# Patient Record
Sex: Male | Born: 1945 | Race: White | Hispanic: No | Marital: Single | Smoking: Former smoker
Health system: Southern US, Community
[De-identification: ages and names within clinical notes are randomized; demographics above are authoritative.]

## PROBLEM LIST (undated history)

## (undated) DIAGNOSIS — E119 Type 2 diabetes mellitus without complications: Secondary | ICD-10-CM

## (undated) DIAGNOSIS — F319 Bipolar disorder, unspecified: Secondary | ICD-10-CM

## (undated) DIAGNOSIS — E785 Hyperlipidemia, unspecified: Secondary | ICD-10-CM

## (undated) DIAGNOSIS — M1990 Unspecified osteoarthritis, unspecified site: Secondary | ICD-10-CM

## (undated) DIAGNOSIS — I2510 Atherosclerotic heart disease of native coronary artery without angina pectoris: Secondary | ICD-10-CM

## (undated) DIAGNOSIS — Z955 Presence of coronary angioplasty implant and graft: Secondary | ICD-10-CM

## (undated) DIAGNOSIS — F419 Anxiety disorder, unspecified: Secondary | ICD-10-CM

## (undated) DIAGNOSIS — D649 Anemia, unspecified: Secondary | ICD-10-CM

## (undated) DIAGNOSIS — I10 Essential (primary) hypertension: Secondary | ICD-10-CM

## (undated) DIAGNOSIS — F32A Depression, unspecified: Secondary | ICD-10-CM

## (undated) DIAGNOSIS — R011 Cardiac murmur, unspecified: Secondary | ICD-10-CM

## (undated) DIAGNOSIS — G894 Chronic pain syndrome: Secondary | ICD-10-CM

## (undated) DIAGNOSIS — G4700 Insomnia, unspecified: Secondary | ICD-10-CM

## (undated) DIAGNOSIS — F329 Major depressive disorder, single episode, unspecified: Secondary | ICD-10-CM

## (undated) DIAGNOSIS — K219 Gastro-esophageal reflux disease without esophagitis: Secondary | ICD-10-CM

## (undated) DIAGNOSIS — D509 Iron deficiency anemia, unspecified: Secondary | ICD-10-CM

## (undated) DIAGNOSIS — Z860100 Personal history of colon polyps, unspecified: Secondary | ICD-10-CM

## (undated) DIAGNOSIS — C801 Malignant (primary) neoplasm, unspecified: Secondary | ICD-10-CM

## (undated) DIAGNOSIS — N529 Male erectile dysfunction, unspecified: Secondary | ICD-10-CM

## (undated) DIAGNOSIS — E559 Vitamin D deficiency, unspecified: Secondary | ICD-10-CM

## (undated) DIAGNOSIS — E782 Mixed hyperlipidemia: Secondary | ICD-10-CM

## (undated) HISTORY — DX: Insomnia, unspecified: G47.00

## (undated) HISTORY — DX: Cardiac murmur, unspecified: R01.1

## (undated) HISTORY — DX: Hyperlipidemia, unspecified: E78.5

## (undated) HISTORY — DX: Unspecified osteoarthritis, unspecified site: M19.90

## (undated) HISTORY — DX: Mixed hyperlipidemia: E78.2

## (undated) HISTORY — DX: Anxiety disorder, unspecified: F41.9

## (undated) HISTORY — DX: Presence of coronary angioplasty implant and graft: Z95.5

## (undated) HISTORY — DX: Male erectile dysfunction, unspecified: N52.9

## (undated) HISTORY — DX: Essential (primary) hypertension: I10

## (undated) HISTORY — DX: Personal history of colon polyps, unspecified: Z86.0100

## (undated) HISTORY — DX: Depression, unspecified: F32.A

## (undated) HISTORY — DX: Atherosclerotic heart disease of native coronary artery without angina pectoris: I25.10

## (undated) HISTORY — DX: Gastro-esophageal reflux disease without esophagitis: K21.9

## (undated) HISTORY — DX: Personal history of colon polyps: Z86.010

## (undated) HISTORY — DX: Vitamin D deficiency, unspecified: E55.9

## (undated) HISTORY — DX: Bipolar disorder, unspecified: F31.9

## (undated) HISTORY — PX: CORONARY ANGIOPLASTY WITH STENT PLACEMENT: SHX49

## (undated) HISTORY — DX: Major depressive disorder, single episode, unspecified: F32.9

## (undated) HISTORY — PX: EYE SURGERY: SHX253

## (undated) HISTORY — DX: Chronic pain syndrome: G89.4

## (undated) HISTORY — DX: Anemia, unspecified: D64.9

## (undated) HISTORY — DX: Iron deficiency anemia, unspecified: D50.9

---

## 1997-12-17 ENCOUNTER — Emergency Department (HOSPITAL_COMMUNITY): Admission: EM | Admit: 1997-12-17 | Discharge: 1997-12-17 | Payer: Self-pay | Admitting: Emergency Medicine

## 1997-12-19 ENCOUNTER — Ambulatory Visit (HOSPITAL_COMMUNITY): Admission: RE | Admit: 1997-12-19 | Discharge: 1997-12-19 | Payer: Self-pay

## 2000-11-07 ENCOUNTER — Observation Stay (HOSPITAL_COMMUNITY): Admission: EM | Admit: 2000-11-07 | Discharge: 2000-11-07 | Payer: Self-pay | Admitting: Emergency Medicine

## 2000-11-07 ENCOUNTER — Encounter: Payer: Self-pay | Admitting: Emergency Medicine

## 2006-02-22 HISTORY — PX: CAROTID STENT: SHX1301

## 2006-06-03 ENCOUNTER — Inpatient Hospital Stay (HOSPITAL_COMMUNITY): Admission: RE | Admit: 2006-06-03 | Discharge: 2006-06-05 | Payer: Self-pay | Admitting: Cardiology

## 2008-06-18 ENCOUNTER — Inpatient Hospital Stay (HOSPITAL_COMMUNITY): Admission: EM | Admit: 2008-06-18 | Discharge: 2008-06-19 | Payer: Self-pay | Admitting: Emergency Medicine

## 2008-09-15 ENCOUNTER — Inpatient Hospital Stay (HOSPITAL_COMMUNITY): Admission: EM | Admit: 2008-09-15 | Discharge: 2008-09-18 | Payer: Self-pay | Admitting: Emergency Medicine

## 2008-10-08 ENCOUNTER — Encounter: Admission: RE | Admit: 2008-10-08 | Discharge: 2008-10-08 | Payer: Self-pay | Admitting: Family Medicine

## 2008-12-26 ENCOUNTER — Ambulatory Visit (HOSPITAL_COMMUNITY): Admission: RE | Admit: 2008-12-26 | Discharge: 2008-12-26 | Payer: Self-pay | Admitting: Family Medicine

## 2009-06-25 ENCOUNTER — Encounter: Admission: RE | Admit: 2009-06-25 | Discharge: 2009-06-25 | Payer: Self-pay | Admitting: Family Medicine

## 2010-05-27 LAB — GLUCOSE, CAPILLARY: Glucose-Capillary: 124 mg/dL — ABNORMAL HIGH (ref 70–99)

## 2010-05-31 LAB — BASIC METABOLIC PANEL
BUN: 3 mg/dL — ABNORMAL LOW (ref 6–23)
BUN: 7 mg/dL (ref 6–23)
CO2: 27 mEq/L (ref 19–32)
CO2: 29 mEq/L (ref 19–32)
Calcium: 8.6 mg/dL (ref 8.4–10.5)
Chloride: 111 mEq/L (ref 96–112)
Creatinine, Ser: 0.64 mg/dL (ref 0.4–1.5)
GFR calc Af Amer: >60 mL/min (ref >60)
Glucose, Bld: 103 mg/dL — ABNORMAL HIGH (ref 70–99)
Glucose, Bld: 129 mg/dL — ABNORMAL HIGH (ref 70–99)
Potassium: 3.9 mEq/L (ref 3.5–5.1)
Sodium: 139 mEq/L (ref 135–145)

## 2010-05-31 LAB — COMPREHENSIVE METABOLIC PANEL
AST: 22 U/L (ref 0–37)
Albumin: 3.5 g/dL (ref 3.5–5.2)
Alkaline Phosphatase: 69 U/L (ref 39–117)
Chloride: 106 mEq/L (ref 96–112)
GFR calc Af Amer: >60 mL/min (ref >60)
Potassium: 3.8 mEq/L (ref 3.5–5.1)
Sodium: 138 mEq/L (ref 135–145)
Total Bilirubin: 0.2 mg/dL — ABNORMAL LOW (ref 0.3–1.2)
Total Protein: 6.7 g/dL (ref 6.0–8.3)

## 2010-05-31 LAB — CBC
HCT: 25.2 % — ABNORMAL LOW (ref 39.0–52.0)
Hemoglobin: 11.1 g/dL — ABNORMAL LOW (ref 13.0–17.0)
Hemoglobin: 8.3 g/dL — ABNORMAL LOW (ref 13.0–17.0)
MCHC: 32.7 g/dL (ref 30.0–36.0)
MCHC: 32.9 g/dL (ref 30.0–36.0)
MCHC: 33.6 g/dL (ref 30.0–36.0)
MCV: 79.3 fL (ref 78.0–100.0)
MCV: 80.9 fL (ref 78.0–100.0)
Platelets: 260 10*3/uL (ref 150–400)
Platelets: 299 10*3/uL (ref 150–400)
Platelets: 337 10*3/uL (ref 150–400)
RBC: 4.29 MIL/uL (ref 4.22–5.81)
RDW: 18.9 % — ABNORMAL HIGH (ref 11.5–15.5)
RDW: 19.5 % — ABNORMAL HIGH (ref 11.5–15.5)
RDW: 19.7 % — ABNORMAL HIGH (ref 11.5–15.5)
RDW: 20 % — ABNORMAL HIGH (ref 11.5–15.5)
WBC: 10.6 10*3/uL — ABNORMAL HIGH (ref 4.0–10.5)

## 2010-05-31 LAB — ABO/RH: ABO/RH(D): O POS

## 2010-05-31 LAB — TYPE AND SCREEN: ABO/RH(D): O POS

## 2010-05-31 LAB — URINALYSIS, ROUTINE W REFLEX MICROSCOPIC
Glucose, UA: NEGATIVE mg/dL
Nitrite: NEGATIVE
Protein, ur: NEGATIVE mg/dL
Urobilinogen, UA: 0.2 mg/dL (ref 0.0–1.0)

## 2010-05-31 LAB — HEMOGLOBIN AND HEMATOCRIT, BLOOD: Hemoglobin: 11.1 g/dL — ABNORMAL LOW (ref 13.0–17.0)

## 2010-05-31 LAB — DIFFERENTIAL
Basophils Absolute: 0 10*3/uL (ref 0.0–0.1)
Basophils Relative: 0 % (ref 0–1)
Eosinophils Relative: 1 % (ref 0–5)
Monocytes Absolute: 0.5 10*3/uL (ref 0.1–1.0)
Monocytes Relative: 5 % (ref 3–12)

## 2010-05-31 LAB — CARBAMAZEPINE LEVEL, TOTAL: Carbamazepine Lvl: 6.7 ug/mL (ref 4.0–12.0)

## 2010-06-03 LAB — CBC
HCT: 26.2 % — ABNORMAL LOW (ref 39.0–52.0)
MCHC: 34.3 g/dL (ref 30.0–36.0)
MCHC: 34.8 g/dL (ref 30.0–36.0)
MCV: 83.7 fL (ref 78.0–100.0)
MCV: 85.9 fL (ref 78.0–100.0)
MCV: 86.4 fL (ref 78.0–100.0)
Platelets: 242 10*3/uL (ref 150–400)
Platelets: 317 10*3/uL (ref 150–400)
RBC: 3.38 MIL/uL — ABNORMAL LOW (ref 4.22–5.81)
RDW: 13.7 % (ref 11.5–15.5)
WBC: 7.9 10*3/uL (ref 4.0–10.5)
WBC: 8.2 10*3/uL (ref 4.0–10.5)
WBC: 9.7 10*3/uL (ref 4.0–10.5)

## 2010-06-03 LAB — POCT CARDIAC MARKERS
CKMB, poc: 2 ng/mL (ref 1.0–8.0)
Myoglobin, poc: 69.9 ng/mL (ref 12–200)
Troponin i, poc: <0.05 ng/mL (ref 0.00–0.09)

## 2010-06-03 LAB — HEMOGLOBIN A1C: Hgb A1c MFr Bld: 5.9 % (ref 4.6–6.1)

## 2010-06-03 LAB — COMPREHENSIVE METABOLIC PANEL
AST: 33 U/L (ref 0–37)
Albumin: 3.6 g/dL (ref 3.5–5.2)
Calcium: 8.8 mg/dL (ref 8.4–10.5)
Chloride: 104 mEq/L (ref 96–112)
Creatinine, Ser: 0.76 mg/dL (ref 0.4–1.5)
GFR calc Af Amer: >60 mL/min (ref >60)
Sodium: 137 mEq/L (ref 135–145)

## 2010-06-03 LAB — POCT I-STAT, CHEM 8
BUN: 12 mg/dL (ref 6–23)
Chloride: 105 mEq/L (ref 96–112)
Creatinine, Ser: 0.9 mg/dL (ref 0.4–1.5)
Glucose, Bld: 125 mg/dL — ABNORMAL HIGH (ref 70–99)
Potassium: 3.6 mEq/L (ref 3.5–5.1)

## 2010-06-03 LAB — CARDIAC PANEL(CRET KIN+CKTOT+MB+TROPI)
Relative Index: 2 (ref 0.0–2.5)
Total CK: 128 U/L (ref 7–232)
Troponin I: 0.09 ng/mL — ABNORMAL HIGH (ref 0.00–0.06)

## 2010-06-03 LAB — CK TOTAL AND CKMB (NOT AT ARMC)
CK, MB: 3.6 ng/mL (ref 0.3–4.0)
Relative Index: 1.6 (ref 0.0–2.5)

## 2010-06-03 LAB — DIFFERENTIAL
Eosinophils Relative: 2 % (ref 0–5)
Lymphocytes Relative: 23 % (ref 12–46)
Lymphs Abs: 1.8 10*3/uL (ref 0.7–4.0)
Monocytes Absolute: 0.6 10*3/uL (ref 0.1–1.0)

## 2010-06-03 LAB — BASIC METABOLIC PANEL
BUN: 7 mg/dL (ref 6–23)
CO2: 27 mEq/L (ref 19–32)
Chloride: 104 mEq/L (ref 96–112)
Creatinine, Ser: 0.74 mg/dL (ref 0.4–1.5)
Glucose, Bld: 109 mg/dL — ABNORMAL HIGH (ref 70–99)

## 2010-06-03 LAB — LIPID PANEL
Cholesterol: 178 mg/dL (ref 0–200)
LDL Cholesterol: 92 mg/dL (ref 0–99)
Triglycerides: 217 mg/dL — ABNORMAL HIGH (ref <150)
VLDL: 43 mg/dL — ABNORMAL HIGH (ref 0–40)

## 2010-06-03 LAB — TROPONIN I: Troponin I: 0.01 ng/mL (ref 0.00–0.06)

## 2010-07-07 NOTE — H&P (Signed)
NAME:  Phillip [name], Phillip [name] NO.:  [id number]   MEDICAL RECORD NO.:  [id number]          PATIENT TYPE:  INP   LOCATION:  0104                         FACILITY:  [facility]   PHYSICIAN:  Arne Cleveland, MD       DATE OF BIRTH:  [birth year]   DATE OF ADMISSION:  09/15/2008  DATE OF DISCHARGE:                              HISTORY & PHYSICAL   PRIMARY CARE PHYSICIAN:  Lillia Carmel, MD   CHIEF COMPLAINT:  Nearly passing out today.   HISTORY OF PRESENT ILLNESS:  Phillip [name] is a 65 year old Caucasian male  who went outside today to see what the weather is and felt very weak and  lightheaded.  He went back in, sat down and nearly passed out.  A friend  of his came by and checked his blood pressure, and it was very low.  They called paramedics, and he was brought to the emergency room.  He  had noticed yesterday having 3 episodes of sort of loose, dark bloody  stools.  However, he was waiting until Monday to go see his doctor.  He  had recently been seen and worked up for anemia by his  gastroenterologist, Dr. Bosie Clos, and had an endoscopy and colonoscopy  on September 02, 2008.  The endoscopy was normal, but the colonoscopy showed  that he had some polyps and diverticulosis.  He had some polypectomies  and was sent home.  Results of the polyps according to the patient were  okay, had been doing fine until yesterday.  The reason he had the  endoscopy and colonoscopy was because he was anemic and had very little  energy which was felt to be secondary to his anemia.   PAST MEDICAL HISTORY:  Significant for myocardial infarction.  He has  had 2 coronary stents, also has high cholesterol and chronic obstructive  pulmonary disease as well as the diverticulosis, colon polyps, and  anemia.   PAST SURGICAL HISTORY:  Only significant for the cardiac stents.   FAMILY HISTORY:  Father died of lung cancer at 45.  Mother died of  cancer in her 35s.  He had 4 brothers.  Two are still living.   One  brother died of brain cancer.  The other one died in an accident.  He  has 2 sisters.  One has had lung cancer, so there is a strong family  history of cancer.   SOCIAL HISTORY:  Patient is retired from [employer], educational.  He is divorced.  He has one son.  He has good family support.   REVIEW OF SYSTEMS:  Other than the recent of anemia, for which he had  the workup done and was found to have diverticulosis and colon polyps  and his recent history of coronary artery disease, his review of systems  is negative for any other findings other than those mentioned in history  of present illness.   ALLERGIES:  He has no known drug allergies.   MEDICATIONS:  He is on aspirin, ramipril, Plavix, Trileptal, Lipitor,  Tegretol, isosorbide mononitrate and Vicodin or hydrocodone  acetaminophen tablets  as needed for pain and Atarax for allergies.  The  patient states he take Tegretol for anger management and mood disorder,  and the Trileptal is also for that.  I am not sure of the dosages on  those medicines.   LABORATORIES:  He was typed and screened for 2 units and was started on  1 unit here in the emergency room.  His comprehensive metabolic panel  was normal except for a glucose of 135 mg/dL and total bilirubin of 0.2  which is slightly low, not sure what the significance of that is.  Urinalysis is negative on dipstick.  Complete blood count shows a white  count of 10,600, hemoglobin 8.4, hematocrit 26%.   PHYSICAL EXAMINATION:  GENERAL:  Patient was hypotensive __________ but  was given fluids here in the emergency room.  VITAL SIGNS:  Blood pressure was 102/58, pulse rate 98 and respirations  14.  Repeat vitals showed blood pressure 116/60, pulse 78, respirations  20, pulse oximetry 96%.  HEENT:  Examination of head is atraumatic, normocephalic.  Eyes:  Pupils  are equal, round and reactive to light.  Disks sharp.  Extraocular  muscle range of motion full.  External ear,  ear canal and tympanic  membrane appear normal.  Oropharynx mucous membranes are moist.  No  lesions are seen.  NECK:  Supple without jugular venous distention, thyromegaly or thyroid  mass.  CHEST:  Normal to inspection and palpation.  LUNGS:  Clear to auscultation and percussion.  HEART:  Regular rhythm and rate, normal S1, S2, without murmur, gallop  or rub.  ABDOMEN:  Soft, nontender with normoactive bowel sounds.  No  hepatomegaly, no splenomegaly, no palpable mass.  GENITAL:  Normal.  RECTAL:  Done by ER physician and was dark-colored stool and strongly  heme positive.  EXTREMITIES:  There is no clubbing, cyanosis or edema.  MUSCULOSKELETAL:  Basically normal.  NEUROLOGICAL:  Patient is alert, awake and oriented x3.  Cranial nerves  II-XII are intact.  Motor strength is 5/5 in upper and lower  extremities.  Sensory exam intact to light touch and pinpoint.  SKIN:  No unusual rash or lesions.  LYMPH NODE EXAM:  Cervical, axillary and cervical lymph nodes are  normal.   IMPRESSION:  1. Hypotensive/near-syncope secondary to gastrointestinal blood loss.  2. Gastrointestinal blood loss, most likely lower gastrointestinal      blood loss, either from diverticulosis or from recent polypectomy.  3. Severe anemia with hemoglobin of 8.4 in symptomatic patient.   PLAN:  Hydrate the patient.  Type and cross for 2 units.  Monitor  hemoglobin and hematocrit q.8 h.  Continue the patient on his routine  medications once we know the dosages.  Gastroenterology consult has  already been done by the ER doctor and spoke to the on-call physician  for Dr. Bosie Clos.  So, that has already been taken care of.  The patient  will be admitted to a telemetry bed.  He is being admitted to the C  Team.      Arne Cleveland, MD  Electronically Signed     ML/MEDQ  D:  09/15/2008  T:  09/15/2008  Job:  [id number]   cc:   Lillia Carmel, M.D.  Fax: [phone]

## 2010-07-07 NOTE — Discharge Summary (Signed)
NAME:  YU, PEGGS NO.:  [id number]   MEDICAL RECORD NO.:  [id number]          PATIENT TYPE:  INP   LOCATION:  2505                         FACILITY:  [facility]   PHYSICIAN:  Cristy Hilts. Jacinto Halim, MD       DATE OF BIRTH:  1946/01/24   DATE OF ADMISSION:  06/17/2008  DATE OF DISCHARGE:  06/19/2008                               DISCHARGE SUMMARY   DISCHARGE DIAGNOSES:  1. Unstable angina.  2. Hyperlipidemia.  3. Hypertension.  4. Erectile dysfunction.  5. Anxiety.  6. History of noncompliance with medications.   LABORATORIES:  On the day of discharge; his sodium was 139, potassium  3.9, BUN 7, and creatinine 0.74.  His glucose was 109.  His hemoglobin  was 9.  His hematocrit was 26.2, WBCs 8.2, and platelets 242.  CK-MB and  troponin pre-intervention, negative; post CK-MB 120 8/2.6 and troponin  of 0.03, second CK-MB 103/2.0 and troponin of 0.09.   DISCHARGE MEDICATIONS:  1. Multivitamin everyday.  2. Fish oil everyday.  3. Aspirin 325 mg everyday.  4. Lipitor 80 mg at bedtime.  5. Plavix 75 mg a day.  6. Tegretol 200 mg twice per day.  7. Xanax 0.5 mg twice a day.  8. Hydroxyzine 25 mg as needed.  9. Ramipril 2.5 mg at bedtime.  10.Hydrocodone and acetaminophen as needed.  11.Bystolic 5 mg everyday.  12.Nitroglycerin 1/150 under tongue every 5 minutes x3 when needed.  13.Imdur 30 mg everyday.   He should hold his Viagra, he should not take that any more, to discuss  this with Dr. Jacinto Halim when he returns to see him in the office on Jul 04, 2008.  He should do strenuous activity of lifting, pushing, pulling, or  exercise x1 week.  He should not drive for 1 day.  If has any problems  with his groin, he knows to give our office a call.   HOSPITAL COURSE:  Mr. Lienau is a 65 year old male with prior known  history of coronary artery disease.  In April 2008, he underwent cardiac  catheterization.  He had a Cypher stent placed to his ramus.  He had not  been  following up at the cardiologist's office.  He recently stopped his  Bystolic, which he ran out.  He started having chest pain and his blood  pressure went up, and he came to the emergency room.  He was admitted.  It was decided, he should undergo cardiac catheterization, this was  performed by Dr. Garen Lah on June 18, 2008.  He had a left main with  40% ostial, LAD 60% proximal and 60% mid.  Diagonal-1 and diagonal-2  without significant disease.  Ramus intermedius was 30% in-stent  restenosis proximal, 20% mid left circumflex, 30% proximal.  OM1 with  80% proximal lesion.  RCA 30% proximal lesion and 40% mid lesion.  His  EF was 55%.  Dr. Garen Lah consulted with Dr. Tresa Endo and it was decided  that he should undergo intervention of his OM, but at first he would  need IV ultrasound of his left main, this in  fact did show a 40% plaque.  Please see Dr. Landry Dyke note for complete details.  He went on to have a  2.5 x 13 DES Cypher stent to his left circumflex OM.  The following day  on June 19, 2008, he was seen by Dr. Mariah Milling and considered stable for  discharge home.  He was having some mild chest pain, but his EKG was  unchanged and he was placed on Imdur at the time of discharge.  He was  told not to take any more Viagra.  His blood pressure was 134/62 and  heart rate was 78.  At the time of discharge, he was afebrile.      Lezlie Octave, N.P.      Cristy Hilts. Jacinto Halim, MD  Electronically Signed    BB/MEDQ  D:  06/19/2008  T:  06/20/2008  Job:  [id number]   cc:   Aida Puffer

## 2010-07-07 NOTE — Consult Note (Signed)
NAME:  Phillip [name], Phillip [name] NO.:  [id number]   MEDICAL RECORD NO.:  [id number]          PATIENT TYPE:  INP   LOCATION:  1443                         FACILITY:  [facility]   PHYSICIAN:  Graylin Shiver, M.D.   DATE OF BIRTH:  Jun 26, 1945   DATE OF CONSULTATION:  09/16/2008  DATE OF DISCHARGE:                                 CONSULTATION   REASON FOR CONSULTATION:  The patient is a 65 year old male, status post  colonoscopy on September 02, 2008, with removal of several small 1-5 mm  polyps from the rectum and rectosigmoid region.  He presented to the  emergency room yesterday with a history of rectal bleeding which began  the day before and syncope.  On the day of presentation, his hemoglobin  and hematocrit in the emergency room were 8.4 and 26 respectively.  He  received 1 unit of packed red cells and today his hemoglobin is 8.3 and  25.2.  The patient states that the bleeding is slowing down a lot.  The  patient had been on Plavix due to a history of coronary stents.  He  remain on Plavix for the colonoscopy because his cardiologist said he  could not come off of it because one of the stents was placed within the  past year and because of fear of occluding the stent he remained on  Plavix.  The patient was also found to have diverticulosis during his  colonoscopy.  He also had an EGD which was normal except for a hiatal  hernia.   The patient is currently doing much better and he currently feels fine.  He did have a bowel movement this morning which was a dark maroonish  clotty looking stool, but the marked red bleeding that he had has slowed  down considerably   PAST HISTORY:  History of myocardial infarction.  He has coronary  stents.   SYSTEMS REVIEWED:  Not complaining of any chest pain or shortness of  breath, negative except for above.   ALLERGIES:  NONE KNOWN.   MEDICATIONS PRIOR TO ADMISSION:  Plavix, ramipril, Trileptal, Lipitor,  Tegretol, isosorbide  mononitrate, Vicodin, hydrocodone, acetaminophen,  p.r.n. Atarax.   PHYSICAL EXAMINATION:  GENERAL:  He is alert and oriented.  He is in no  distress.  VITAL SIGNS:  Stable.  HEART:  Regular rhythm.  No murmurs.  LUNGS:  Clear.  ABDOMEN:  Soft, nontender.   IMPRESSION:  Lower gastrointestinal bleeding, probably secondary to post  polypectomy versus diverticular.   PLAN:  Continue to observe, transfuse 2 units of packed red cells today.  Follow H and H.  Hopefully therapeutic intervention will not be needed,  in other words sigmoidoscopy with control of bleeding by injection and  cautery or clipping.  Things seem to be slowing down.  We will observe  him and if therapeutic intervention is needed, we will be available for  that.           ______________________________  Graylin Shiver, M.D.     SFG/MEDQ  D:  09/16/2008  T:  09/16/2008  Job:  [id number]  cc:   [facility]   Shirley Friar, MD  Fax: [phone]

## 2010-07-07 NOTE — Discharge Summary (Signed)
Phillip [name], Phillip [name]                ACCOUNT NO.:  [id number]   MEDICAL RECORD NO.:  [id number]          PATIENT TYPE:  INP   LOCATION:  1443                         FACILITY:  [facility]   PHYSICIAN:  Hillery Aldo, M.D.   DATE OF BIRTH:  1945/08/17   DATE OF ADMISSION:  09/15/2008  DATE OF DISCHARGE:  09/18/2008                               DISCHARGE SUMMARY   PRIMARY CARE PHYSICIAN:  Dr. Saunders Glance Hilts.   DISCHARGE DIAGNOSES:  1. Presyncope.  2. Lower gastrointestinal bleed, status post polypectomy.  3. Coronary artery disease with drug eluting stents  4. Hypertension.  5. Dyslipidemia.  6. Chronic obstructive pulmonary disease.  7. Diverticulosis.  8. History of anemia.  9. Erectile dysfunction.  10.History of medical nonadherence.  11.Right upper lobe and left upper lobe nodules, follow-up      recommended.   DISCHARGE MEDICATIONS:  1. Multivitamin 1 tablet p.o. daily.  2. Fish oil 1000 mg p.o. daily.  3. Aspirin 325 mg p.o. daily.  4. Lipitor 80 mg p.o. q.h.s.  5. Plavix 75 mg p.o. daily.  6. Tegretol 200 mg p.o. b.i.d.  7. Xanax 0.5 mg p.o. b.i.d.  8. Hydroxyzine 25 mg p.o. q.6 h p.r.n. allergies  9. Ramipril 2.5 mg p.o. q.h.s.  10.Bystolic 5 mg p.o. daily.  11.Imdur 30 mg p.o. daily.  12.Nitroglycerin 0.4 mg sublingual q. 5 minutes p.r.n. chest pain.  13.Hydrocodone 5 mg p.o. q.4 h p.r.n. pain.  14.Tylenol 650 mg p.o. q.6 h p.r.n. mild pain or fever.   CONSULTATIONS:  1. Dr. Evette Cristal of gastroenterology.  2. Dr. Sheliah Mends of cardiology.   BRIEF ADMISSION HPI:  The patient is a 65 year old male with known  coronary artery disease who is on aspirin and Plavix secondary to a  history of drug eluting stents, who presented to the hospital with a  presyncopal episode.  Apparently a friend came by and checked his blood  pressure and noted that it was low and subsequently called the  paramedics, where he was brought to the emergency room for evaluation.  He also gave  a history of having three episodes of loose, dark, bloody  stool in the past 24 hours in the setting of a recent GI evaluation done  by Dr. Bosie Clos with both upper and lower endoscopies done on September 02, 2008.  The endoscopy was apparently normal, but the colonoscopy did show  some colonic polyps and diverticulosis and he subsequently had  polypectomy done and was sent home.  Upon initial evaluation in the  emergency department, he was noted to have a hemoglobin of 8.3 and  subsequently was referred to the hospitalist service for further  evaluation and workup.  For full details, please see the dictated report  done by Dr. Shawnie Dapper.   PROCEDURES AND DIAGNOSTIC STUDIES:  1. Acute abdominal series on September 16, 2008 showed no acute or specific      abdominal findings.  Possible small right upper lobe nodule, which      appears to be a new finding.  Probable small left upper lobe  nodule, which was present previously.   DISCHARGE LABORATORY VALUES:  White blood cell count was 7.1, hemoglobin  11.1, hematocrit 33.2, platelets 299.   HOSPITAL COURSE BY PROBLEM:  1. Lower GI bleed, status post polypectomy:  The patient was admitted      and serial hemoglobin and hematocrit checks were done.  Because of      his recent polypectomy, GI consultation was requested and kindly      provided by Dr. Evette Cristal.  The patient was given 2 units of packed red      blood cells and the plan was to perform a sigmoidoscopy if there      was evidence of ongoing bleeding.  The patient's hemoglobin and      hematocrit stabilized after 2 units of packed red blood cells with      no evidence of ongoing bleeding and therefore no further diagnostic      evaluation was done.  He was cleared for discharge by Dr. Evette Cristal and      will remain on aspirin and Plavix.  2. Coronary artery disease with history of drug eluting stents:  The      patient has been maintained on his usual medical therapies,      including aspirin  and Plavix, which recommend continuing.  3. Hypertension:  The patient's blood pressure is well-controlled.  4. Dyslipidemia:  The patient was maintained on statin therapy.  5. Bilateral lung nodules:  The patient had an acute abdominal series      which did show a new small right upper lobe nodule, as well as an      old left upper lobe nodule.  We do recommend follow-up imaging to      ensure that these remain stable.  This can be done by his primary      care physician.   DISPOSITION:  The patient is medically stable and can be discharged  home.  He is encouraged to follow up with Dr. Prince Rome in 1-2 weeks, or as  needed.   Time spent coordinating care for discharge and discharge instructions  equals 25 minutes.      Hillery Aldo, M.D.  Electronically Signed     CR/MEDQ  D:  09/18/2008  T:  09/18/2008  Job:  [id number]   cc:   Lillia Carmel, M.D.  Fax: [phone]

## 2010-07-07 NOTE — Consult Note (Signed)
Phillip [name], Phillip [name]                ACCOUNT NO.:  [id number]   MEDICAL RECORD NO.:  [id number]          PATIENT TYPE:  INP   LOCATION:  1443                         FACILITY:  [facility]   PHYSICIAN:  Sheliah Mends, MD      DATE OF BIRTH:  [birth year]   DATE OF CONSULTATION:  DATE OF DISCHARGE:                                 CONSULTATION   REASON FOR CONSULTATION:  Coronary artery disease.   HISTORY OF PRESENT ILLNESS:  Phillip [name] is a 65 year old gentleman with  history of coronary artery disease.  He presented with acute coronary  syndrome in April 2010 and underwent coronary angiography at that time.  He was found to have a 40% ostial left main, a 60% proximal LAD and a  60% mid-LAD lesion as well as a 40% in-stent restenosis in the ramus  intermedius, a 20% mid left circumflex as well as a 80% proximal obtuse  marginal lesion and a 30% proximal RCA lesion as well as a 40% mid  lesion.  He subsequently underwent drug-eluting stent placement with Dr.  Daphene Jaeger on June 18, 2008, to the obtuse marginal branch.  He did  receive a 2.5 x 13 mm drug-eluting Cypher stent.  The patient was chest  pain free after his stent placement and was started on dual-antiplatelet  therapy with aspirin and Plavix for a minimum of one year.   The patient apparently underwent colonoscopy and endoscopy on September 02, 2008, in context of an anemia workup.  Medical records pertaining to  this procedure are currently unavailable to me. At that time, he  underwent a polypectomy.  It is unclear to me whether he was at that  time on Plavix or whether Plavix was held.  I am unsure whether the  colonoscopy procedure was an elective procedure or an emergent  procedure.   Subsequently, Phillip [name] presented on September 15, 2008, with near syncope,  lightheadedness and weakness.  He noted three episodes of dark bloody  stools and was found to have a hematocrit of 26.  His baseline  hematocrit in April was 40%.  He received  2 units of PRBC on September 16, 2008, and his aspirin and Plavix were held.   From a cardiac standpoint, [name] is currently asymptomatic.  He  denies shortness of breath and chest pain.   PAST MEDICAL HISTORY:  1. Coronary artery disease status post stent placement to the obtuse      marginal branch of the left circumflex in April 2010 and prior      stent placement to the ramus intermedius artery in 2002.  2. Hypertension.  3. Dyslipidemia.  4. History of anemia.  5. Erectile dysfunction.   ALLERGIES:  NO KNOWN DRUG ALLERGIES.   SOCIAL HISTORY:  The patient is divorced, he has one son.  He is retired  and used to work in the tobacco and history.   FAMILY HISTORY:  The patient's father died of lung cancer at age 39.  The patient's mother died of cancer in her 54s.  He has four brothers,  one  died of brain cancer.  The other one died in an accident.  Two  brothers are alive.  He has two sisters and one of them has a history of  lung cancer.   REVIEW OF SYSTEMS:  Review of system is negative from a cardiac  standpoint.   OUTPATIENT MEDICATIONS:  1. Lipitor 80 mg p.o. q.h.s.  2. Plavix 75 mg p.o. daily.  3. Tegretol 2 mg b.i.d.  4. Xanax 0.5 mg p.o. twice daily.  5. Hydroxyzine 25 mg as needed.  6. Ramipril 2.5 mg at bedtime.  7. Hydrocodone and Tylenol as needed.  8. Bystolic 5 mg daily.  9. Nitroglycerin sublingual as needed.  10.Imdur 40 mg p.o. daily.   PHYSICAL EXAMINATION:  GENERAL:  The patient is alert and oriented x3.  VITAL SIGNS:  Blood pressure 147/68, heart rate 72, temperature 97.6,  respiratory rate 16.  NECK:  Supple.  Normal JVP.  No carotid bruit.  CHEST/LUNGS:  Clear to auscultation bilaterally.  No rales or wheezes.  HEART:  Regular rate and rhythm.  No rub, murmur, gallop.  ABDOMEN:  Soft, nontender, nondistended.  Positive bowel sounds.  EXTREMITIES:  No edema.   STUDIES:  EKG shows sinus bradycardia with first degree AV block and  small  nonsignificant Q-waves in 3 and AVR and no significant ST-T  segment changes.   IMPRESSION:  1. Acute GI bleed requiring transfusions of 2 units of PRBC.  2. Coronary artery disease status post drug-eluting stent placement to      large obtuse marginal branch in April 2010.  3. Hypertension.  4. Dyslipidemia.  5. Obesity.   RECOMMENDATIONS:  Phillip [name] is 3 months out from drug eluding stent  placement to large obtuse marginal branch and presents now with a  gastrointestinal bleed after undergoing polypectomies in context of an  anemia workup and colonoscopy.  He was taken off dual antiplatelet  therapy by the admitting physician.  [name] has a significant risk  for in stent thrombosis and should be restarted on aspirin and Plavix at  the earliest possible time point.  He needs to remain on Plavix based on  guidelines for minimum of a year, although one could attempt and  discontinue Plavix 6 months after the stent placement.  For the time  being, it is extremely important to restart the patient on dual  antiplatelet therapy at the earliest possible time point.  In the  meantime, he should receive supportive care for his GI bleed including  transfusions to keep his hematocrit at 30% and above.  Should the  patient develop new onset of cardiac symptoms such as chest pain and  shortness of breath, he will require aggressive management, including  possibly repeat of his invasive workup to check for stent thrombosis.   Thank you for this interesting consult.  Please do not hesitate to  contact me should you have any questions or concerns.  Thank you for  allowing me to assist in the care of this gentleman.      Sheliah Mends, MD  Electronically Signed     JE/MEDQ  D:  09/16/2008  T:  09/17/2008  Job:  [id number]

## 2010-07-07 NOTE — Cardiovascular Report (Signed)
NAME:  Phillip [name], Phillip [name] NO.:  [id number]   MEDICAL RECORD NO.:  [id number]          PATIENT TYPE:  INP   LOCATION:  2505                         FACILITY:  [facility]   PHYSICIAN:  Sheliah Mends, MD      DATE OF BIRTH:  05/22/1945   DATE OF PROCEDURE:  06/18/2008  DATE OF DISCHARGE:                            CARDIAC CATHETERIZATION   INDICATION:  Phillip [name] is a 65 year old gentleman with history of  coronary artery disease, status post stent placement to the ramus  intermedius in 2008 who presented to [facility] with new onset  of chest pain.  His initial evaluation revealed negative point of care  troponins and normal resting EKG.  Given his prior history, clinical  presentation, and multiple risk factors, decision was made to proceed to  cardiac catheterization.   PROCEDURE:  After informed consent was obtained, the patient was brought  to the Second Floor Cardiac Catheterization Lab at [facility]  in a postabsorptive state.  He was draped in sterile fashion and started  on conscious sedation using Versed and fentanyl.  Local anesthesia is  using, 1% Xylocaine was applied to the right groin.  A 5-French arterial  sheath was inserted into the right femoral artery using the modified  Seldinger technique.  Subsequently selective coronary angiography using  the right and left Judkins 5 catheter was performed.  Left  ventriculogram was performed using a pigtail catheter.   FINDINGS:  Left main artery:  The left main artery has a 40% ostial  lesion.  The reminder of the vessel shows mild irregularities.  The left  main trifurcates into the LAD, ramus intermedius, and left circumflex  artery.   LAD:  The left anterior descending artery is a medium caliber, long  vessel.  There is a 60% proximal lesion noted.  In addition, there is a  60% midsegment lesion between the takeoff of the first and second  diagonal artery.  The LAD gives off two medium-sized  diagonal arteries  without significant disease.   Ramus intermedius:  The ramus intermedius is a medium-size vessel.  There is a stent seen in the proximal segment of the ramus intermedius.  There is 40% in-stent restenosis in the proximal segment of stent.  In  addition, there is a 20% lesion seen in the mid segment of the ramus  intermedius.   Left circumflex artery:  The left circumflex artery is a medium-size  vessel.  It gives off a large caliber obtuse marginal branching.  There  is an 80% lesion in the proximal segment of the obtuse marginal 1.   Right coronary artery:  The right coronary artery is a medium-sized  vessel.  It gives off PDA.  There is a 40% proximal lesion and 40%  lesion in the mid segment.  There is no hemodynamic significant lesion  seen.   HEMODYNAMICS:  Left ventricular pressure 94/7, aortic pressure 92/59  mmHg.  There is no significant aortic valve gradient seen.   Left ventriculogram:  The left ventriculogram shows normal left  ventricular function with an ejection fraction of  55%.  There is very  mild inferolateral hypokinesis noted.   SUMMARY:  Multivessel coronary artery disease with mild left main artery  disease, moderate LAD disease and a severe disease in the left  circumflex territory.  The patient will proceed with percutaneous  coronary intervention to  the obtuse marginal branch and intravascular ultrasound.  The diagnostic  portion of the procedure was completed without complication.  Dr. Nicki Guadalajara will perform the interventional part of the procedure.  The  patient will require very aggressive risk factor management.      Sheliah Mends, MD  Electronically Signed     JE/MEDQ  D:  06/18/2008  T:  06/18/2008  Job:  [id number]

## 2010-07-07 NOTE — Cardiovascular Report (Signed)
NAME:  Phillip [name], Phillip [name] NO.:  [id number]   MEDICAL RECORD NO.:  [id number]          PATIENT TYPE:  INP   LOCATION:  2505                         FACILITY:  [facility]   PHYSICIAN:  Nicki Guadalajara, M.D.     DATE OF BIRTH:  1945-09-12   DATE OF PROCEDURE:  DATE OF DISCHARGE:                            CARDIAC CATHETERIZATION   INDICATIONS:  [name] is a 65 year old gentleman who is status  post prior intervention to his ramus intermediate vessel by Dr. Jacinto Halim in  April 2008.  He had presented to [facility] with intermittent  recurrent episodes of chest pain.  Diagnostic cardiac catheterization  was performed this morning by Dr. Gevena Barre, which revealed  progressive CAD with now 80% stenosis in the circumflex marginal vessel.  He also had some moderate LAD disease.  I was asked to evaluate the  patient for consideration of percutaneous coronary intervention of his  circumflex lesion.   PROCEDURE:  The patient's arterial sheath was in place from the  diagnostic procedure.  He received an additional 2 mg of Versed and 25  mg of fentanyl for additional sedation.  Double gloves were used and 5-  [place] arterial sheath was exchanged for a 6-French sheath.  Bivalirudin  was administered for anticoagulation.  The patient was on chronic Plavix  and received an additional 150 mg of oral Plavix in the Laboratory.  Initially, a Voda 4 catheter was inserted, but it was apparent that this  would not be able to engage the left main and this was then removed and  exchanged for an FL-4.  Prior to engaging the left main, due to some  concern of ostial narrowing of the left main seen on my review of the  diagnostic images, I did perform several scout shots to look at the  ostium of the left main without the catheter in place.  This did confirm  an eccentric inferior stenosis.  Consequently, prior to performing  percutaneous coronary prevention, I elected to proceed  with  intravascular ultrasound of the left main to make certain this was not a  significant stenosis and if that was the case the patient would require  a CBG revascularization surgery.  ACT was documented to be therapeutic.  The patient also received IC nitroglycerin.  A wire was advanced down  into the circumflex vessel and the [facility] [employer] intravascular  ultrasound was then inserted.  The left main was IVUS'ed on pullback  which confirmed crescent eccentric plaque in the 9-12 o'clock position  and percent area narrowing was approximately 40%.  It was then felt  suitable to proceed with intervention to the circumflex marginal branch.  The intravascular ultrasound was then removed.  The ATW marker wire was  advanced into the circumflex marginal vessel.  IC nitroglycerin was  administered.  Primary stenting was done with a 2.5 x 13 mm drug-eluting  Cypher stent with 2 dilatations at 13 and 15 atmospheres, respectively.  A 2.75 noncompliant sprinter balloon was used for post-stent dilatation  up to 2.77 mm.  Scout angiography confirmed an excellent angiographic  result.  Since I had already used the intravascular ultrasound and since  the patient did have 2 areas of moderate stenosis in the LAD, I elected  to perform intravascular ultrasound to further evaluate the mid LAD  lesion at the site where 2 diagonals arose as well as the proximal near  ostial LAD lesion.  The ATW wire was then advanced down the LAD.  IC  nitroglycerin was again administered.  The intravascular ultrasound  catheter was then advanced into the mid LAD beyond the diagonal branches  which arose in the region of the stenosis.  A pullback was then  performed.  There was evidence for moderate plaque buildup in the region  of the LAD around the diagonal vessels with narrowing of just less than  60% with percent area narrowing of 50-60%.  The catheter was then pulled  back into the proximal LAD where there was more  concentric stenosis and  this narrowed up to approximately 60% percent area narrowing.  The  patient tolerated the procedure well.  He received additional sedation  during the procedure and also had been started on intravenous  nitroglycerin titrated up to 30 mcg.  The arterial sheath was sutured in  place with plans for sheath removal later today.   HEMODYNAMIC DATA:  Central aortic pressure was 105/67.   ANGIOGRAPHIC FINDINGS:  Please refer to Dr. Gretta Began diagnostic  cardiac catheterization report.  Again, at the start of the  interventional procedure, intravascular ultrasound was performed of the  left main which confirmed crescent-shaped eccentric stenosis in the 9-11  o'clock quadrant as well as ostially in the 3-6 o'clock quadrant.  Percent area stenosis was 40%.   The circumflex vessel had ostial narrowing of 30% proximally.  The OM-1  vessel had 80% narrowing.  Following insertion of a 2.5 x 13 mm drug-  eluting Cypher stent, postdilated to 2.77 mm with an [place] sprinter  balloon.  The 80% stenosis was reduced to 0%.  There was brisk TIMI III  flow.  There was no evidence for dissection.  The mid LAD following  intravascular ultrasound was found to have a 50-60% area of stenosis and  the proximal LAD was found to have a 61% percent area stenosis.   IMPRESSION:  1. Successful percutaneous coronary intervention/stenting of the left      circumflex first obtuse marginal artery vessel with an 80% stenosis      being reduced to 0% done with Angiomax/Plavix/intracoronary and      intravenous nitroglycerin.  2. Intravascular ultrasound confirmation of approximately 40% ostial      left main stenosis; 50% mid left anterior descending stenosis and      60% proximal left anterior descending stenosis.   The patient underwent successful intervention to high-grade circumflex  lesion.  He will be aggressively managed with medical therapy as well as  aggressive lipid therapy with probable  institution of combination  treatment.  He will be followed by Dr. Jacinto Halim with plans for probable  subsequent stress study on medical therapy to ascertain potential LAD  ischemia, aggressive treatment.           ______________________________  Nicki Guadalajara, M.D.     TK/MEDQ  D:  06/18/2008  T:  06/19/2008  Job:  [id number]   cc:   Sheliah Mends, MD  Aida Puffer

## 2010-07-10 NOTE — Discharge Summary (Signed)
[place]. [name]  Patient:    Phillip [name], Phillip [name] Visit Number: [id number] MRN: [id number]          Service Type: MED Location: 2000 2020 01 Attending Physician:  Phillip [name] Dictated by:   Phillip [name], P.A. Admit Date:  11/07/2000 Discharge Date: 11/07/2000   CC:         Phillip [name], M.D., [facility]. [facility]  Bruce Elvera Lennox Phillip [name], M.D. [facility]   Discharge Summary  DISCHARGE DIAGNOSES: 1. ______ status post cardiac catheterization. 2. Guaiac positive stools. 3. Anxiety/depression. 4. Tobacco abuse.  HOSPITAL COURSE:  Phillip [name] is a 65 year old male with no prior cardiac history. He presented to the emergency room at [name] reporting several weeks of progressive chest pain. While at work on the day of admission, the patient developed dull anterior chest pull which he rated at 8/10. There was no radiation of the pain and there was no question about whether it was associated with increased shortness of breath. There was no associated nausea, vomiting or diaphoresis. The pain lasted approximately 15 minutes. He was seen by the nurse at work who drove him to [facility]. He was seen and admitted by Dr. Olga [name]. Dr. Jens [name] noted that the patients troponin I was elevated to 0.48 and felt that the best course of action would be to have the patient undergo cardiac catheterization.  On that day, the patient was taken to the cath lab by Dr. Charlies [name]. CATHETERIZATION RESULTS: 1. Left anterior descending coronary artery: Normal. 2. Circumflex system:  A 60% lesion of the ramus, 40% distal lesion. 3. Right coronary artery normal. 4. Left ventricle:  Question of mild global hypokinesis, ejection fraction    approximately 50%.  Dr. Juanda [name] felt that was no clear source for patient for ischemia. He also felt that troponin was probably falsely elevated. He recommended the patient go home on increased Zantac as well as daily low dose  aspirin. After the appropriate wait period, the patient was felt to be stable for discharge.  DISCHARGE MEDICATIONS: 1. Paxil 20 mg q.d. 2. Zantac 150 mg b.i.d. 3. Benadryl as needed. 4. Enteric coated aspirin 81 mg q.d.  DISCHARGE INSTRUCTIONS: 1. The patient is advised to avoid driving, heavy lifting or tub baths    for two days. 2. He was instructed to follow a low fat diet. 3. He is advised to watch the cath site for any pain, bleeding, or swelling    and to call the [facility] for any of these problems. 4. He is advised to stop smoking. 5. He is to contact [facility] [name] for an appointment within the next 10 days    for follow-up on heme positive stool as well as a check of the patients    cholesterol.  LABORATORY DATA:  Sodium 139, potassium 4.5, chloride 107, CO2 26, BUN 12 and 0.8. Total CK 74, MB 1.2, troponin I 0.48. White count 9.9, hemoglobin 12.3, hematocrit 36.3, MCV 78.1, RDW 15.6, platelets 368.  Electrocardiogram showed normal sinus rhythm at 78 with a normal axis. There were also noted to be some signs of early repolarization. Dictated by:   Phillip [name], P.A. Attending Physician:  Phillip [name] DD:  11/07/00 TD:  11/08/00 Job: [id number]

## 2010-07-10 NOTE — Cardiovascular Report (Signed)
[place]. [facility]  Patient:    Phillip [name], Phillip [name] Visit Number: [id number] MRN: [id number]          Service Type: MED Location: 2000 2020 01 Attending Physician:  Glennon Hamilton Dictated by:   Everardo Beals Juanda Chance, M.D. [facility] Proc. Date: 11/07/00 Admit Date:  11/07/2000   CC:         Buren Kos, M.D.  Madolyn Frieze Jens Som, M.D. 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  He does have a long history of smoking and an alcohol history.  He came to the emergency room today complaining of left-sided throbbing chest pain lasting about 15 minutes.  His troponin was positive at 0.4 and he was seen in consultation by Dr. Jens Som who arranged for him to come in the hospital for evaluation and catheterization.  He did have a guaiac-positive stool.  DESCRIPTION OF PROCEDURE:  The procedure was performed via the right femoral artery using an arterial sheath and 6-French preformed coronary catheters.  A front wall arterial puncture was performed.  Omnipaque contrast was used.  The right femoral artery was closed with Perclose at the end of the procedure. The patient tolerated the procedure well and left the laboratory in satisfactory condition.  RESULTS: 1. The left main coronary artery:  The left main coronary artery was free of    significant disease. 2. The left anterior descending artery:  The left anterior    descending artery gave rise to a septal perforator, two diagonal branches,    and two more septal perforators.  These and the LAD proper were free of    significant disease. 3. The circumflex artery:  The circumflex artery gave rise to a large    intermedius branch, an atrial branch, two small marginal branches, and two    posterolateral branches.  There was 60-70% narrowing in the proximal    portion of the intermedius branch.   There was 40% narrowing in the distal    circumflex artery. 4. The right coronary artery:  The right coronary artery is a moderate sized    vessel that gave rise to a conus branch, a right ventricular branch, a    posterior descending branch, and a posterolateral branch.  There were some    irregularities in the right coronary artery with no significant blockage.  Left ventriculogram:  The left ventriculogram, performed in the RAO projection, showed questionable mild global hypokinesis.  The estimated ejection fraction was 60%.  There were no focal wall motion abnormalities.  The aortic pressure was 115/51 with a mean of 71.  Left ventricular pressure was 115/10.  CONCLUSION:  Nonobstructive coronary artery disease with 60-70% stenosis in the intermedius branch of the circumflex artery, 40% narrowing in the distal circumflex artery, and irregularities in the right coronary artery with questionable mild global hypokinesis.   RECOMMENDATIONS:  There was no source of ischemia.  In view of these findings, I think the abnormal troponin is probably a false positive value.  I discussed the situation with Dr. Jens Som, and we think we can let him go home today. He does have positive stools, and I will try and speak with Dr. Windle Guard to have him follow up with him and he can decide about further evaluation. Dictated by:   Everardo Beals Juanda Chance, M.D. [facility] Attending Physician:  Glennon Hamilton DD:  11/07/00 TD:  11/07/00 Job: [id number]

## 2010-07-10 NOTE — Cardiovascular Report (Signed)
NAME:  Phillip [name], Phillip [name] NO.:  [id number]   MEDICAL RECORD NO.:  [id number]          PATIENT TYPE:  OIB   LOCATION:  2807                         FACILITY:  [facility]   PHYSICIAN:  Cristy Hilts. Jacinto Halim, MD       DATE OF BIRTH:  Aug 08, 1945   DATE OF PROCEDURE:  06/03/2006  DATE OF DISCHARGE:                            CARDIAC CATHETERIZATION   REFERRING PHYSICIAN:  Dr. Aida Puffer   PROCEDURE PERFORMED:  1. Left ventriculography.  2. Selective right and left coronary arteriography.  3. Left subclavian arteriography with visualization of LIMA.  4. Intravascular ultrasound-guided interrogation and PTCA and stenting      of the ramus intermediate branch.   INDICATIONS:  Mr. Phillip [name] is a 65 year old gentleman with history  of tobacco use who has been complaining of exertional chest discomfort.  He had undergone a CT angiography of the chest on May 19, 2006, and  this had revealed a high-grade proximal LAD stenosis.  Given his ongoing  chest pain in spite of being on medical therapy, we decided to bring him  to the cardiac catheterization laboratory for definite delineation of  his coronary anatomy.   HEMODYNAMIC DATA:  The left ventricular pressure 118/2 with an end-  diastolic pressure of 6 mmHg.  The aortic pressures were 116/67 with a  mean of 88 mmHg.  There was no pressure gradient across the aortic  valve.   ANGIOGRAPHIC DATA:  Left ventricle:  Left ventricular systolic function  was normal with ejection fraction of 50-55%.  There was mild global  hypokinesis.   Right coronary artery:  Right coronary artery is a large caliber vessel  and a dominant vessel.  There is a proximal 30% and a mid 40% eccentric  stenosis.  The RCA continues as a PDA.   Left main:  The left main is a large caliber vessel.  There is an ostial  10% stenosis.  There is mild calcification noted.   Circumflex:  Circumflex is a moderate caliber vessel.  It has got mild  diffuse luminal  irregularity.  The mid to distal segment has a 30%  stenosis.   Ramus intermediate:  The ramus intermediate is a large caliber vessel.  It has a got a very highly complex ostial long segment 90% stenosis.  Otherwise, the ramus intermediate is a smooth vessel distally.   LAD:  The LAD is a large caliber vessel.  It gives origin to a small- to  moderate-sized diagonal one and diagonal two from the mid segments.  At  this segment there is a 50% stenosis in the LAD.   Left subclavian artery and LIMA:  Left subclavian artery and LIMA widely  patent.   INTERVENTION DATA:  Successful intravascular ultrasound-guided  interrogation of the ramus intermediate branch of the left coronary  artery with implantation of a 3.0 x 28-mm Cypher stent which was  deployed at 12 atmospheres of pressure.  This stent was postdilated with  a 3.25 x 20-mm Quantum at 20 atmospheres of pressure.  Post balloon  angioplasty IVUS interrogation revealed excellent wall opposition.  There  was excellent capture of the ostium of the ramus intermediate.   RECOMMENDATIONS:  The patient had has a successful PTCA and stenting of  a high-grade highly complex ramus intermediate branch of the left  coronary system.  He needs very aggressive risk modification.  He has  severe plaque burden of the right coronary artery and also of the  circumflex and LAD.  He needs to be on aggressive lipid-lowering therapy  along with aspirin.  He needs Plavix at least for a period of 2 years or  probably much longer.   A total of 280 mL of contrast was utilized for diagnostic angiography.   TECHNIQUE OF THE DIAGNOSTIC CARDIAC CATHETERIZATION:  Under usual  sterile precautions using a 6-French right femoral arterial access, a 6-  [place] multipurpose B2 catheter was utilized to perform left  ventriculography and angiography of the right coronary artery and left  main coronary artery.  Hemodynamics across the aortic valve were also   monitored.  The same catheter was utilized in the left subclavian artery  and left subclavian arteriography with visualization of the LIMA was  performed.  Then the catheter was pulled out of body.   TECHNIQUE OF INTERVENTION:  Using Angiomax for anticoagulation and using  a 0.014-inch ATW guidewire and using Atlantis intravascular ultrasound  catheter, careful IVUS interrogation of the ramus intermediate was  performed.  The lesion length and morphology was carefully analyzed.  There was a high-grade stenosis.  The lesion was abutting the IVUS  catheter.  Then a 3.0 x 15-mm cutting balloon was utilized and multiple  cuts were made to the ramus intermediate at 6-10 atmospheres of pressure  from 45-60 seconds.  I decided to proceed with stenting because of  residual stenosis and a 3.0 x 28-mm Cypher stent was advanced into the  ramus intermediate and the stent was deployed at 16 atmospheres of  pressure for 52 seconds.  After stent implantation, a 3.25 x 20-mm  Quantum balloon was utilized and balloon angioplasty at 12 and 14  atmospheres of pressures were performed for 30 seconds.  Post balloon  angioplasty IVUS interrogation revealed inadequate opposition,  especially in the mid segment of the vessel.  I went back with the same  balloon and a second two inflations at 20 atmospheres of pressure for 40  seconds each was performed.  Post balloon angioplasty again IVUS  interrogation was performed, revealing excellent results.  Then the  guidewire was withdrawn, angiography repeated, guide catheter pulled out  of body.  During the procedure, intracoronary nitroglycerin was also  administered.  The patient tolerated the procedure well.  He did have  chest pain without any EKG changes.      Cristy Hilts. Jacinto Halim, MD  Electronically Signed     JRG/MEDQ  D:  06/03/2006  T:  06/03/2006  Job:  [id number]   cc:   Aida Puffer

## 2010-07-10 NOTE — Discharge Summary (Signed)
NAME:  Phillip [name], Phillip [name] NO.:  [id number]   MEDICAL RECORD NO.:  [id number]          PATIENT TYPE:  OIB   LOCATION:  2005                         FACILITY:  [facility]   PHYSICIAN:  Lezlie Octave, N.P.     DATE OF BIRTH:  1945-10-10   DATE OF ADMISSION:  06/03/2006  DATE OF DISCHARGE:  06/05/2006                               DISCHARGE SUMMARY   SUBJECTIVE:  Mr. Schildt is a 65 year old male patient of Dr. Yates Decamp,  who he had seen as an outpatient with complaints of chest pain.  He  apparently also had undergone a cardiac CT angiography on May 19, 2006  and he had a high-grade proximal LAD stenosis.  In the office Dr. Jacinto Halim  placed him on Coreg and aspirin and recommended cardiac catheterization  thus, he came in on June 03, 2006 for elective cardiac catheterization.  He was found to have a high-grade ramos lesion, 90%, he underwent  stenting with a 3.0 x 28 Cypher stent.  He had residual disease, 50% in  his LAD mid.  30-40% lesions in his RCA, and a 30% lesion in his  circumflex, his EF was 55%, his LIMA was widely patent.   POST PROCEDURE:  His enzymes are slightly positive for a small procedure  on non-ST elevation MI, thus he was kept in the hospital an extra day.  On June 05, 2006 he was seen by Dr. Jacinto Halim, considered stable for  discharge home.  He added ramipril 2.5 mg at bedtime.  He had been  started on Imdur on IV in the evening of June 04, 2006 secondary to  having another bout of chest pain.  It is known from his outpatient  medication list he takes Levitra p.r.n.  he was told not to take Levitra  or any other ED medications while he is on Imdur and if he has chest  pain after he takes them, he should not use any nitroglycerin  sublingual.  The patient understood.   LABS:  Hemoglobin 11.7 and hematocrit 35.2, WBCs 8.8, and platelets 288,  sodium 139, potassium 3.8, glucose was 96, his BUN was 7, creatinine  0.85.  CK-MB:  1. 160/13.9 with a  troponin of 0.78.  2. 209/19.7 with troponin of 3.54.  3. 160/11.9 with a troponin of 3.31.  4. 146/9.3 with a troponin of 2.84.  Hemoglobin A1c was 6.4.  No chest x-ray done at this admission at least  nothing in the computer.   DISCHARGE MEDICATIONS:  1. Aspirin 81 mg a day.  2. Plavix 75 mg a day.  3. He was told not to stop Lipitor 80 mg a day.  4. Carvedilol 6.25 mg twice a day.  5. Paxil 20 mg a day.  6. Imdur 30 mg a day.  7. Nitroglycerin 150 sublingual every 5 minutes p.r.n. for chest pain.  8. Ramipril 2.5 mg at bedtime every night.  9. He should not use Levitra or any other ED medication while he is on      Imdur and if he takes any sublingual nitroglycerin.  DISCHARGE INSTRUCTIONS:  1. He should do no strenuous activity for a week.  2. He should defer driving for 2 days.  3. He should see Dr. Aida Puffer back about his elevated hemoglobin      A1c at 6.4.  4. He will follow up with Dr. Jacinto Halim in 2 weeks.   DISCHARGE DIAGNOSES:  1. Unstable angina and positive cardiac CT angiography revealing a      high-grade proximal LAD stenosis.  2. Arthrosclerotic cardiovascular disease status post elective cardiac      catheterization showing high-grade ramos stenosis 90% with a      subsequent Cypher stenting with some residual disease in his left      anterior descending artery circumflex and right coronary artery      which are all non obstructive.  3. Normal ejection fraction 55%.  4. Non ST elevation myocardial infarction.  5. Elevated hemoglobin A1c at 6.4.  6. Hyperlipidemia.  7. Hypertension.  8. Daily alcohol.  The patient requests something for anxiety at the      time of discharge, he stated Dr. Jacinto Halim told him that he would give      him something however, the patient does not want to give up his      drinking behaviors, so it was decided between the patient and      myself not to give him any anxiety medication.      Lezlie Octave, N.P.     BB/MEDQ  D:   06/05/2006  T:  06/05/2006  Job:  [id number]

## 2011-11-16 ENCOUNTER — Ambulatory Visit (HOSPITAL_COMMUNITY)
Admission: RE | Admit: 2011-11-16 | Discharge: 2011-11-16 | Disposition: A | Discharge status: Home / Self Care | Payer: Medicare Other | Source: Ambulatory Visit | Attending: Family Medicine | Admitting: Family Medicine

## 2011-11-16 DIAGNOSIS — E7800 Pure hypercholesterolemia, unspecified: Secondary | ICD-10-CM | POA: Insufficient documentation

## 2011-11-16 DIAGNOSIS — I6529 Occlusion and stenosis of unspecified carotid artery: Secondary | ICD-10-CM | POA: Insufficient documentation

## 2011-11-16 DIAGNOSIS — R0989 Other specified symptoms and signs involving the circulatory and respiratory systems: Secondary | ICD-10-CM

## 2011-11-16 DIAGNOSIS — I779 Disorder of arteries and arterioles, unspecified: Secondary | ICD-10-CM

## 2011-11-16 NOTE — Progress Notes (Signed)
VASCULAR LAB PRELIMINARY  PRELIMINARY  PRELIMINARY  PRELIMINARY  Carotid duplex  completed.    Preliminary report:  Bilateral:  No evidence of hemodynamically significant internal carotid artery stenosis.   Vertebral artery flow is antegrade.      Jahlani Lorentz, RVT 11/16/2011, 9:41 AM

## 2012-09-18 ENCOUNTER — Other Ambulatory Visit: Payer: Self-pay | Admitting: *Deleted

## 2012-09-18 MED ORDER — FOLIC ACID 1 MG PO TABS: 1.0000 mg | ORAL_TABLET | Freq: Every day | ORAL | Status: DC

## 2012-10-19 ENCOUNTER — Other Ambulatory Visit: Payer: Self-pay | Admitting: Cardiovascular Disease

## 2012-10-19 NOTE — Telephone Encounter (Signed)
Rx was sent to pharmacy electronically. 

## 2012-12-04 ENCOUNTER — Encounter: Payer: Self-pay | Admitting: Internal Medicine

## 2013-01-08 ENCOUNTER — Ambulatory Visit: Payer: Medicare Other | Admitting: Internal Medicine

## 2013-01-12 ENCOUNTER — Ambulatory Visit: Payer: Medicare Other | Admitting: Nurse Practitioner

## 2013-01-16 ENCOUNTER — Other Ambulatory Visit (INDEPENDENT_AMBULATORY_CARE_PROVIDER_SITE_OTHER): Payer: Medicare Other

## 2013-01-16 ENCOUNTER — Encounter: Payer: Self-pay | Admitting: Internal Medicine

## 2013-01-16 ENCOUNTER — Ambulatory Visit (INDEPENDENT_AMBULATORY_CARE_PROVIDER_SITE_OTHER): Payer: Medicare Other | Admitting: Internal Medicine

## 2013-01-16 ENCOUNTER — Telehealth: Payer: Self-pay

## 2013-01-16 VITALS — BP 130/78 | HR 77 | Temp 98.3°F | Ht 71.0 in | Wt 164.0 lb

## 2013-01-16 DIAGNOSIS — I10 Essential (primary) hypertension: Secondary | ICD-10-CM

## 2013-01-16 DIAGNOSIS — R21 Rash and other nonspecific skin eruption: Secondary | ICD-10-CM

## 2013-01-16 DIAGNOSIS — E785 Hyperlipidemia, unspecified: Secondary | ICD-10-CM

## 2013-01-16 DIAGNOSIS — F329 Major depressive disorder, single episode, unspecified: Secondary | ICD-10-CM | POA: Insufficient documentation

## 2013-01-16 DIAGNOSIS — N320 Bladder-neck obstruction: Secondary | ICD-10-CM

## 2013-01-16 DIAGNOSIS — Z23 Encounter for immunization: Secondary | ICD-10-CM

## 2013-01-16 DIAGNOSIS — Z0001 Encounter for general adult medical examination with abnormal findings: Secondary | ICD-10-CM | POA: Insufficient documentation

## 2013-01-16 DIAGNOSIS — Z86010 Personal history of colonic polyps: Secondary | ICD-10-CM | POA: Insufficient documentation

## 2013-01-16 DIAGNOSIS — F319 Bipolar disorder, unspecified: Secondary | ICD-10-CM

## 2013-01-16 DIAGNOSIS — H9190 Unspecified hearing loss, unspecified ear: Secondary | ICD-10-CM

## 2013-01-16 DIAGNOSIS — G4700 Insomnia, unspecified: Secondary | ICD-10-CM | POA: Insufficient documentation

## 2013-01-16 DIAGNOSIS — I2510 Atherosclerotic heart disease of native coronary artery without angina pectoris: Secondary | ICD-10-CM | POA: Insufficient documentation

## 2013-01-16 DIAGNOSIS — K219 Gastro-esophageal reflux disease without esophagitis: Secondary | ICD-10-CM | POA: Insufficient documentation

## 2013-01-16 DIAGNOSIS — D509 Iron deficiency anemia, unspecified: Secondary | ICD-10-CM | POA: Insufficient documentation

## 2013-01-16 DIAGNOSIS — F32A Depression, unspecified: Secondary | ICD-10-CM | POA: Insufficient documentation

## 2013-01-16 DIAGNOSIS — F419 Anxiety disorder, unspecified: Secondary | ICD-10-CM | POA: Insufficient documentation

## 2013-01-16 DIAGNOSIS — G894 Chronic pain syndrome: Secondary | ICD-10-CM | POA: Insufficient documentation

## 2013-01-16 DIAGNOSIS — Z0000 Encounter for general adult medical examination without abnormal findings: Secondary | ICD-10-CM | POA: Insufficient documentation

## 2013-01-16 DIAGNOSIS — M1990 Unspecified osteoarthritis, unspecified site: Secondary | ICD-10-CM | POA: Insufficient documentation

## 2013-01-16 DIAGNOSIS — H9193 Unspecified hearing loss, bilateral: Secondary | ICD-10-CM

## 2013-01-16 DIAGNOSIS — Z955 Presence of coronary angioplasty implant and graft: Secondary | ICD-10-CM

## 2013-01-16 HISTORY — DX: Presence of coronary angioplasty implant and graft: Z95.5

## 2013-01-16 HISTORY — DX: Iron deficiency anemia, unspecified: D50.9

## 2013-01-16 LAB — URINALYSIS, ROUTINE W REFLEX MICROSCOPIC
Hgb urine dipstick: NEGATIVE
Leukocytes, UA: NEGATIVE
Nitrite: NEGATIVE
RBC / HPF: NONE SEEN (ref 0–?)
Specific Gravity, Urine: 1.025 (ref 1.000–1.030)
Urine Glucose: NEGATIVE
Urobilinogen, UA: 0.2 (ref 0.0–1.0)
WBC, UA: NONE SEEN (ref 0–?)

## 2013-01-16 LAB — CBC WITH DIFFERENTIAL/PLATELET
Basophils Absolute: 0 10*3/uL (ref 0.0–0.1)
Eosinophils Absolute: 0.1 10*3/uL (ref 0.0–0.7)
HCT: 34.8 % — ABNORMAL LOW (ref 39.0–52.0)
Hemoglobin: 12 g/dL — ABNORMAL LOW (ref 13.0–17.0)
Lymphocytes Relative: 14.9 % (ref 12.0–46.0)
Monocytes Relative: 7.6 % (ref 3.0–12.0)
Neutro Abs: 5.8 10*3/uL (ref 1.4–7.7)
Neutrophils Relative %: 75.4 % (ref 43.0–77.0)
RBC: 4.03 Mil/uL — ABNORMAL LOW (ref 4.22–5.81)
RDW: 13.6 % (ref 11.5–14.6)

## 2013-01-16 LAB — HEPATIC FUNCTION PANEL
ALT: 15 U/L (ref 0–53)
AST: 16 U/L (ref 0–37)
Albumin: 3.9 g/dL (ref 3.5–5.2)
Alkaline Phosphatase: 75 U/L (ref 39–117)
Bilirubin, Direct: 0 mg/dL (ref 0.0–0.3)
Total Protein: 7.8 g/dL (ref 6.0–8.3)

## 2013-01-16 LAB — BASIC METABOLIC PANEL
CO2: 29 mEq/L (ref 19–32)
Calcium: 9.4 mg/dL (ref 8.4–10.5)
Chloride: 98 mEq/L (ref 96–112)
Creatinine, Ser: 0.7 mg/dL (ref 0.4–1.5)
Glucose, Bld: 106 mg/dL — ABNORMAL HIGH (ref 70–99)
Potassium: 4.6 mEq/L (ref 3.5–5.1)

## 2013-01-16 LAB — LIPID PANEL
Cholesterol: 226 mg/dL — ABNORMAL HIGH (ref 0–200)
Total CHOL/HDL Ratio: 5
Triglycerides: 230 mg/dL — ABNORMAL HIGH (ref 0.0–149.0)
VLDL: 46 mg/dL — ABNORMAL HIGH (ref 0.0–40.0)

## 2013-01-16 LAB — LDL CHOLESTEROL, DIRECT: Direct LDL: 142.5 mg/dL

## 2013-01-16 MED ORDER — HYDROXYZINE HCL 25 MG PO TABS: 25.0000 mg | ORAL_TABLET | Freq: Four times a day (QID) | ORAL | Status: DC | PRN

## 2013-01-16 MED ORDER — SILDENAFIL CITRATE 20 MG PO TABS: 20.0000 mg | ORAL_TABLET | Freq: Two times a day (BID) | ORAL | Status: DC | PRN

## 2013-01-16 MED ORDER — PANTOPRAZOLE SODIUM 40 MG PO TBEC: 40.0000 mg | DELAYED_RELEASE_TABLET | Freq: Two times a day (BID) | ORAL | Status: DC

## 2013-01-16 MED ORDER — HYDROCODONE-ACETAMINOPHEN 5-325 MG PO TABS: 1.0000 | ORAL_TABLET | Freq: Four times a day (QID) | ORAL | Status: DC | PRN

## 2013-01-16 MED ORDER — LIDOCAINE 5 % EX PTCH: 1.0000 | MEDICATED_PATCH | CUTANEOUS | Status: DC

## 2013-01-16 MED ORDER — ALPRAZOLAM 1 MG PO TABS: ORAL_TABLET | ORAL | Status: DC

## 2013-01-16 MED ORDER — RAMIPRIL 2.5 MG PO CAPS: 2.5000 mg | ORAL_CAPSULE | Freq: Every day | ORAL | Status: DC

## 2013-01-16 MED ORDER — CARBAMAZEPINE 200 MG PO TABS: 200.0000 mg | ORAL_TABLET | Freq: Two times a day (BID) | ORAL | Status: DC

## 2013-01-16 MED ORDER — ROSUVASTATIN CALCIUM 5 MG PO TABS: 5.0000 mg | ORAL_TABLET | Freq: Every day | ORAL | Status: DC

## 2013-01-16 MED ORDER — CLOBETASOL PROPIONATE 0.05 % EX CREA: 1.0000 "application " | TOPICAL_CREAM | Freq: Two times a day (BID) | CUTANEOUS | Status: DC

## 2013-01-16 MED ORDER — ZOLPIDEM TARTRATE 10 MG PO TABS: 10.0000 mg | ORAL_TABLET | Freq: Every evening | ORAL | Status: DC | PRN

## 2013-01-16 MED ORDER — FENOFIBRATE 54 MG PO TABS: ORAL_TABLET | ORAL | Status: DC

## 2013-01-16 MED ORDER — CARVEDILOL 25 MG PO TABS: ORAL_TABLET | ORAL | Status: DC

## 2013-01-16 MED ORDER — FOLIC ACID 1 MG PO TABS: 1.0000 mg | ORAL_TABLET | Freq: Every day | ORAL | Status: DC

## 2013-01-16 MED ORDER — CLOPIDOGREL BISULFATE 75 MG PO TABS: 75.0000 mg | ORAL_TABLET | Freq: Once | ORAL | Status: DC

## 2013-01-16 NOTE — Patient Instructions (Addendum)
Please sign the release of information for [facility] - Dr Prince Rome, and [facility] You had the flu shot today Please return in 2 wks for a Nurse Visit for the new Prevnar pneumonia shot Please continue all other medications as before, and refills have been done if requested. Please have the pharmacy call with any other refills you may need. Please continue your efforts at being more active, low cholesterol diet, and weight control. You are otherwise up to date with prevention measures today. Please go to the LAB in the Basement (turn left off the elevator) for the tests to be done today You will be contacted by phone if any changes need to be made immediately.  Otherwise, you will receive a letter about your results with an explanation, but please check with MyChart first.  Please remember to sign up for My Chart if you have not done so, as this will be important to you in the future with finding out test results, communicating by private email, and scheduling acute appointments online when needed.  You are given the letter today explaining the transitional pain medication refill policy  Please be aware that I will no longer be able to offer monthly refills of any Schedule II or higher medication starting Mar 25, 2013  Please return in 6 months, or sooner if needed

## 2013-01-16 NOTE — Assessment & Plan Note (Signed)
Lab Results  Component Value Date   LDLCALC  Value: 92        Total Cholesterol/HDL:CHD Risk Coronary Heart Disease Risk Table                     Men   Women  1/2 Average Risk   3.4   3.3  Average Risk       5.0   4.4  2 X Average Risk   9.6   7.1  3 X Average Risk  23.4   11.0        Use the calculated Patient Ratio above and the CHD Risk Table to determine the patient's CHD Risk.        ATP III CLASSIFICATION (LDL):  <100     mg/dL   Optimal  098-119  mg/dL   Near or Above                    Optimal  130-159  mg/dL   Borderline  147-829  mg/dL   High  >562     mg/dL   Very High 03/24/8655  for f/u labs today, cont diet, goal ldl < 70

## 2013-01-16 NOTE — Assessment & Plan Note (Signed)
stable overall by history and exam, recent data reviewed with pt, and pt to continue medical treatment as before,  to f/u any worsening symptoms or concerns BP Readings from Last 3 Encounters:  01/16/13 130/78

## 2013-01-16 NOTE — Telephone Encounter (Signed)
Called left message to call back 

## 2013-01-16 NOTE — Progress Notes (Signed)
Pre-visit discussion using our clinic review tool. No additional management support is needed unless otherwise documented below in the visit note.  

## 2013-01-16 NOTE — Assessment & Plan Note (Addendum)
Ok for refills, to obtain prior records, also let pt know I will no longer be able to participate in chronic pain management with sched II or higher meds after feb 1 due to recent change in [place] law and more stringent [place] [employer] regulations  Note:  Total time for pt hx, exam, review of record with pt in the room, determination of diagnoses and plan for further eval and tx is > 60 min, with over 50% spent in coordination and counseling of patient

## 2013-01-16 NOTE — Telephone Encounter (Signed)
This was not actually addressed during his visit; please call to confirm reason for the sildenafil;  (The PA will not likely be approved if the reason is for ED)

## 2013-01-16 NOTE — Assessment & Plan Note (Signed)
Controlled now, etiology unclear, to refill clobetasol prn use

## 2013-01-16 NOTE — Assessment & Plan Note (Signed)
Also for psa as he is due 

## 2013-01-16 NOTE — Assessment & Plan Note (Signed)
Due to bilat wax impactions - improved with irrigation today

## 2013-01-16 NOTE — Progress Notes (Signed)
  Subjective:    Patient ID: Phillip [name], male    DOB: 06/27/1945, 67 y.o.   MRN: [id number]  HPI Here as new pt without prior records, pt with hx of bipolar illness, some difficult historian today; Pt denies chest pain, increased sob or doe, wheezing, orthopnea, PND, increased LE swelling, palpitations, dizziness or syncope.   Pt denies polydipsia, polyuria, .  Pt states overall good compliance with meds, trying to follow lower cholesterol diet, wt overall stable but little exercise however.   Pt denies new neurological symptoms such as new headache, or facial or extremity weakness or numbness.  For flu shot today.  Needs ramipril refill, does not always take the crestor daily. Due flu shot today. Has bilat hearing loss for > 1 wk today - ? wax Past Medical History  Diagnosis Date  . Arthritis   . Hypertension   . Hyperlipidemia   . Anxiety   . Depression   . CAD, multiple vessel   . History of colon polyps   . Insomnia   . Bipolar depression   . GERD (gastroesophageal reflux disease)   . Chronic pain disorder     chronic bilat shoulder pain and feet pain  . Stented coronary artery 01/16/2013    X 2  . Iron deficiency anemia 01/16/2013   Past Surgical History  Procedure Laterality Date  . Eye surgery    . Coronary angioplasty with stent placement  about 2014    current Cardiology: Dr Jomarie Longs    reports that he quit smoking about 10 years ago. His smokeless tobacco use includes Chew. He reports that he drinks alcohol. He reports that he does not use illicit drugs. family history includes Cancer in his mother; Heart disease in his maternal grandmother and mother; Hyperlipidemia in his maternal grandmother; Stroke in his maternal grandmother. Allergies  Allergen Reactions  . Lipitor [Atorvastatin] Other (See Comments)    myalgia   No current outpatient prescriptions on file prior to visit.   No current facility-administered medications on file prior to visit.    Review of  Systems  Constitutional: Negative for unexpected weight change, or unusual diaphoresis  HENT: Negative for tinnitus.   Eyes: Negative for photophobia and visual disturbance.  Respiratory: Negative for choking and stridor.   Gastrointestinal: Negative for vomiting and blood in stool.  Genitourinary: Negative for hematuria and decreased urine volume.  Musculoskeletal: Negative for acute joint swelling Skin: Negative for color change and wound.  Neurological: Negative for tremors and numbness other than noted  Psychiatric/Behavioral: Negative for decreased concentration or  hyperactivity.       Objective:   Physical Exam BP 130/78  Pulse 77  Temp(Src) 98.3 F (36.8 C) (Oral)  Ht 5\' 11"  (1.803 m)  Wt 164 lb (74.39 kg)  BMI 22.88 kg/m2  SpO2 94% VS noted,  Constitutional: Pt appears well-developed and well-nourished.  HENT: Head: NCAT.  Right Ear: External ear normal.  Left Ear: External ear normal.  Eyes: Conjunctivae and EOM are normal. Pupils are equal, round, and reactive to light.  Neck: Normal range of motion. Neck supple.  Hearing improved s/p irrigation bilat Cardiovascular: Normal rate and regular rhythm.   Pulmonary/Chest: Effort normal and breath sounds normal.  Abd:  Soft, NT, non-distended, + BS Neurological: Pt is alert. Not confused , motor 5/5, dtr/sens intact Skin: Skin is warm. No erythema.  Psychiatric: Pt behavior is normal. Thought content normal. but 2+ nervous    Assessment & Plan:

## 2013-01-16 NOTE — Telephone Encounter (Signed)
Received PA for Sildenafil citrate 20 mg advise please

## 2013-01-17 ENCOUNTER — Encounter: Payer: Self-pay | Admitting: Internal Medicine

## 2013-01-17 ENCOUNTER — Telehealth: Payer: Self-pay | Admitting: Internal Medicine

## 2013-01-17 DIAGNOSIS — N529 Male erectile dysfunction, unspecified: Secondary | ICD-10-CM | POA: Insufficient documentation

## 2013-01-17 DIAGNOSIS — E559 Vitamin D deficiency, unspecified: Secondary | ICD-10-CM

## 2013-01-17 HISTORY — DX: Male erectile dysfunction, unspecified: N52.9

## 2013-01-17 HISTORY — DX: Vitamin D deficiency, unspecified: E55.9

## 2013-01-17 NOTE — Telephone Encounter (Signed)
Called the patient left a detailed message of MD instructions. 

## 2013-01-17 NOTE — Telephone Encounter (Signed)
Rec'd from [facility] forward 46 pages to Dr.John

## 2013-01-17 NOTE — Telephone Encounter (Signed)
Called the patien

## 2013-01-17 NOTE — Telephone Encounter (Signed)
Pt would need to ask about price of medications and coverage by calling the cust service number of his insurance, as we are not responsible for this  I dont feel comfortable with increased crestor as this would be somewhat dishonest, and this is usually figured out by the pharmacy anyway

## 2013-01-17 NOTE — Telephone Encounter (Signed)
The patient does use the medication for ED and stated it is generic so wondered why the problem as he always does pay for it.  Also did not have crestor on his list as was to discuss with PCP amount to take.  He has 5mg , but would rather have 10mg  (take 1/2 to save money). ADVISE.  Also the patient has enough until the first of the year and does not need refill.

## 2013-03-07 ENCOUNTER — Ambulatory Visit: Payer: Medicare Other | Admitting: Family Medicine

## 2013-03-07 ENCOUNTER — Ambulatory Visit (INDEPENDENT_AMBULATORY_CARE_PROVIDER_SITE_OTHER): Payer: Medicare Other | Admitting: Internal Medicine

## 2013-03-07 ENCOUNTER — Encounter: Payer: Self-pay | Admitting: Internal Medicine

## 2013-03-07 VITALS — BP 128/80 | HR 66 | Temp 97.3°F | Ht 70.0 in | Wt 166.0 lb

## 2013-03-07 DIAGNOSIS — F411 Generalized anxiety disorder: Secondary | ICD-10-CM

## 2013-03-07 DIAGNOSIS — M79609 Pain in unspecified limb: Secondary | ICD-10-CM | POA: Diagnosis not present

## 2013-03-07 DIAGNOSIS — M25571 Pain in right ankle and joints of right foot: Secondary | ICD-10-CM

## 2013-03-07 DIAGNOSIS — M25532 Pain in left wrist: Secondary | ICD-10-CM | POA: Insufficient documentation

## 2013-03-07 DIAGNOSIS — M25539 Pain in unspecified wrist: Secondary | ICD-10-CM | POA: Diagnosis not present

## 2013-03-07 DIAGNOSIS — M25579 Pain in unspecified ankle and joints of unspecified foot: Secondary | ICD-10-CM | POA: Diagnosis not present

## 2013-03-07 DIAGNOSIS — F419 Anxiety disorder, unspecified: Secondary | ICD-10-CM

## 2013-03-07 DIAGNOSIS — M79642 Pain in left hand: Secondary | ICD-10-CM | POA: Insufficient documentation

## 2013-03-07 DIAGNOSIS — E785 Hyperlipidemia, unspecified: Secondary | ICD-10-CM | POA: Diagnosis not present

## 2013-03-07 DIAGNOSIS — M79641 Pain in right hand: Secondary | ICD-10-CM | POA: Insufficient documentation

## 2013-03-07 MED ORDER — LOVASTATIN 20 MG PO TABS: 20.0000 mg | ORAL_TABLET | Freq: Every day | ORAL | Status: DC

## 2013-03-07 MED ORDER — INDOMETHACIN 50 MG PO CAPS: 50.0000 mg | ORAL_CAPSULE | Freq: Three times a day (TID) | ORAL | Status: DC

## 2013-03-07 MED ORDER — PREDNISONE 10 MG PO TABS: ORAL_TABLET | ORAL | Status: DC

## 2013-03-07 MED ORDER — LORAZEPAM 1 MG PO TABS: 1.0000 mg | ORAL_TABLET | Freq: Two times a day (BID) | ORAL | Status: DC | PRN

## 2013-03-07 MED ORDER — METHYLPREDNISOLONE ACETATE 80 MG/ML IJ SUSP
80.0000 mg | Freq: Once | INTRAMUSCULAR | Status: AC
  Administered 2013-03-07: 80 mg via INTRAMUSCULAR

## 2013-03-07 NOTE — Progress Notes (Signed)
Pre-visit discussion using our clinic review tool. No additional management support is needed unless otherwise documented below in the visit note.  

## 2013-03-07 NOTE — Assessment & Plan Note (Signed)
Suspect acute gouty arthritis, ok for empiric depomedrol 80 IM, predpack asd , and indocin prn future attacks

## 2013-03-07 NOTE — Patient Instructions (Signed)
You had the steroid shot today Please take all new medication as prescribed - the prednisone as prescribed (a lower dose for just a short time) Please take all new medication as prescribed  - the indocin for future attacks (not now) Please take all new medication as prescribed  - the lovastatin (in place of the crestor), and lorazepam (in place of the xanax) Please continue all other medications as before, and refills have been done if requested. Please have the pharmacy call with any other refills you may need.  Please keep your appointments with your specialists as you have planned - cardiology tomorrow  You also have an appt with Dr Smith/sport med in our office at 1030AM today for the right ankle

## 2013-03-07 NOTE — Assessment & Plan Note (Signed)
Cannot afford crestor, off x 3 mo, to start lovastatin 20 qd

## 2013-03-07 NOTE — Assessment & Plan Note (Signed)
C/w acute gouty arthritis I suspect, same as for wrist - same tx

## 2013-03-07 NOTE — Assessment & Plan Note (Signed)
Ok change xanax to lorazapam prn,  to f/u any worsening symptoms or concerns

## 2013-03-07 NOTE — Assessment & Plan Note (Signed)
?   Etiology - for sport med referral now - ? Tendonitis vs other, may need orthotics

## 2013-03-07 NOTE — Progress Notes (Signed)
Subjective:    Patient ID: Phillip [name], male    DOB: [birth year], 68 y.o.   MRN: [id number]  HPI  Here to f/u, c/o 1 wk acute onset mod left hand and wrist pain without fever, trauma or hx of gout in past;  Also with 4 wks persistent constant mod medial right ankle pain, has known flat feet, no trauma but area of pain is puffy as well.  No longer able to afford the 5 mg crestor with his insurance, did not tolerate lipitro with myalgias in the past, and also xanax no longer covered as well.  Due to see card tomorrow.  Today, Pt denies chest pain, increased sob or doe, wheezing, orthopnea, PND, increased LE swelling, palpitations, dizziness or syncope.   Pt denies polydipsia, polyuria.   Past Medical History  Diagnosis Date  . Arthritis   . Hypertension   . Hyperlipidemia   . Anxiety   . Depression   . CAD, multiple vessel   . History of colon polyps   . Insomnia   . Bipolar depression   . GERD (gastroesophageal reflux disease)   . Chronic pain disorder     chronic bilat shoulder pain and feet pain  . Stented coronary artery 01/16/2013    X 2  . Iron deficiency anemia 01/16/2013  . Unspecified vitamin D deficiency 01/17/2013  . Erectile dysfunction 01/17/2013   Past Surgical History  Procedure Laterality Date  . Eye surgery    . Coronary angioplasty with stent placement  about 2014    current Cardiology: Dr Orene Desanctis    reports that he quit smoking about 11 years ago. His smokeless tobacco use includes Chew. He reports that he drinks alcohol. He reports that he does not use illicit drugs. family history includes Cancer in his mother; Heart disease in his maternal grandmother and mother; Hyperlipidemia in his maternal grandmother; Stroke in his maternal grandmother. Allergies  Allergen Reactions  . Lipitor [Atorvastatin] Other (See Comments)    myalgia   Current Outpatient Prescriptions on File Prior to Visit  Medication Sig Dispense Refill  . ALPRAZolam (XANAX) 1 MG tablet  Take 1/2 to 1 tablet twice daily as needed  60 tablet  5  . aspirin 81 MG tablet Take 81 mg by mouth daily.      . carbamazepine (TEGRETOL) 200 MG tablet Take 1 tablet (200 mg total) by mouth 2 (two) times daily.  180 tablet  3  . carvedilol (COREG) 25 MG tablet TAKE 1 TABLET BY MOUTH TWICE DAILY  180 tablet  3  . clobetasol cream (TEMOVATE) 7.82 % Apply 1 application topically 2 (two) times daily. As needed  60 g  1  . clopidogrel (PLAVIX) 75 MG tablet Take 1 tablet (75 mg total) by mouth once.  90 tablet  3  . fenofibrate 54 MG tablet TAKE 1 TABLET BY MOUTH DAILY  90 tablet  3  . folic acid (FOLVITE) 1 MG tablet Take 1 tablet (1 mg total) by mouth daily.  90 tablet  3  . HYDROcodone-acetaminophen (NORCO/VICODIN) 5-325 MG per tablet Take 1 tablet by mouth 4 (four) times daily as needed. To fill Mar 16, 2013  120 tablet  0  . hydrOXYzine (ATARAX/VISTARIL) 25 MG tablet Take 1 tablet (25 mg total) by mouth every 6 (six) hours as needed. Take 1 to 2 tablets Take by mouth every 6 (six) hours as needed.  60 tablet  1  . lidocaine (LIDODERM) 5 % Place 1 patch onto  the skin daily. Remove & Discard patch within 12 hours or as directed by MD  60 patch  5  . Omega-3 Fatty Acids (FISH OIL PO) Take by mouth daily.      . pantoprazole (PROTONIX) 40 MG tablet Take 1 tablet (40 mg total) by mouth 2 (two) times daily.  180 tablet  3  . ramipril (ALTACE) 2.5 MG capsule Take 1 capsule (2.5 mg total) by mouth daily. Take 2.5 mg by mouth daily.  90 capsule  3  . rosuvastatin (CRESTOR) 5 MG tablet Take 1 tablet (5 mg total) by mouth daily.  90 tablet  3  . sildenafil (REVATIO) 20 MG tablet Take 1 tablet (20 mg total) by mouth 2 (two) times daily as needed.  60 tablet  2  . zolpidem (AMBIEN) 10 MG tablet Take 1 tablet (10 mg total) by mouth at bedtime as needed for sleep.  30 tablet  5   No current facility-administered medications on file prior to visit.   Review of Systems  Constitutional: Negative for unexpected  weight change, or unusual diaphoresis  HENT: Negative for tinnitus.   Eyes: Negative for photophobia and visual disturbance.  Respiratory: Negative for choking and stridor.   Gastrointestinal: Negative for vomiting and blood in stool.  Genitourinary: Negative for hematuria and decreased urine volume.  Musculoskeletal: Negative for acute joint swelling Skin: Negative for color change and wound.  Neurological: Negative for tremors and numbness other than noted  Psychiatric/Behavioral: Negative for decreased concentration or  hyperactivity.       Objective:   Physical Exam BP 128/80  Pulse 66  Temp(Src) 97.3 F (36.3 C) (Oral)  Ht 5\' 10"  (1.778 m)  Wt 166 lb (75.297 kg)  BMI 23.82 kg/m2  SpO2 97% VS noted,  Constitutional: Pt appears well-developed and well-nourished.  HENT: Head: NCAT.  Right Ear: External ear normal.  Left Ear: External ear normal.  Eyes: Conjunctivae and EOM are normal. Pupils are equal, round, and reactive to light.  Neck: Normal range of motion. Neck supple.  Cardiovascular: Normal rate and regular rhythm.   Pulmonary/Chest: Effort normal and breath sounds normal.  Neurological: Pt is alert. Not confused  Skin: Skin is warm. No erythema.  Psychiatric: Pt behavior is normal. Thought content normal.  Left first and 2nd MCPs with tender swelling, mild warmth Left wrist with 1+ swelling, slight decreased ROM, mild tender Right medial ankle with area approx 1 cm swelling, tender just ant to the medial malleous, has marked pes planus    Assessment & Plan:

## 2013-03-08 ENCOUNTER — Ambulatory Visit (INDEPENDENT_AMBULATORY_CARE_PROVIDER_SITE_OTHER): Payer: Medicare Other | Admitting: Cardiovascular Disease

## 2013-03-08 ENCOUNTER — Encounter: Payer: Self-pay | Admitting: Cardiovascular Disease

## 2013-03-08 VITALS — BP 140/70 | HR 82 | Resp 20 | Ht 70.5 in | Wt 167.9 lb

## 2013-03-08 DIAGNOSIS — I10 Essential (primary) hypertension: Secondary | ICD-10-CM

## 2013-03-08 DIAGNOSIS — Z79899 Other long term (current) drug therapy: Secondary | ICD-10-CM

## 2013-03-08 DIAGNOSIS — N529 Male erectile dysfunction, unspecified: Secondary | ICD-10-CM

## 2013-03-08 DIAGNOSIS — E782 Mixed hyperlipidemia: Secondary | ICD-10-CM

## 2013-03-08 DIAGNOSIS — I2510 Atherosclerotic heart disease of native coronary artery without angina pectoris: Secondary | ICD-10-CM

## 2013-03-08 HISTORY — DX: Mixed hyperlipidemia: E78.2

## 2013-03-08 MED ORDER — PRAVASTATIN SODIUM 80 MG PO TABS: 80.0000 mg | ORAL_TABLET | Freq: Every day | ORAL | Status: DC

## 2013-03-08 NOTE — Assessment & Plan Note (Signed)
He has both elevated triglycerides and total cholesterol and LDL cholesterol. Unfortunately he could not afford Crestor and had severe myalgias with Lipitor. Since he did well with Crestor it may be that he tolerates hydrosoluble statins better than the fat soluble ones. He has not yet filled his prescription for lovastatin so I will substitute with a prescription for pravastatin. Recheck lipids in 3 months. Unfortunately the pravastatin is much weaker, if he does not achieve his target LDL (<70) and non-HDL (<130) levels, will have to switch back to Crestor and find some financial solution for this medication.

## 2013-03-08 NOTE — Assessment & Plan Note (Signed)
Fair control.

## 2013-03-08 NOTE — Assessment & Plan Note (Signed)
Reminded to carefully avoid any simultaneous use of nitrates and sildenafil due to the risk of potentially fatal hypotension.

## 2013-03-08 NOTE — Progress Notes (Signed)
Patient ID: Phillip [name], male   DOB: 25-Mar-1945, 68 y.o.   MRN: [id number]     Reason for office visit CAD, hyperlipidemia  Phillip [name] has not had any complaints of angina or other cardiac symptoms. He received drug-eluting stents in 2008 and 2010 respectively and had a normal perfusion study in 2012. He has had problems with his lipid-lowering medications. He achieved good results on atorvastatin 80 in combination with fenofibrate, but had to stop the Lipitor due to severe myalgia. He did well on low-dose Crestor but could not afford this medication and stopped it roughly 3 months ago. While on fenofibrate monotherapy his LDL cholesterol has risen to greater than 140. Phillip [name] gave him a prescription for lovastatin at his recent appointment but he has not yet filled this prescription.   Allergies  Allergen Reactions  . Lipitor [Atorvastatin] Other (See Comments)    myalgia    Current Outpatient Prescriptions  Medication Sig Dispense Refill  . aspirin 81 MG tablet Take 81 mg by mouth daily.      . carbamazepine (TEGRETOL) 200 MG tablet Take 1 tablet (200 mg total) by mouth 2 (two) times daily.  180 tablet  3  . carvedilol (COREG) 25 MG tablet TAKE 1 TABLET BY MOUTH TWICE DAILY  180 tablet  3  . clobetasol cream (TEMOVATE) 4.48 % Apply 1 application topically 2 (two) times daily. As needed  60 g  1  . clopidogrel (PLAVIX) 75 MG tablet Take 1 tablet (75 mg total) by mouth once.  90 tablet  3  . fenofibrate 54 MG tablet TAKE 1 TABLET BY MOUTH DAILY  90 tablet  3  . folic acid (FOLVITE) 1 MG tablet Take 1 tablet (1 mg total) by mouth daily.  90 tablet  3  . HYDROcodone-acetaminophen (NORCO/VICODIN) 5-325 MG per tablet Take 1 tablet by mouth 4 (four) times daily as needed. To fill Mar 16, 2013  120 tablet  0  . hydrOXYzine (ATARAX/VISTARIL) 25 MG tablet Take 1 tablet (25 mg total) by mouth every 6 (six) hours as needed. Take 1 to 2 tablets Take by mouth every 6 (six) hours as needed.  60  tablet  1  . indomethacin (INDOCIN) 50 MG capsule Take 1 capsule (50 mg total) by mouth 3 (three) times daily with meals. Only for gouty attacks  90 capsule  3  . lidocaine (LIDODERM) 5 % Place 1 patch onto the skin daily. Remove & Discard patch within 12 hours or as directed by MD  60 patch  5  . LORazepam (ATIVAN) 1 MG tablet Take 1 tablet (1 mg total) by mouth 2 (two) times daily as needed for anxiety.  60 tablet  2  . Omega-3 Fatty Acids (FISH OIL PO) Take by mouth daily.      . pantoprazole (PROTONIX) 40 MG tablet Take 1 tablet (40 mg total) by mouth 2 (two) times daily.  180 tablet  3  . predniSONE (DELTASONE) 10 MG tablet 3 tabs by mouth per day for 3 days,2tabs per day for 3 days,1tab per day for 3 days  18 tablet  0  . ramipril (ALTACE) 2.5 MG capsule Take 1 capsule (2.5 mg total) by mouth daily. Take 2.5 mg by mouth daily.  90 capsule  3  . sildenafil (REVATIO) 20 MG tablet Take 1 tablet (20 mg total) by mouth 2 (two) times daily as needed.  60 tablet  2  . zolpidem (AMBIEN) 10 MG tablet Take 1 tablet (10  mg total) by mouth at bedtime as needed for sleep.  30 tablet  5  . pravastatin (PRAVACHOL) 80 MG tablet Take 1 tablet (80 mg total) by mouth daily.  90 tablet  3   No current facility-administered medications for this visit.    Past Medical History  Diagnosis Date  . Arthritis   . Hypertension   . Hyperlipidemia   . Anxiety   . Depression   . CAD, multiple vessel   . History of colon polyps   . Insomnia   . Bipolar depression   . GERD (gastroesophageal reflux disease)   . Chronic pain disorder     chronic bilat shoulder pain and feet pain  . Stented coronary artery 01/16/2013    X 2  . Iron deficiency anemia 01/16/2013  . Unspecified vitamin D deficiency 01/17/2013  . Erectile dysfunction 01/17/2013  . Mixed hyperlipidemia 03/08/2013    Past Surgical History  Procedure Laterality Date  . Eye surgery    . Coronary angioplasty with stent placement  about 2014     current Cardiology: Phillip [name]    Family History  Problem Relation Age of Onset  . Cancer Mother   . Heart disease Mother   . Heart disease Maternal Grandmother   . Stroke Maternal Grandmother   . Hyperlipidemia Maternal Grandmother     History   Social History  . Marital Status: Single    Spouse Name: N/A    Number of Children: N/A  . Years of Education: N/A   Occupational History  . Not on file.   Social History Main Topics  . Smoking status: Former Smoker -- 1.00 packs/day for 20 years    Types: Cigarettes    Quit date: 02/22/2002  . Smokeless tobacco: Current User    Types: Chew  . Alcohol Use: Yes  . Drug Use: No  . Sexual Activity: Not on file   Other Topics Concern  . Not on file   Social History Narrative  . No narrative on file    Review of systems: The patient specifically denies any chest pain at rest or with exertion, dyspnea at rest or with exertion, orthopnea, paroxysmal nocturnal dyspnea, syncope, palpitations, focal neurological deficits, intermittent claudication, lower extremity edema, unexplained weight gain, cough, hemoptysis or wheezing.  The patient also denies abdominal pain, nausea, vomiting, dysphagia, diarrhea, constipation, polyuria, polydipsia, dysuria, hematuria, frequency, urgency, abnormal bleeding or bruising, fever, chills, unexpected weight changes, mood swings, change in skin or hair texture, change in voice quality, auditory or visual problems, allergic reactions or rashes, new musculoskeletal complaints other than usual "aches and pains".   PHYSICAL EXAM BP 140/70  Pulse 82  Resp 20  Ht 5' 10.5" (1.791 m)  Wt 76.159 kg (167 lb 14.4 oz)  BMI 23.74 kg/m2  General: Alert, oriented x3, no distress Head: no evidence of trauma, PERRL, EOMI, no exophtalmos or lid lag, no myxedema, no xanthelasma; normal ears, nose and oropharynx Neck: normal jugular venous pulsations and no hepatojugular reflux; brisk carotid pulses without delay  and no carotid bruits Chest: clear to auscultation, no signs of consolidation by percussion or palpation, normal fremitus, symmetrical and full respiratory excursions, sternotomy scar Cardiovascular: normal position and quality of the apical impulse, regular rhythm, normal first and second heart sounds, no murmurs, rubs or gallops Abdomen: no tenderness or distention, no masses by palpation, no abnormal pulsatility or arterial bruits, normal bowel sounds, no hepatosplenomegaly Extremities: no clubbing, cyanosis or edema; 2+ radial, ulnar and brachial pulses  bilaterally; 2+ right femoral, posterior tibial and dorsalis pedis pulses; 2+ left femoral, posterior tibial and dorsalis pedis pulses; no subclavian or femoral bruits Neurological: grossly nonfocal   EKG: Sinus rhythm, normal tracing  Lipid Panel     Component Value Date/Time   CHOL 226* 01/16/2013 1150   TRIG 230.0* 01/16/2013 1150   HDL 45.80 01/16/2013 1150   CHOLHDL 5 01/16/2013 1150   VLDL 46.0* 01/16/2013 1150   LDLCALC  Value: 92        Total Cholesterol/HDL:CHD Risk Coronary Heart Disease Risk Table                     Men   Women  1/2 Average Risk   3.4   3.3  Average Risk       5.0   4.4  2 X Average Risk   9.6   7.1  3 X Average Risk  23.4   11.0        Use the calculated Patient Ratio above and the CHD Risk Table to determine the patient's CHD Risk.        ATP III CLASSIFICATION (LDL):  <100     mg/dL   Optimal  100-129  mg/dL   Near or Above                    Optimal  130-159  mg/dL   Borderline  160-189  mg/dL   High  >190     mg/dL   Very High 06/18/2008 1930    BMET    Component Value Date/Time   NA 133* 01/16/2013 1150   K 4.6 01/16/2013 1150   CL 98 01/16/2013 1150   CO2 29 01/16/2013 1150   GLUCOSE 106* 01/16/2013 1150   BUN 13 01/16/2013 1150   CREATININE 0.7 01/16/2013 1150   CALCIUM 9.4 01/16/2013 1150   GFRNONAA >60 09/17/2008 0425   GFRAA  Value: >60        The eGFR has been calculated using the MDRD  equation. This calculation has not been validated in all clinical situations. eGFR's persistently <60 mL/min signify possible Chronic Kidney Disease. 09/17/2008 0425     ASSESSMENT AND PLAN CAD, multiple vessel No symptoms of active coronary sufficiency. He has previously received stents to the ramus intermedius artery and the first oblique marginal artery with good long-term results. The focus remains on risk factor modification.  Mixed hyperlipidemia He has both elevated triglycerides and total cholesterol and LDL cholesterol. Unfortunately he could not afford Crestor and had severe myalgias with Lipitor. Since he did well with Crestor it may be that he tolerates hydrosoluble statins better than the fat soluble ones. He has not yet filled his prescription for lovastatin so I will substitute with a prescription for pravastatin. Recheck lipids in 3 months. Unfortunately the pravastatin is much weaker, if he does not achieve his target LDL (<70) and non-HDL (<130) levels, will have to switch back to Crestor and find some financial solution for this medication.  Hypertension Fair control  Erectile dysfunction Reminded to carefully avoid any simultaneous use of nitrates and sildenafil due to the risk of potentially fatal hypotension.  Orders Placed This Encounter  Procedures  . Hepatic function panel  . Lipid Profile  . EKG 12-Lead   Meds ordered this encounter  Medications  . pravastatin (PRAVACHOL) 80 MG tablet    Sig: Take 1 tablet (80 mg total) by mouth daily.    Dispense:  90 tablet  Refill:  Cobden Liset Mcmonigle, MD, [facility] [phone] office [phone] pager

## 2013-03-08 NOTE — Patient Instructions (Signed)
Your physician recommends that you schedule a follow-up appointment in:6 months with Dr Timmie Foerster  FASTING BLOOD WORK IN 3 MONTHS  STOP TAKING MEVACOR  START TAKING PRAVASTATIN 80 MG DAILY

## 2013-03-08 NOTE — Assessment & Plan Note (Signed)
No symptoms of active coronary sufficiency. He has previously received stents to the ramus intermedius artery and the first oblique marginal artery with good long-term results. The focus remains on risk factor modification.

## 2013-03-09 ENCOUNTER — Telehealth: Payer: Self-pay | Admitting: *Deleted

## 2013-03-09 NOTE — Telephone Encounter (Signed)
Patient phoned saying his drug store hadn't received ativan refill request from office.  According to [facility], it was sent on 1/14.  Notified patient

## 2013-05-15 ENCOUNTER — Ambulatory Visit (INDEPENDENT_AMBULATORY_CARE_PROVIDER_SITE_OTHER): Payer: Medicare Other | Admitting: Internal Medicine

## 2013-05-15 ENCOUNTER — Encounter: Payer: Self-pay | Admitting: Internal Medicine

## 2013-05-15 ENCOUNTER — Other Ambulatory Visit (INDEPENDENT_AMBULATORY_CARE_PROVIDER_SITE_OTHER): Payer: Medicare Other

## 2013-05-15 VITALS — BP 172/88 | HR 77 | Temp 98.1°F | Ht 70.0 in | Wt 165.0 lb

## 2013-05-15 DIAGNOSIS — M791 Myalgia, unspecified site: Secondary | ICD-10-CM | POA: Insufficient documentation

## 2013-05-15 DIAGNOSIS — F419 Anxiety disorder, unspecified: Secondary | ICD-10-CM

## 2013-05-15 DIAGNOSIS — E782 Mixed hyperlipidemia: Secondary | ICD-10-CM

## 2013-05-15 DIAGNOSIS — IMO0001 Reserved for inherently not codable concepts without codable children: Secondary | ICD-10-CM

## 2013-05-15 DIAGNOSIS — M2550 Pain in unspecified joint: Secondary | ICD-10-CM

## 2013-05-15 DIAGNOSIS — F411 Generalized anxiety disorder: Secondary | ICD-10-CM

## 2013-05-15 DIAGNOSIS — I10 Essential (primary) hypertension: Secondary | ICD-10-CM

## 2013-05-15 LAB — HEPATIC FUNCTION PANEL
ALBUMIN: 4.1 g/dL (ref 3.5–5.2)
ALT: 14 U/L (ref 0–53)
AST: 15 U/L (ref 0–37)
Alkaline Phosphatase: 73 U/L (ref 39–117)
Bilirubin, Direct: 0 mg/dL (ref 0.0–0.3)
Total Bilirubin: 0.4 mg/dL (ref 0.3–1.2)
Total Protein: 8 g/dL (ref 6.0–8.3)

## 2013-05-15 LAB — LIPID PANEL
Cholesterol: 189 mg/dL (ref 0–200)
HDL: 40.8 mg/dL (ref >39.00)
LDL Cholesterol: 128 mg/dL — ABNORMAL HIGH (ref 0–99)
TRIGLYCERIDES: 99 mg/dL (ref 0.0–149.0)
Total CHOL/HDL Ratio: 5
VLDL: 19.8 mg/dL (ref 0.0–40.0)

## 2013-05-15 LAB — SEDIMENTATION RATE: Sed Rate: 57 mm/hr — ABNORMAL HIGH (ref 0–22)

## 2013-05-15 LAB — CK: Total CK: 50 U/L (ref 7–232)

## 2013-05-15 NOTE — Assessment & Plan Note (Signed)
Ok to cont xanax prn,  to f/u any worsening symptoms or concerns  

## 2013-05-15 NOTE — Progress Notes (Signed)
Subjective:    Patient ID: Phillip [name], male    DOB: [birth year], 68 y.o.   MRN: [id number]  HPI   Here to f/u; overall doing ok,  Pt denies chest pain, increased sob or doe, wheezing, orthopnea, PND, increased LE swelling, palpitations, dizziness or syncope.  Pt denies polydipsia, polyuria, or low sugar symptoms such as weakness or confusion improved with po intake.  Pt denies new neurological symptoms such as new headache, or facial or extremity weakness or numbness.   Pt states overall good compliance with meds, has been trying to follow lower cholesterol diet.  Lorazepam did not work, so back to alprazolam.  Has some vicodin prn saved at home, uses only rarely, getting by mostly on alleve for chronic pain for knees and shoulders.  Not using tylenol.  Described mostly joint pain to me, but also with occas myalgias as well, mild compared to joints. Not seeing ortho, but willing to see Dr Tamala Julian. Denies worsening depressive symptoms, suicidal ideation, or panic; has ongoing anxiety.   Has painful ganglion cyst to left first dorsal compartment, tender quite a bit today. States BP at home ok, decliens med change today Past Medical History  Diagnosis Date  . Arthritis   . Hypertension   . Hyperlipidemia   . Anxiety   . Depression   . CAD, multiple vessel   . History of colon polyps   . Insomnia   . Bipolar depression   . GERD (gastroesophageal reflux disease)   . Chronic pain disorder     chronic bilat shoulder pain and feet pain  . Stented coronary artery 01/16/2013    X 2  . Iron deficiency anemia 01/16/2013  . Unspecified vitamin D deficiency 01/17/2013  . Erectile dysfunction 01/17/2013  . Mixed hyperlipidemia 03/08/2013   Past Surgical History  Procedure Laterality Date  . Eye surgery    . Coronary angioplasty with stent placement  about 2014    current Cardiology: Dr Orene Desanctis    reports that he quit smoking about 11 years ago. His smoking use included Cigarettes. He has a 20  pack-year smoking history. His smokeless tobacco use includes Chew. He reports that he drinks alcohol. He reports that he does not use illicit drugs. family history includes Cancer in his mother; Heart disease in his maternal grandmother and mother; Hyperlipidemia in his maternal grandmother; Stroke in his maternal grandmother. Allergies  Allergen Reactions  . Lipitor [Atorvastatin] Other (See Comments)    myalgia   Current Outpatient Prescriptions on File Prior to Visit  Medication Sig Dispense Refill  . aspirin 81 MG tablet Take 81 mg by mouth daily.      . carbamazepine (TEGRETOL) 200 MG tablet Take 1 tablet (200 mg total) by mouth 2 (two) times daily.  180 tablet  3  . carvedilol (COREG) 25 MG tablet TAKE 1 TABLET BY MOUTH TWICE DAILY  180 tablet  3  . clobetasol cream (TEMOVATE) 5.64 % Apply 1 application topically 2 (two) times daily. As needed  60 g  1  . clopidogrel (PLAVIX) 75 MG tablet Take 1 tablet (75 mg total) by mouth once.  90 tablet  3  . fenofibrate 54 MG tablet TAKE 1 TABLET BY MOUTH DAILY  90 tablet  3  . folic acid (FOLVITE) 1 MG tablet Take 1 tablet (1 mg total) by mouth daily.  90 tablet  3  . HYDROcodone-acetaminophen (NORCO/VICODIN) 5-325 MG per tablet Take 1 tablet by mouth 4 (four) times daily as needed.  To fill Mar 16, 2013  120 tablet  0  . Omega-3 Fatty Acids (FISH OIL PO) Take by mouth daily.      . pantoprazole (PROTONIX) 40 MG tablet Take 1 tablet (40 mg total) by mouth 2 (two) times daily.  180 tablet  3  . pravastatin (PRAVACHOL) 80 MG tablet Take 1 tablet (80 mg total) by mouth daily.  90 tablet  3  . ramipril (ALTACE) 2.5 MG capsule Take 1 capsule (2.5 mg total) by mouth daily. Take 2.5 mg by mouth daily.  90 capsule  3  . sildenafil (REVATIO) 20 MG tablet Take 1 tablet (20 mg total) by mouth 2 (two) times daily as needed.  60 tablet  2  . zolpidem (AMBIEN) 10 MG tablet Take 1 tablet (10 mg total) by mouth at bedtime as needed for sleep.  30 tablet  5   No  current facility-administered medications on file prior to visit.   Review of Systems  Constitutional: Negative for unexpected weight change, or unusual diaphoresis  HENT: Negative for tinnitus.   Eyes: Negative for photophobia and visual disturbance.  Respiratory: Negative for choking and stridor.   Gastrointestinal: Negative for vomiting and blood in stool.  Genitourinary: Negative for hematuria and decreased urine volume.  Musculoskeletal: Negative for acute joint swelling Skin: Negative for color change and wound.  Neurological: Negative for tremors and numbness other than noted  Psychiatric/Behavioral: Negative for decreased concentration or  hyperactivity.       Objective:   Physical Exam BP 172/88  Pulse 77  Temp(Src) 98.1 F (36.7 C) (Oral)  Ht 5\' 10"  (1.778 m)  Wt 165 lb (74.844 kg)  BMI 23.68 kg/m2  SpO2 97% BP Readings from Last 3 Encounters:  05/15/13 172/88  03/08/13 140/70  03/07/13 128/80  VS noted,  Constitutional: Pt appears well-developed and well-nourished.  HENT: Head: NCAT.  Right Ear: External ear normal.  Left Ear: External ear normal.  Eyes: Conjunctivae and EOM are normal. Pupils are equal, round, and reactive to light.  Neck: Normal range of motion. Neck supple.  Cardiovascular: Normal rate and regular rhythm.   Pulmonary/Chest: Effort normal and breath sounds normal.  Abd:  Soft, NT, non-distended, + BS Neurological: Pt is alert. Not confused  Skin: Skin is warm. No erythema.  Psychiatric: Pt behavior is normal. Thought content normal.1+ nervous  No effusions, but has "puffiness" of MCps bilat hands    Assessment & Plan:

## 2013-05-15 NOTE — Progress Notes (Signed)
Pre visit review using our clinic review tool, if applicable. No additional management support is needed unless otherwise documented below in the visit note. 

## 2013-05-15 NOTE — Assessment & Plan Note (Signed)
Unclear etiology, for rheum labs, r/o ra, lupus, PMR, also for sport med referral for mult joint issues including shoulder, right hip, and bilat knees

## 2013-05-15 NOTE — Assessment & Plan Note (Addendum)
Also for sed rate, CPK, r/lo myositis, ok to cont statin for now  Note:  Total time for pt hx, exam, review of record with pt in the room, determination of diagnoses and plan for further eval and tx is > 40 min, with over 50% spent in coordination and counseling of patient

## 2013-05-15 NOTE — Assessment & Plan Note (Signed)
elev today, likely reactive with pain, to cont to monitor at home BP Readings from Last 3 Encounters:  05/15/13 172/88  03/08/13 140/70  03/07/13 128/80

## 2013-05-15 NOTE — Patient Instructions (Addendum)
Please try tylenol 8 hr - 1 every 8 hrs to help with pain  Please continue all other medications as before, and refills have been done if requested. Please have the pharmacy call with any other refills you may need.  Please go to the LAB in the Basement (turn left off the elevator) for the tests to be done today You will be contacted by phone if any changes need to be made immediately.  Otherwise, you will receive a letter about your results with an explanation, but please check with MyChart first.  Please keep your appointments with your specialists as you may have planned  You will be contacted regarding the referral for: Dr Tamala Julian - sport med

## 2013-05-15 NOTE — Assessment & Plan Note (Signed)
For f/u lipids, lfts today

## 2013-05-16 LAB — ANA: ANA: NEGATIVE

## 2013-05-16 LAB — RHEUMATOID FACTOR: Rhuematoid fact SerPl-aCnc: 178 IU/mL — ABNORMAL HIGH (ref <=14)

## 2013-05-17 ENCOUNTER — Other Ambulatory Visit: Payer: Self-pay | Admitting: Internal Medicine

## 2013-05-17 DIAGNOSIS — M2550 Pain in unspecified joint: Secondary | ICD-10-CM

## 2013-05-17 DIAGNOSIS — R768 Other specified abnormal immunological findings in serum: Secondary | ICD-10-CM

## 2013-05-17 DIAGNOSIS — R7689 Other specified abnormal immunological findings in serum: Secondary | ICD-10-CM

## 2013-05-18 ENCOUNTER — Telehealth: Payer: Self-pay

## 2013-05-18 NOTE — Telephone Encounter (Signed)
Patient informed of lab results. 

## 2013-05-18 NOTE — Telephone Encounter (Signed)
Patient states he is returning your call regarding results.  Cb# [phone]

## 2013-05-23 ENCOUNTER — Encounter: Payer: Self-pay | Admitting: Family Medicine

## 2013-05-23 ENCOUNTER — Ambulatory Visit (INDEPENDENT_AMBULATORY_CARE_PROVIDER_SITE_OTHER): Payer: Medicare Other | Admitting: Family Medicine

## 2013-05-23 VITALS — BP 136/68 | HR 78

## 2013-05-23 DIAGNOSIS — M069 Rheumatoid arthritis, unspecified: Secondary | ICD-10-CM

## 2013-05-23 NOTE — Progress Notes (Signed)
  [facility] [address], [place] [address] Phone: [phone] Subjective:    I'm seeing this patient by the request  of:  Cathlean Cower, MD   CC: polyarthralgia.   QVZ:DGLOVFIEPP BENECIO KLUGER is a 68 y.o. male coming in with complaint of polyarthralgia.  Patient has had this pain for quite some time but since his birthday in one month ago he states that the pain has gotten severely worse. Patient had been controlled with his pain medications which primary care provider is likely to give him anymore which she blames on the affordable Care act. Patient's did have a IM injection of corticosteroids previously that did give him benefit for approximately 5-6 days but now he states that he is even worse at night. Patient states multiple joints including his hands and knees being the worst seem to hurt on a daily basis and he feels like he is losing some range of motion. Patient denies any loss of strength but states that the pain seems to be more of a constant dull aching sensation that this did not improve with over-the-counter medications and only responds to his Norco. Patient is extremely disgruntled and is unwilling to get any more information because I should know the information already. Patient states that he does not trust me.     Past medical history, social, surgical and family history all reviewed in electronic medical record.   Review of Systems: No headache, visual changes, nausea, vomiting, diarrhea, constipation, dizziness, abdominal pain, skin rash, fevers, chills, night sweats, weight loss, swollen lymph nodes, body aches, joint swelling, muscle aches, chest pain, shortness of breath, mood changes.   Objective Blood pressure 136/68, pulse 78, SpO2 97.00%.  General: No apparent distress alert and oriented x3 mood and affect normal, dressed appropriately. Patient is extremely disgruntled.  HEENT: Pupils equal, extraocular movements intact    Respiratory: Patient's speak in full sentences and does not appear short of breath  Cardiovascular: No lower extremity edema, non tender, no erythema  Skin: Warm dry intact with no signs of infection or rash on extremities or on axial skeleton.  Abdomen: Soft nontender  Neuro: Cranial nerves II through XII are intact, neurovascularly intact in all extremities with 2+ DTRs and 2+ pulses.  Lymph: No lymphadenopathy of posterior or anterior cervical chain or axillae bilaterally.  Gait normal with good balance and coordination.  MSK:  Non tender with full range of motion and good stability and symmetric strength and tone of shoulders, elbows, wrist, hip, knee and ankles bilaterally. 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 Patient also has osteoarthritic changes of the knees bilaterally and is tender generalized overall joints. Patient does have good range of motion. Mild synovitis noted in multiple joints. Neurovascularly intact distally. Patient was not extremely helpful during the exam he did not allow for further exam to occur.    Impression and Recommendations:     This case required medical decision making of moderate complexity.

## 2013-05-23 NOTE — Patient Instructions (Signed)
Very nice to meet you Heat before activity and ice after activity.  Take tylenol 650 mg three times a day is the best evidence based medicine we have for arthritis.  Glucosamine sulfate 750mg  twice a day is a supplement that has been shown to help moderate to severe arthritis. Vitamin D 2000 IU daily Fish oil 2 grams daily.  Tumeric 500mg  twice daily.  Capsaicin topically up to four times a day may also help with pain. It's important that you continue to stay active. Controlling your weight is important.  Consider physical therapy to strengthen muscles around the joint that hurts to take pressure off of the joint itself. Shoe inserts with good arch support may be helpful.  [employer] orthotics at [employer] could help.  Water aerobics and cycling with low resistance are the best two types of exercise for arthritis. I think rheumatology will be more helpful and will help with the arthritis.  See me if you need me.  I can do injections if any joints get severe.     Rheumatoid Arthritis Rheumatoid arthritis is a long-term (chronic) inflammatory disease that causes pain, swelling, and stiffness of the joints. It can affect the entire body, including the eyes and lungs. The effects of rheumatoid arthritis vary widely among those with the condition. CAUSES  The cause of rheumatoid arthritis is not known. It tends to run in families and is more common in women. Certain cells of the body's natural defense system (immune system) do not work properly and begin to attack healthy joints. It primarily involves the connective tissue that lines the joints (synovial membrane). This can cause damage to the joint. SYMPTOMS   Pain, stiffness, swelling, and decreased motion of many joints, especially in the hands and feet.  Stiffness that is worse in the morning. It may last 1 2 hours or longer.  Numbness and tingling in the hands.  Fatigue.  Loss of appetite.  Weight loss.  Low-grade fever.  Dry  eyes and mouth.  Firm lumps (rheumatoid nodules) that grow beneath the skin in areas such as the elbows and hands. DIAGNOSIS  Diagnosis is based on the symptoms described, an exam, and blood tests. Sometimes, X-rays are helpful. TREATMENT  The goals of treatment are to relieve pain, reduce inflammation, and to slow down or stop joint damage and disability. Methods vary and may include:  Maintaining a balance of rest, exercise, and proper nutrition.  Medicines:  Pain relievers (analgesics).  Corticosteroids and nonsteroidal anti-inflammatory drugs (NSAIDs) to reduce inflammation.  Disease-modifying antirheumatic drugs (DMARDs) to try to slow the course of the disease.  Biologic response modifiers to reduce inflammation and damage.  Physical therapy and occupational therapy.  Surgery for patients with severe joint damage. Joint replacement or fusing of joints may be needed.  Routine monitoring and ongoing care, such as office visits, blood and urine tests, and X-rays. HOME CARE INSTRUCTIONS   Remain physically active and reduce activity when the disease gets worse.  Eat a well-balanced diet.  Put heat on affected joints when you wake up and before activities. Keep the heat on the affected joint for as long as directed by your caregiver.  Put ice on affected joints following activities or exercising.  Put ice in a plastic bag.  Place a towel between your skin and the bag.  Leave the ice on for 15-20 minutes, 03-04 times a day.  Take all medicines and supplements as directed by your caregiver.  Use splints as directed by your  caregiver. Splints help maintain joint position and function.  Do not sleep with pillows under your knees. This may lead to spasms.  Participate in a self-management program to keep current with the latest treatment and coping skills. SEEK IMMEDIATE MEDICAL CARE IF:  You have fainting episodes.  You have periods of extreme weakness.  You rapidly  develop a hot, painful joint that is more severe than usual joint aches.  You have chills.  You have a fever. MAKE SURE YOU:  Understand these instructions.  Will watch your condition.  Will get help right away if you are not doing well or get worse. FOR MORE INFORMATION  American College of Rheumatology: www.rheumatology.[place]: www.arthritis.org Document Released: 02/06/2000 Document Revised: 08/10/2011 Document Reviewed: 03/17/2011 [facility] Patient Information 2014 [place], [place].

## 2013-05-23 NOTE — Assessment & Plan Note (Signed)
Patient does have a high titer and likely does have rheumatoid arthritis with findings on physical exam today. We discussed in great detail about different types of care. Patient did not want any type of corticosteroid injection and the did not feel that I should be doing chronic pain medications for this problem. I do think a rheumatology referral would be the most beneficial. Patient actually RE has an appointment at June 14, 2013 with Dr. Ouida Sills and patient's was given this information again. Discussed over-the-counter medications that could be beneficial as was given home exercise in the interim to try to help with range of motion and muscle strengthening I would be helpful. We discussed different activities that will be less severe on his joints. The patient did discuss pain medications again in which I declined to fill. Discuss the patient's eye more than willing to see him again but he did not seem overly optimistic that I could be of any benefit.

## 2013-06-11 ENCOUNTER — Encounter: Payer: Self-pay | Admitting: Internal Medicine

## 2013-06-13 ENCOUNTER — Telehealth: Payer: Self-pay | Admitting: Cardiovascular Disease

## 2013-06-13 DIAGNOSIS — Z79899 Other long term (current) drug therapy: Secondary | ICD-10-CM

## 2013-06-13 DIAGNOSIS — E782 Mixed hyperlipidemia: Secondary | ICD-10-CM

## 2013-06-13 NOTE — Telephone Encounter (Signed)
Would like to pick up the lab order that he needs. Would like to pick it up tomorrow . Please call if you have any questions .Marland Kitchen    Thanks

## 2013-06-13 NOTE — Telephone Encounter (Signed)
Lab order printed for patient to pick up tomorrow.

## 2013-06-14 ENCOUNTER — Other Ambulatory Visit: Payer: Self-pay | Admitting: Internal Medicine

## 2013-06-14 NOTE — Telephone Encounter (Signed)
Faxed script back to [facility].../lmb 

## 2013-06-14 NOTE — Telephone Encounter (Signed)
Done hardcopy to robin  

## 2013-06-15 ENCOUNTER — Other Ambulatory Visit: Payer: Self-pay | Admitting: Cardiovascular Disease

## 2013-06-15 LAB — CBC WITH DIFFERENTIAL/PLATELET
Basophils Absolute: 0.1 10*3/uL (ref 0.0–0.1)
Basophils Relative: 1 % (ref 0–1)
EOS ABS: 0.2 10*3/uL (ref 0.0–0.7)
Eosinophils Relative: 3 % (ref 0–5)
HCT: 33.3 % — ABNORMAL LOW (ref 39.0–52.0)
Hemoglobin: 11.6 g/dL — ABNORMAL LOW (ref 13.0–17.0)
Lymphocytes Relative: 17 % (ref 12–46)
Lymphs Abs: 1.1 10*3/uL (ref 0.7–4.0)
MCH: 28.5 pg (ref 26.0–34.0)
MCHC: 34.8 g/dL (ref 30.0–36.0)
MCV: 81.8 fL (ref 78.0–100.0)
MONOS PCT: 9 % (ref 3–12)
Monocytes Absolute: 0.6 10*3/uL (ref 0.1–1.0)
Neutro Abs: 4.6 10*3/uL (ref 1.7–7.7)
Neutrophils Relative %: 70 % (ref 43–77)
PLATELETS: 497 10*3/uL — AB (ref 150–400)
RBC: 4.07 MIL/uL — AB (ref 4.22–5.81)
RDW: 13.2 % (ref 11.5–15.5)
WBC: 6.5 10*3/uL (ref 4.0–10.5)

## 2013-06-15 LAB — HEPATIC FUNCTION PANEL
ALT: 11 U/L (ref 0–53)
AST: 12 U/L (ref 0–37)
Albumin: 3.9 g/dL (ref 3.5–5.2)
Alkaline Phosphatase: 78 U/L (ref 39–117)
BILIRUBIN INDIRECT: 0.2 mg/dL (ref 0.2–1.2)
BILIRUBIN TOTAL: 0.3 mg/dL (ref 0.2–1.2)
Bilirubin, Direct: 0.1 mg/dL (ref 0.0–0.3)
Total Protein: 7.3 g/dL (ref 6.0–8.3)

## 2013-06-15 LAB — LIPID PANEL
CHOLESTEROL: 205 mg/dL — AB (ref 0–200)
HDL: 38 mg/dL — ABNORMAL LOW (ref >39)
LDL Cholesterol: 148 mg/dL — ABNORMAL HIGH (ref 0–99)
Total CHOL/HDL Ratio: 5.4 Ratio
Triglycerides: 94 mg/dL (ref <150)
VLDL: 19 mg/dL (ref 0–40)

## 2013-06-16 LAB — HEPATITIS PANEL, ACUTE
HCV Ab: NEGATIVE
HEP A IGM: NONREACTIVE
Hep B C IgM: NONREACTIVE
Hepatitis B Surface Ag: NEGATIVE

## 2013-06-21 ENCOUNTER — Telehealth: Payer: Self-pay | Admitting: *Deleted

## 2013-06-21 ENCOUNTER — Other Ambulatory Visit: Payer: Self-pay | Admitting: *Deleted

## 2013-06-21 DIAGNOSIS — E782 Mixed hyperlipidemia: Secondary | ICD-10-CM

## 2013-06-21 DIAGNOSIS — Z79899 Other long term (current) drug therapy: Secondary | ICD-10-CM

## 2013-06-21 NOTE — Telephone Encounter (Signed)
Lab results called to patient.  He had been off Pravastatin at least 3 weeks prior to labs being drawn due to "rheumatism".  Restarted about one week ago after getting a steroid shot for the pain.  Will have labs redrawn in August unless Dr. Loletha Grayer wants him to do something else.  Patient voiced understanding.

## 2013-06-21 NOTE — Telephone Encounter (Signed)
Message copied by Tressa Busman on Thu Jun 21, 2013  8:41 AM ------      Message from: Sanda Klein      Created: Sat Jun 16, 2013  8:23 AM       Liver tests are normal. Cholesterol is high.       Pamala Hurry, is he taking the pravastatin? If so, have to find a way to put him back on Crestor - his lipids were much better on it and he seemed to tolerate well. I think stopped due to high cost. Maybe we can find a patient assistance program ------

## 2013-06-21 NOTE — Telephone Encounter (Signed)
OK 

## 2013-07-17 ENCOUNTER — Other Ambulatory Visit: Payer: Self-pay | Admitting: Internal Medicine

## 2013-07-17 NOTE — Telephone Encounter (Signed)
Faxed hardcopy to [facility] 

## 2013-07-17 NOTE — Telephone Encounter (Signed)
Done hardcopy to robin  

## 2013-07-24 ENCOUNTER — Ambulatory Visit (INDEPENDENT_AMBULATORY_CARE_PROVIDER_SITE_OTHER): Payer: Medicare Other | Admitting: Internal Medicine

## 2013-07-24 ENCOUNTER — Encounter: Payer: Self-pay | Admitting: Internal Medicine

## 2013-07-24 ENCOUNTER — Encounter: Payer: Self-pay | Admitting: Student

## 2013-07-24 VITALS — BP 146/58 | HR 65 | Temp 98.0°F | Ht 70.0 in | Wt 163.8 lb

## 2013-07-24 DIAGNOSIS — E785 Hyperlipidemia, unspecified: Secondary | ICD-10-CM

## 2013-07-24 DIAGNOSIS — M069 Rheumatoid arthritis, unspecified: Secondary | ICD-10-CM

## 2013-07-24 DIAGNOSIS — I10 Essential (primary) hypertension: Secondary | ICD-10-CM

## 2013-07-24 NOTE — Progress Notes (Signed)
   Subjective:    Patient ID: Phillip [name], male    DOB: 06/30/1945, 68 y.o.   MRN: [id number]  HPI Pt states he is here for f/u but is not really sure what the f/u is concerning. He was referred to Dr. Charlann Boxer by Dr. Judi Cong for his arthritis. The pt states that this referral was of no benefit. He instead has sought care for his RA with Dr. Leafy Kindle, a rheumatologist who has treated his RA with a 5 week course of MTX that he finished approximately 07/16/13. He was also started on Prednisone 5mg  daily. He reports significant improvement with his arthritic pain. He still takes Percocet 5/325 BID for the pain. The pt is to see Dr. Annamaria Boots in august.   Per pt he stopped taking the lipitor as it was causing weakness in his legs.   Review of Systems  Constitutional: Negative for fever and unexpected weight change.  Eyes: Negative for visual disturbance.       Pt has had one cataract surgery. He has not had an eye exam yet this year   Respiratory: Negative for chest tightness, shortness of breath and wheezing.   Cardiovascular: Negative for chest pain, palpitations and leg swelling.  Neurological: Positive for headaches. Negative for dizziness.       Pt reports he sometimes has headaches and changes of vision but he does not take his BP at home because he is "afraid of what it might show".        Objective:   Physical Exam        Assessment & Plan:

## 2013-07-24 NOTE — Progress Notes (Signed)
   Subjective:    Patient ID: Phillip [name], male    DOB: December 31, 1945, 68 y.o.   MRN: [id number]  HPI  He is here in followup of his rheumatoid arthritis as well as dyslipidemia.  He has seen  Dr Merril Abbe and has been placed on methotrexate weekly  X 5 as well as Prednisone daily     Review of Systems Lipitor was stopped because of  possible myalgias . He was switched to pravastatin 80 mg daily by his Cardilogist. He's been taking 2 every other day to "catch up".  He has not been monitoring his blood pressure at home. He states he is "afraid of the possible findings".     Objective:   Physical Exam   He appears well-nourished in no distress  He has no scleral icterus or jaundice  He does have significant tonsillar stones.  He has flexion changes of the fingers as well as PIP enlargement. There is some lateral deviation of the hands  There is dullness to percussion in the right upper quadrant without definitive organomegaly Head: Normocephalic without obvious abnormalities; Eyes: No corneal or conjunctival inflammation noted. Ears: External  ear exam reveals no significant lesions or deformities.  Nose: External nasal exam reveals no deformity or inflammation. Nasal mucosa are pink and moist. No lesions or exudates noted.   Mouth: Teeth in good repair. Neck: No deformities, masses, or tenderness noted.  Lungs: Normal respiratory effort; chest expands symmetrically. Lungs are clear to auscultation without rales, wheezes, or increased work of breathing. Heart: Normal rate and rhythm. Normal S1 and S2. No gallop, click, or rub. No murmur. Abdomen: Bowel sounds normal; abdomen soft and nontender.                                   Musculoskeletal/extremities: No deformity or scoliosis noted of  the thoracic or lumbar spine. deformity noted.  Tone & strength normal. Fingernail health good. Vascular: Carotid, radial artery, dorsalis pedis and  posterior tibial  pulses are full and equal. No bruits present. Neurologic: Alert and oriented x3. Deep tendon reflexes symmetrical and normal.  Gait normal        Skin: Intact without suspicious lesions or rashes. Lymph: No cervical, axillary lymphadenopathy present. Psych: Mood and affect are normal. Normally interactive                                                                                        Assessment & Plan:  #1 dyslipidemia; noncompliance with recommended dosing of pravastatin. This was discussed with him. He will followup with his cardiologist for fasting lipids, hepatic panel, CK after 10-12 weeks of pravastatin 80 mg at bedtime daily.  #2 A water PIC was recommended to help dislodge the tonsillar stones. He has no interest in pursuing this  #3 His rheumatoid arthritis will be followed by Leafy Kindle.

## 2013-07-24 NOTE — Patient Instructions (Addendum)
Please  schedule fasting Labs after 10-12 weeks after Pravastatin 80 mg @ bedtime @ your Cardiologist's office.  Please review Dr Nunzio Cory book Eat, [place] for best  dietary cholesterol information.  Cardiovascular exercise, this can be as simple a program as walking, is recommended 30-45 minutes 3-4 times per week. If you're not exercising you should take 6-8 weeks to build up to this level.  Minimal Blood Pressure Goal= AVERAGE < 140/90;  Ideal is an AVERAGE < 135/85. This AVERAGE should be calculated from @ least 5-7 BP readings taken @ different times of day on different days of week. You should not respond to isolated BP readings , but rather the AVERAGE for that week .Please bring your  blood pressure cuff to office visits to verify that it is reliable.It  can also be checked against the blood pressure device at the pharmacy. Finger or wrist cuffs are not dependable; an arm cuff is.

## 2013-07-24 NOTE — Progress Notes (Signed)
Pre visit review using our clinic review tool, if applicable. No additional management support is needed unless otherwise documented below in the visit note. 

## 2013-07-25 ENCOUNTER — Telehealth: Payer: Self-pay | Admitting: Internal Medicine

## 2013-07-25 NOTE — Telephone Encounter (Signed)
Relevant patient education assigned to patient using Emmi. ° °

## 2013-09-11 ENCOUNTER — Telehealth: Payer: Self-pay

## 2013-09-11 MED ORDER — HYDROXYZINE HCL 25 MG PO TABS: 25.0000 mg | ORAL_TABLET | Freq: Three times a day (TID) | ORAL | Status: DC | PRN

## 2013-09-11 NOTE — Telephone Encounter (Signed)
Done erx 

## 2013-09-11 NOTE — Telephone Encounter (Signed)
Pharmacy requesting refill on Hydroxyzine HCL 25 mg not on current list

## 2013-09-17 ENCOUNTER — Other Ambulatory Visit: Payer: Self-pay | Admitting: Internal Medicine

## 2013-09-18 NOTE — Telephone Encounter (Signed)
Done hardcopy to robin  

## 2013-09-18 NOTE — Telephone Encounter (Signed)
Faxed hardcopy to [facility].

## 2013-10-10 LAB — COMPREHENSIVE METABOLIC PANEL
ALBUMIN: 4.1 g/dL (ref 3.5–5.2)
ALT: 13 U/L (ref 0–53)
AST: 15 U/L (ref 0–37)
Alkaline Phosphatase: 57 U/L (ref 39–117)
BUN: 8 mg/dL (ref 6–23)
CO2: 25 meq/L (ref 19–32)
Calcium: 9 mg/dL (ref 8.4–10.5)
Chloride: 99 mEq/L (ref 96–112)
Creat: 0.61 mg/dL (ref 0.50–1.35)
GLUCOSE: 106 mg/dL — AB (ref 70–99)
Potassium: 4.2 mEq/L (ref 3.5–5.3)
SODIUM: 133 meq/L — AB (ref 135–145)
Total Bilirubin: 0.3 mg/dL (ref 0.2–1.2)
Total Protein: 6.8 g/dL (ref 6.0–8.3)

## 2013-10-10 LAB — LIPID PANEL
Cholesterol: 169 mg/dL (ref 0–200)
HDL: 49 mg/dL (ref >39)
LDL CALC: 93 mg/dL (ref 0–99)
TRIGLYCERIDES: 137 mg/dL (ref <150)
Total CHOL/HDL Ratio: 3.4 Ratio
VLDL: 27 mg/dL (ref 0–40)

## 2013-10-23 ENCOUNTER — Other Ambulatory Visit: Payer: Self-pay

## 2013-10-23 MED ORDER — CARVEDILOL 25 MG PO TABS: 25.0000 mg | ORAL_TABLET | Freq: Two times a day (BID) | ORAL | Status: DC

## 2013-10-23 NOTE — Telephone Encounter (Signed)
Rx was sent to pharmacy electronically. 

## 2013-11-16 ENCOUNTER — Other Ambulatory Visit: Payer: Self-pay

## 2013-11-16 ENCOUNTER — Other Ambulatory Visit: Payer: Self-pay | Admitting: Internal Medicine

## 2013-11-16 MED ORDER — HYDROXYZINE HCL 25 MG PO TABS: 25.0000 mg | ORAL_TABLET | Freq: Three times a day (TID) | ORAL | Status: DC | PRN

## 2013-12-07 ENCOUNTER — Other Ambulatory Visit: Payer: Self-pay

## 2013-12-17 ENCOUNTER — Ambulatory Visit (INDEPENDENT_AMBULATORY_CARE_PROVIDER_SITE_OTHER): Payer: Medicare Other | Admitting: Cardiovascular Disease

## 2013-12-17 ENCOUNTER — Encounter: Payer: Self-pay | Admitting: Cardiovascular Disease

## 2013-12-17 VITALS — BP 144/88 | HR 64 | Resp 16 | Ht 70.0 in | Wt 163.0 lb

## 2013-12-17 DIAGNOSIS — I10 Essential (primary) hypertension: Secondary | ICD-10-CM

## 2013-12-17 DIAGNOSIS — I2510 Atherosclerotic heart disease of native coronary artery without angina pectoris: Secondary | ICD-10-CM

## 2013-12-17 DIAGNOSIS — R079 Chest pain, unspecified: Secondary | ICD-10-CM

## 2013-12-17 DIAGNOSIS — I208 Other forms of angina pectoris: Secondary | ICD-10-CM

## 2013-12-17 DIAGNOSIS — E782 Mixed hyperlipidemia: Secondary | ICD-10-CM

## 2013-12-17 DIAGNOSIS — Z955 Presence of coronary angioplasty implant and graft: Secondary | ICD-10-CM

## 2013-12-17 MED ORDER — AMLODIPINE BESYLATE 2.5 MG PO TABS: 2.5000 mg | ORAL_TABLET | Freq: Every day | ORAL | Status: DC

## 2013-12-17 NOTE — Progress Notes (Signed)
Patient ID: Phillip [name], male   DOB: 09/07/1945, 68 y.o.   MRN: [id number]     Reason for office visit Angina pectoris  [name] has had gradual recurrence of chest discomfort similar to his previous angina. This has kicked in insidiously over the last 6 weeks or so. He primarily notices it when he walks up a steep hill [place] the [place] on his [place] trips. It does not happen when he walks on level ground. It may sometimes occur when he is doing more intense yard work. It promptly resolves when he stops to rest. It reminds him of his previous coronary problems. He denies other cardiac complaints. He occasionally takes Viagra or Cialis. He had a recent lipid profile showed an LDL cholesterol of 93 and triglycerides of 137 on combination treatment with fenofibrate and pravastatin. He has not tolerated other statins. He received drug-eluting stents in 2008 (3.028 drug-eluting Cypher to proximal ramus intermedius) and 2010 (2.513 drug-eluting Cypher to OM 1 branch of left circumflex; IVUS showed 40% ostial left main, 60% proximal LAD, 50% mid LAD) and had a normal perfusion study in 2012. He has normal left ventricular systolic function with an ejection fraction of 55% by scintigraphy in 2012    Allergies  Allergen Reactions  . Lipitor [Atorvastatin] Other (See Comments)    myalgia    Current Outpatient Prescriptions  Medication Sig Dispense Refill  . ALPRAZolam (XANAX) 1 MG tablet TAKE 1/2 TO 1 TABLET BY MOUTH TWICE DAILY AS NEEDED  60 tablet  2  . aspirin 81 MG tablet Take 81 mg by mouth daily.      . carbamazepine (TEGRETOL) 200 MG tablet Take 1 tablet (200 mg total) by mouth 2 (two) times daily.  180 tablet  3  . carvedilol (COREG) 25 MG tablet Take 1 tablet (25 mg total) by mouth 2 (two) times daily with a meal.  60 tablet  4  . clobetasol cream (TEMOVATE) 6.07 % Apply 1 application topically 2 (two) times daily. As needed  60 g  1  . clopidogrel (PLAVIX) 75 MG tablet TAKE 1 TABLET BY  MOUTH DAILY  90 tablet  1  . Cyanocobalamin (VITAMIN B 12 PO) Take by mouth.      . fenofibrate 54 MG tablet TAKE 1 TABLET BY MOUTH DAILY  90 tablet  3  . folic acid (FOLVITE) 1 MG tablet Take 1 tablet (1 mg total) by mouth daily.  90 tablet  3  . HYDROcodone-acetaminophen (NORCO/VICODIN) 5-325 MG per tablet Take 1 tablet by mouth 4 (four) times daily as needed. To fill Mar 16, 2013  120 tablet  0  . hydrOXYzine (ATARAX/VISTARIL) 25 MG tablet Take 1 tablet (25 mg total) by mouth 3 (three) times daily as needed.  60 tablet  1  . leflunomide (ARAVA) 20 MG tablet Take 1 tablet by mouth daily.      . Omega-3 Fatty Acids (FISH OIL PO) Take by mouth daily.      . pantoprazole (PROTONIX) 40 MG tablet Take 1 tablet (40 mg total) by mouth 2 (two) times daily.  180 tablet  3  . pravastatin (PRAVACHOL) 80 MG tablet Take 1 tablet (80 mg total) by mouth daily.  90 tablet  3  . predniSONE (DELTASONE) 5 MG tablet Take 5 mg by mouth daily with breakfast. Take one tablet daily      . ramipril (ALTACE) 2.5 MG capsule Take 1 capsule (2.5 mg total) by mouth daily. Take 2.5 mg by  mouth daily.  90 capsule  3  . sildenafil (REVATIO) 20 MG tablet Take 1 tablet (20 mg total) by mouth 2 (two) times daily as needed.  60 tablet  2  . zolpidem (AMBIEN) 10 MG tablet TAKE 1 TABLET BY MOUTH EVERY NIGHT AT BEDTIME AS NEEDED FOR SLEEP  30 tablet  5   No current facility-administered medications for this visit.    Past Medical History  Diagnosis Date  . Arthritis   . Hypertension   . Hyperlipidemia   . Anxiety   . Depression   . CAD, multiple vessel   . History of colon polyps   . Insomnia   . Bipolar depression   . GERD (gastroesophageal reflux disease)   . Chronic pain disorder     chronic bilat shoulder pain and feet pain  . Stented coronary artery 01/16/2013    X 2  . Iron deficiency anemia 01/16/2013  . Unspecified vitamin D deficiency 01/17/2013  . Erectile dysfunction 01/17/2013  . Mixed hyperlipidemia  03/08/2013    Past Surgical History  Procedure Laterality Date  . Eye surgery    . Coronary angioplasty with stent placement  about 2014    current Cardiology: Dr Orene Desanctis    Family History  Problem Relation Age of Onset  . Cancer Mother   . Heart disease Mother   . Heart disease Maternal Grandmother   . Stroke Maternal Grandmother   . Hyperlipidemia Maternal Grandmother     History   Social History  . Marital Status: Single    Spouse Name: N/A    Number of Children: N/A  . Years of Education: N/A   Occupational History  . Not on file.   Social History Main Topics  . Smoking status: Former Smoker -- 1.00 packs/day for 20 years    Types: Cigarettes    Quit date: 02/22/2002  . Smokeless tobacco: Current User    Types: Chew  . Alcohol Use: Yes  . Drug Use: No  . Sexual Activity: Not on file   Other Topics Concern  . Not on file   Social History Narrative  . No narrative on file    Review of systems: Exertional angina pectoris, CCS class II. Erectile dysfunction. The patient specifically denies any chest pain at rest, dyspnea at rest or with exertion, orthopnea, paroxysmal nocturnal dyspnea, syncope, palpitations, focal neurological deficits, intermittent claudication, lower extremity edema, unexplained weight gain, cough, hemoptysis or wheezing.  The patient also denies abdominal pain, nausea, vomiting, dysphagia, diarrhea, constipation, polyuria, polydipsia, dysuria, hematuria, frequency, urgency, abnormal bleeding or bruising, fever, chills, unexpected weight changes, mood swings, change in skin or hair texture, change in voice quality, auditory or visual problems, allergic reactions or rashes, new musculoskeletal complaints other than usual "aches and pains".   PHYSICAL EXAM BP 144/88  Pulse 64  Resp 16  Ht 5' 10" (1.778 m)  Wt 163 lb (73.936 kg)  BMI 23.39 kg/m2  General: Alert, oriented x3, no distress Head: no evidence of trauma, PERRL, EOMI, no  exophtalmos or lid lag, no myxedema, no xanthelasma; normal ears, nose and oropharynx Neck: normal jugular venous pulsations and no hepatojugular reflux; brisk carotid pulses without delay and no carotid bruits Chest: clear to auscultation, no signs of consolidation by percussion or palpation, normal fremitus, symmetrical and full respiratory excursions Cardiovascular: normal position and quality of the apical impulse, regular rhythm, normal first and second heart sounds, no murmurs, rubs or gallops Abdomen: no tenderness or distention, no masses by palpation,  no abnormal pulsatility or arterial bruits, normal bowel sounds, no hepatosplenomegaly Extremities: no clubbing, cyanosis or edema; 2+ radial, ulnar and brachial pulses bilaterally; 2+ right femoral, posterior tibial and dorsalis pedis pulses; 2+ left femoral, posterior tibial and dorsalis pedis pulses; no subclavian or femoral bruits Neurological: grossly nonfocal   BUL:AGTXMI sinus rhythm, normal tracing  Lipid Panel     Component Value Date/Time   CHOL 169 10/09/2013 0920   TRIG 137 10/09/2013 0920   HDL 49 10/09/2013 0920   CHOLHDL 3.4 10/09/2013 0920   VLDL 27 10/09/2013 0920   LDLCALC 93 10/09/2013 0920   LDLDIRECT 142.5 01/16/2013 1150    BMET    Component Value Date/Time   NA 133* 10/09/2013 1425   K 4.2 10/09/2013 1425   CL 99 10/09/2013 1425   CO2 25 10/09/2013 1425   GLUCOSE 106* 10/09/2013 1425   BUN 8 10/09/2013 1425   CREATININE 0.61 10/09/2013 1425   CREATININE 0.7 01/16/2013 1150   CALCIUM 9.0 10/09/2013 1425   GFRNONAA >60 09/17/2008 0425   GFRAA  Value: >60        The eGFR has been calculated using the MDRD equation. This calculation has not been validated in all clinical situations. eGFR's persistently <60 mL/min signify possible Chronic Kidney Disease. 09/17/2008 0425     ASSESSMENT AND PLAN CAD, multiple vessel  New symptoms of active coronary insufficiency. Schedule for a treadmill Myoview. If there is a large  area of myocardial ischemia he should undergo repeat cardiac catheterization. Particularly concerned about the moderate grade stenosis in the LAD artery and distal left main. He has previously received stents to the ramus intermedius artery and the first oblique marginal artery with good long-term results. If there is not a large area of ischemia, focus on medical therapy. Will add amlodipine 2.5 mg daily.     Mixed hyperlipidemia  He has both elevated triglycerides and total cholesterol and LDL cholesterol. Unfortunately he could not afford Crestor and had severe myalgias with Lipitor. he is tolerating the pravastatin, but this is much weaker and he has not achieved his target LDL (<70) and non-HDL (<130) levels. We will have to switch back to Crestonce this medication becomes available as a genericy  Hypertension  Fair control -  Add amlodipine  Erectile dysfunction  Reminded to carefully avoid any simultaneous use of nitrates and sildenafil due to the risk of potentially fatal hypotension.  Orders Placed This Encounter  Procedures  . EKG 12-Lead   Meds ordered this encounter  Medications  . leflunomide (ARAVA) 20 MG tablet    Sig: Take 1 tablet by mouth daily.    Holli Humbles, MD, [place] [phone] office [phone] pager

## 2013-12-17 NOTE — Patient Instructions (Addendum)
Your physician has requested that you have en exercise stress myoview. For further information please visit [email or web address]. Please follow instruction sheet, as given.  ADD Amlodipine 2.5mg  daily.  Dr. Sallyanne Kuster recommends that you schedule a follow-up appointment in: 6 months.

## 2013-12-19 ENCOUNTER — Telehealth (HOSPITAL_COMMUNITY): Payer: Self-pay

## 2013-12-19 NOTE — Telephone Encounter (Signed)
Encounter complete. 

## 2013-12-21 ENCOUNTER — Ambulatory Visit (HOSPITAL_COMMUNITY)
Admission: RE | Admit: 2013-12-21 | Discharge: 2013-12-21 | Disposition: A | Discharge status: Home / Self Care | Payer: Medicare Other | Source: Ambulatory Visit | Attending: Cardiology | Admitting: Cardiology

## 2013-12-21 ENCOUNTER — Other Ambulatory Visit: Payer: Self-pay | Admitting: Internal Medicine

## 2013-12-21 DIAGNOSIS — R079 Chest pain, unspecified: Secondary | ICD-10-CM

## 2013-12-21 NOTE — Telephone Encounter (Signed)
MD is out of office until next Tues. Pls advise...Johny Chess

## 2013-12-21 NOTE — Telephone Encounter (Signed)
Refilled enough for Dr. Gwynn Burly return.

## 2013-12-25 ENCOUNTER — Other Ambulatory Visit: Payer: Self-pay

## 2013-12-25 MED ORDER — ZOLPIDEM TARTRATE 10 MG PO TABS: ORAL_TABLET | ORAL | Status: DC

## 2013-12-25 NOTE — Telephone Encounter (Signed)
Faxed hardcopy for zolpidem to [facility]

## 2013-12-25 NOTE — Telephone Encounter (Signed)
Done hardcopy to robin  

## 2013-12-26 ENCOUNTER — Telehealth (HOSPITAL_COMMUNITY): Payer: Self-pay

## 2013-12-26 ENCOUNTER — Telehealth: Payer: Self-pay | Admitting: *Deleted

## 2013-12-26 MED ORDER — ALPRAZOLAM 1 MG PO TABS: ORAL_TABLET | ORAL | Status: DC

## 2013-12-26 NOTE — Telephone Encounter (Signed)
Faxed corrected #60 for Alprazolam to [facility] and called the patient informed

## 2013-12-26 NOTE — Telephone Encounter (Signed)
Done hardcopy to robin  

## 2013-12-26 NOTE — Telephone Encounter (Signed)
Encounter complete. 

## 2013-12-26 NOTE — Telephone Encounter (Signed)
Left msg on triage stating he only received #10 on his xanax. MD normally gives him #60. Pt is requesting full amount...Phillip [name]

## 2013-12-27 ENCOUNTER — Other Ambulatory Visit (HOSPITAL_COMMUNITY): Payer: Self-pay | Admitting: Cardiovascular Disease

## 2013-12-27 DIAGNOSIS — R0789 Other chest pain: Secondary | ICD-10-CM

## 2013-12-28 ENCOUNTER — Ambulatory Visit (HOSPITAL_COMMUNITY)
Admission: RE | Admit: 2013-12-28 | Discharge: 2013-12-28 | Disposition: A | Discharge status: Home / Self Care | Payer: Medicare Other | Source: Ambulatory Visit | Attending: Cardiology | Admitting: Cardiology

## 2013-12-28 DIAGNOSIS — R42 Dizziness and giddiness: Secondary | ICD-10-CM | POA: Insufficient documentation

## 2013-12-28 DIAGNOSIS — Z87891 Personal history of nicotine dependence: Secondary | ICD-10-CM | POA: Insufficient documentation

## 2013-12-28 DIAGNOSIS — E785 Hyperlipidemia, unspecified: Secondary | ICD-10-CM | POA: Diagnosis not present

## 2013-12-28 DIAGNOSIS — R5383 Other fatigue: Secondary | ICD-10-CM | POA: Diagnosis not present

## 2013-12-28 DIAGNOSIS — R0789 Other chest pain: Secondary | ICD-10-CM

## 2013-12-28 DIAGNOSIS — Z8249 Family history of ischemic heart disease and other diseases of the circulatory system: Secondary | ICD-10-CM | POA: Diagnosis not present

## 2013-12-28 DIAGNOSIS — R0609 Other forms of dyspnea: Secondary | ICD-10-CM | POA: Diagnosis not present

## 2013-12-28 DIAGNOSIS — R002 Palpitations: Secondary | ICD-10-CM | POA: Insufficient documentation

## 2013-12-28 DIAGNOSIS — R079 Chest pain, unspecified: Secondary | ICD-10-CM | POA: Insufficient documentation

## 2013-12-28 DIAGNOSIS — I10 Essential (primary) hypertension: Secondary | ICD-10-CM | POA: Insufficient documentation

## 2013-12-28 MED ORDER — TECHNETIUM TC 99M SESTAMIBI GENERIC - CARDIOLITE
30.9000 | Freq: Once | INTRAVENOUS | Status: AC | PRN
  Administered 2013-12-28: 30.9 via INTRAVENOUS

## 2013-12-28 MED ORDER — TECHNETIUM TC 99M SESTAMIBI GENERIC - CARDIOLITE
10.2000 | Freq: Once | INTRAVENOUS | Status: AC | PRN
  Administered 2013-12-28: 10 via INTRAVENOUS

## 2013-12-28 MED ORDER — AMINOPHYLLINE 25 MG/ML IV SOLN
75.0000 mg | Freq: Once | INTRAVENOUS | Status: AC
  Administered 2013-12-28: 75 mg via INTRAVENOUS

## 2013-12-28 MED ORDER — REGADENOSON 0.4 MG/5ML IV SOLN
0.4000 mg | Freq: Once | INTRAVENOUS | Status: AC
  Administered 2013-12-28: 0.4 mg via INTRAVENOUS

## 2013-12-28 NOTE — Procedures (Addendum)
[facility] [address] [place] [address] [phone]  Cardiology Nuclear Med Study  Phillip [name] is a 68 y.o. male     MRN : [id number]     DOB: 03-Oct-1945  Procedure Date: 12/28/2013  Nuclear Med Background Indication for Stress Test:  Evaluation for Ischemia History:  CAD;STENT/PTCA-2008, 2010 AND 01/16/2013;Last NUC MPI on 02/20/11-nonischemic;EF=55% Cardiac Risk Factors: Family History - CAD, History of Smoking, Hypertension and Lipids  Symptoms:  Chest Pain, DOE, Fatigue, Light-Headedness and Palpitations   Nuclear Pre-Procedure Caffeine/Decaff Intake:  7:00pm NPO After: 5:00am   IV Site: R Hand  IV 0.9% NS with Angio Cath:  22g  Chest Size (in):  40"  IV Started by: Rolene Course, RN  Height: 5\' 10"  (1.778 m)  Cup Size: n/a  BMI:  Body mass index is 23.39 kg/(m^2). Weight:  163 lb (73.936 kg)   Tech Comments:  Pt. Changed to [place] d/t inability to walk. tmm    Nuclear Med Study 1 or 2 day study: 1 day  Stress Test Type:  [employer] Provider:  Sanda Klein, MD   Resting Radionuclide: Technetium 32m Sestamibi  Resting Radionuclide Dose: 10.2 mCi   Stress Radionuclide:  Technetium 85m Sestamibi  Stress Radionuclide Dose: 30.9 mCi           Stress Protocol Rest HR:67 Stress HR:  93  Rest BP:  162/90 Stress BP:  153/71  Exercise Time (min): n/a METS: n/a   Predicted Max HR: 152 bpm % Max HR: 61.18 bpm Rate Pressure Product: 15438  Dose of Adenosine (mg):  n/a Dose of Lexiscan: 0.4 mg  Dose of Atropine (mg): n/a Dose of Dobutamine: n/a mcg/kg/min (at max HR)  Stress Test Technologist: Leane Para, CCT Nuclear Technologist: Imagene Riches, CNMT   Rest Procedure:  Myocardial perfusion imaging was performed at rest 45 minutes following the intravenous administration of Technetium 66m Sestamibi. Stress Procedure:  The patient received IV Lexiscan 0.4 mg over 15-seconds.   Technetium 58m Sestamibi injected IV at 30-seconds.  Patient experienced SOB and chest tightness and 75 mg Aminophylline IV was administered.  There were no significant changes with Lexiscan.  Quantitative spect images were obtained after a 45 minute delay.  Transient Ischemic Dilatation (Normal <1.22):  1.13  QGS EDV:  109 ml QGS ESV:  50 ml LV Ejection Fraction: 54%       Rest ECG: NSR - Normal EKG  Stress ECG: No significant change from baseline ECG  QPS Raw Data Images:  Normal; no motion artifact; normal heart/lung ratio. Stress Images:  small in size and mild in intensity basal inferior defect suggestive of attenuation Rest Images:  Mild mid to basal inferior attenuation Subtraction (SDS):  No evidence of ischemia.  Impression Exercise Capacity:  Lexiscan with no exercise. BP Response:  Normal blood pressure response. Clinical Symptoms:  Mild shortness of breath and chest sensation ECG Impression:  No significant ST segment change suggestive of ischemia. Comparison with Prior Nuclear Study: No significant change from previous study  Overall Impression:  Low risk stress nuclear study demonstrating a small region of mild basal inferior attenuation without scar or ischemia.  LV Wall Motion:  NL LV Function, EF 54%; NL Wall Motion   KELLY,THOMAS A, MD  12/29/2013 11:35 AM

## 2014-01-22 ENCOUNTER — Other Ambulatory Visit: Payer: Self-pay | Admitting: Internal Medicine

## 2014-01-22 ENCOUNTER — Other Ambulatory Visit: Payer: Self-pay

## 2014-01-22 MED ORDER — HYDROXYZINE HCL 25 MG PO TABS
25.0000 mg | ORAL_TABLET | Freq: Three times a day (TID) | ORAL | Status: DC | PRN
Start: 2014-01-22 — End: 2014-03-05

## 2014-02-19 ENCOUNTER — Ambulatory Visit: Payer: Medicare Other | Admitting: Internal Medicine

## 2014-02-27 ENCOUNTER — Encounter: Payer: Self-pay | Admitting: Internal Medicine

## 2014-02-27 ENCOUNTER — Other Ambulatory Visit (INDEPENDENT_AMBULATORY_CARE_PROVIDER_SITE_OTHER): Payer: Medicare Other

## 2014-02-27 ENCOUNTER — Ambulatory Visit (INDEPENDENT_AMBULATORY_CARE_PROVIDER_SITE_OTHER): Payer: Medicare Other | Admitting: Internal Medicine

## 2014-02-27 VITALS — BP 122/68 | HR 76 | Temp 98.0°F | Ht 70.0 in | Wt 161.0 lb

## 2014-02-27 DIAGNOSIS — E119 Type 2 diabetes mellitus without complications: Secondary | ICD-10-CM | POA: Insufficient documentation

## 2014-02-27 DIAGNOSIS — E785 Hyperlipidemia, unspecified: Secondary | ICD-10-CM

## 2014-02-27 DIAGNOSIS — Z23 Encounter for immunization: Secondary | ICD-10-CM

## 2014-02-27 DIAGNOSIS — Z0000 Encounter for general adult medical examination without abnormal findings: Secondary | ICD-10-CM

## 2014-02-27 LAB — CBC WITH DIFFERENTIAL/PLATELET
BASOS ABS: 0 10*3/uL (ref 0.0–0.1)
BASOS PCT: 0.3 % (ref 0.0–3.0)
Eosinophils Absolute: 0.2 10*3/uL (ref 0.0–0.7)
Eosinophils Relative: 2.9 % (ref 0.0–5.0)
HCT: 35 % — ABNORMAL LOW (ref 39.0–52.0)
HEMOGLOBIN: 11.4 g/dL — AB (ref 13.0–17.0)
LYMPHS ABS: 0.8 10*3/uL (ref 0.7–4.0)
Lymphocytes Relative: 12.5 % (ref 12.0–46.0)
MCHC: 32.7 g/dL (ref 30.0–36.0)
MCV: 89.1 fl (ref 78.0–100.0)
MONOS PCT: 8.1 % (ref 3.0–12.0)
Monocytes Absolute: 0.5 10*3/uL (ref 0.1–1.0)
NEUTROS PCT: 76.2 % (ref 43.0–77.0)
Neutro Abs: 5.1 10*3/uL (ref 1.4–7.7)
Platelets: 373 10*3/uL (ref 150.0–400.0)
RBC: 3.93 Mil/uL — AB (ref 4.22–5.81)
RDW: 13.6 % (ref 11.5–15.5)
WBC: 6.7 10*3/uL (ref 4.0–10.5)

## 2014-02-27 LAB — PSA: PSA: 0.6 ng/mL (ref 0.10–4.00)

## 2014-02-27 LAB — URINALYSIS, ROUTINE W REFLEX MICROSCOPIC
Bilirubin Urine: NEGATIVE
HGB URINE DIPSTICK: NEGATIVE
KETONES UR: NEGATIVE
Leukocytes, UA: NEGATIVE
Nitrite: NEGATIVE
RBC / HPF: NONE SEEN (ref 0–?)
Specific Gravity, Urine: >=1.03 — AB (ref 1.000–1.030)
Total Protein, Urine: NEGATIVE
UROBILINOGEN UA: 0.2 (ref 0.0–1.0)
Urine Glucose: 500 — AB
pH: 5.5 (ref 5.0–8.0)

## 2014-02-27 LAB — BASIC METABOLIC PANEL
BUN: 8 mg/dL (ref 6–23)
CHLORIDE: 96 meq/L (ref 96–112)
CO2: 26 meq/L (ref 19–32)
CREATININE: 0.7 mg/dL (ref 0.4–1.5)
Calcium: 8.7 mg/dL (ref 8.4–10.5)
GFR: 125.06 mL/min (ref >60.00)
GLUCOSE: 173 mg/dL — AB (ref 70–99)
Potassium: 4 mEq/L (ref 3.5–5.1)
SODIUM: 130 meq/L — AB (ref 135–145)

## 2014-02-27 LAB — HEPATIC FUNCTION PANEL
ALT: 16 U/L (ref 0–53)
AST: 20 U/L (ref 0–37)
Albumin: 3.9 g/dL (ref 3.5–5.2)
Alkaline Phosphatase: 56 U/L (ref 39–117)
BILIRUBIN TOTAL: 0.6 mg/dL (ref 0.2–1.2)
Bilirubin, Direct: 0.1 mg/dL (ref 0.0–0.3)
Total Protein: 7 g/dL (ref 6.0–8.3)

## 2014-02-27 LAB — LIPID PANEL
Cholesterol: 223 mg/dL — ABNORMAL HIGH (ref 0–200)
HDL: 40.6 mg/dL (ref >39.00)
NONHDL: 182.4
TRIGLYCERIDES: 206 mg/dL — AB (ref 0.0–149.0)
Total CHOL/HDL Ratio: 5
VLDL: 41.2 mg/dL — AB (ref 0.0–40.0)

## 2014-02-27 LAB — LDL CHOLESTEROL, DIRECT: LDL DIRECT: 142.5 mg/dL

## 2014-02-27 LAB — TSH: TSH: 1.9 u[IU]/mL (ref 0.35–4.50)

## 2014-02-27 MED ORDER — RAMIPRIL 2.5 MG PO CAPS: 2.5000 mg | ORAL_CAPSULE | Freq: Every day | ORAL | Status: DC

## 2014-02-27 MED ORDER — PANTOPRAZOLE SODIUM 40 MG PO TBEC: 40.0000 mg | DELAYED_RELEASE_TABLET | Freq: Two times a day (BID) | ORAL | Status: DC

## 2014-02-27 MED ORDER — PRAVASTATIN SODIUM 80 MG PO TABS: 80.0000 mg | ORAL_TABLET | Freq: Every day | ORAL | Status: DC

## 2014-02-27 MED ORDER — CARBAMAZEPINE 200 MG PO TABS: 200.0000 mg | ORAL_TABLET | Freq: Two times a day (BID) | ORAL | Status: DC

## 2014-02-27 MED ORDER — CARVEDILOL 25 MG PO TABS: 25.0000 mg | ORAL_TABLET | Freq: Two times a day (BID) | ORAL | Status: DC

## 2014-02-27 MED ORDER — AMLODIPINE BESYLATE 2.5 MG PO TABS: 2.5000 mg | ORAL_TABLET | Freq: Every day | ORAL | Status: DC

## 2014-02-27 MED ORDER — TRAZODONE HCL 50 MG PO TABS: 25.0000 mg | ORAL_TABLET | Freq: Every evening | ORAL | Status: DC | PRN

## 2014-02-27 MED ORDER — FENOFIBRATE 54 MG PO TABS: 54.0000 mg | ORAL_TABLET | Freq: Every day | ORAL | Status: DC

## 2014-02-27 MED ORDER — ALPRAZOLAM 1 MG PO TABS: ORAL_TABLET | ORAL | Status: DC

## 2014-02-27 NOTE — Progress Notes (Signed)
Subjective:    Patient ID: Phillip [name], male    DOB: [birth year], 69 y.o.   MRN: [id number]  Flank Pain    Here for wellness and f/u;  Overall doing ok;  Pt denies CP, worsening SOB, DOE, wheezing, orthopnea, PND, worsening LE edema, palpitations, dizziness or syncope.  Pt denies neurological change such as new headache, facial or extremity weakness.  Pt denies polydipsia, polyuria, or low sugar symptoms. Pt states overall good compliance with treatment and medications, good tolerability, and has been trying to follow lower cholesterol diet.  Pt denies worsening depressive symptoms, suicidal ideation or panic. No fever, night sweats, wt loss, loss of appetite, or other constitutional symptoms.  Pt states good ability with ADL's, has low fall risk, home safety reviewed and adequate, no other significant changes in hearing or vision, and only occasionally active with exercise.  DId have some left groin strain after putting in a new mailbox yesterday, as well as left elbow pain without swelling or other trauma.  Changed from MTX due to hair loss per rheum. Thinks ambien cuases nightmares, also with ongoing prloblems sleeping.  Quit smoking x 10 yrs  - prior worked at [employer], smoked 1 ppd for 2-3 yrs only. Declines tetanus and pneumovax/prevnar today.     Very stressed today, mentions occas depression, mentions financial stressors several times, no SI, declines other tx except for xanax and sleep pill, declines counseling.  Past Medical History  Diagnosis Date  . Arthritis   . Hypertension   . Hyperlipidemia   . Anxiety   . Depression   . CAD, multiple vessel   . History of colon polyps   . Insomnia   . Bipolar depression   . GERD (gastroesophageal reflux disease)   . Chronic pain disorder     chronic bilat shoulder pain and feet pain  . Stented coronary artery 01/16/2013    X 2  . Iron deficiency anemia 01/16/2013  . Unspecified vitamin D deficiency 01/17/2013  . Erectile  dysfunction 01/17/2013  . Mixed hyperlipidemia 03/08/2013   Past Surgical History  Procedure Laterality Date  . Eye surgery    . Coronary angioplasty with stent placement  about 2014    current Cardiology: Dr Orene Desanctis    reports that he quit smoking about 12 years ago. His smoking use included Cigarettes. He has a 20 pack-year smoking history. His smokeless tobacco use includes Chew. He reports that he drinks alcohol. He reports that he does not use illicit drugs. family history includes Cancer in his mother; Heart disease in his maternal grandmother and mother; Hyperlipidemia in his maternal grandmother; Stroke in his maternal grandmother. Allergies  Allergen Reactions  . Lipitor [Atorvastatin] Other (See Comments)    myalgia   Current Outpatient Prescriptions on File Prior to Visit  Medication Sig Dispense Refill  . ALPRAZolam (XANAX) 1 MG tablet TAKE 1/2 TO 1 TABLET BY MOUTH TWICE DAILY AS NEEDED 60 tablet 1  . aspirin 81 MG tablet Take 81 mg by mouth daily.    . carbamazepine (TEGRETOL) 200 MG tablet Take 1 tablet (200 mg total) by mouth 2 (two) times daily. 180 tablet 3  . clobetasol cream (TEMOVATE) 9.56 % Apply 1 application topically 2 (two) times daily. As needed 60 g 1  . clopidogrel (PLAVIX) 75 MG tablet TAKE 1 TABLET BY MOUTH DAILY 90 tablet 1  . Cyanocobalamin (VITAMIN B 12 PO) Take by mouth.    . folic acid (FOLVITE) 1 MG tablet TAKE 1  TABLET BY MOUTH DAILY 90 tablet 0  . HYDROcodone-acetaminophen (NORCO/VICODIN) 5-325 MG per tablet Take 1 tablet by mouth 4 (four) times daily as needed. To fill Mar 16, 2013 120 tablet 0  . hydrOXYzine (ATARAX/VISTARIL) 25 MG tablet Take 1 tablet (25 mg total) by mouth 3 (three) times daily as needed. 60 tablet 1  . leflunomide (ARAVA) 20 MG tablet Take 1 tablet by mouth daily.    . Omega-3 Fatty Acids (FISH OIL PO) Take by mouth daily.    . predniSONE (DELTASONE) 5 MG tablet Take 5 mg by mouth daily with breakfast. Take one tablet daily      . sildenafil (REVATIO) 20 MG tablet Take 1 tablet (20 mg total) by mouth 2 (two) times daily as needed. 60 tablet 2  . zolpidem (AMBIEN) 10 MG tablet TAKE 1 TABLET BY MOUTH EVERY NIGHT AT BEDTIME AS NEEDED FOR SLEEP 30 tablet 5   No current facility-administered medications on file prior to visit.    Review of Systems  Genitourinary: Positive for flank pain.  pt does not have flank pain - computer will not let me correct Constitutional: Negative for increased diaphoresis, other activity, appetite or other siginficant weight change  HENT: Negative for worsening hearing loss, ear pain, facial swelling, mouth sores and neck stiffness.   Eyes: Negative for other worsening pain, redness or visual disturbance.  Respiratory: Negative for shortness of breath and wheezing.   Cardiovascular: Negative for chest pain and palpitations.  Gastrointestinal: Negative for diarrhea, blood in stool, abdominal distention or other pain Genitourinary: Negative for hematuria, flank pain or change in urine volume.  Musculoskeletal: Negative for myalgias or other joint complaints.  Skin: Negative for color change and wound.  Neurological: Negative for syncope and numbness. other than noted Hematological: Negative for adenopathy. or other swelling Psychiatric/Behavioral: Negative for hallucinations, self-injury, decreased concentration or other worsening agitation.      Objective:   Physical Exam BP 122/68 mmHg  Pulse 76  Temp(Src) 98 F (36.7 C) (Oral)  Ht 5\' 10"  (1.778 m)  Wt 161 lb (73.029 kg)  BMI 23.10 kg/m2  SpO2 97% VS noted,  Constitutional: Pt is oriented to person, place, and time. Appears well-developed and well-nourished.  Head: Normocephalic and atraumatic.  Right Ear: External ear normal.  Left Ear: External ear normal.  Nose: Nose normal.  Mouth/Throat: Oropharynx is clear and moist.  Eyes: Conjunctivae and EOM are normal. Pupils are equal, round, and reactive to light.  Neck: Normal  range of motion. Neck supple. No JVD present. No tracheal deviation present.  Cardiovascular: Normal rate, regular rhythm, normal heart sounds and intact distal pulses.   Pulmonary/Chest: Effort normal and breath sounds without rales or wheezing  Abdominal: Soft. Bowel sounds are normal. NT. No HSM  Musculoskeletal: Normal range of motion. Exhibits no edema.  Lymphadenopathy:  Has no cervical adenopathy.  Neurological: Pt is alert and oriented to person, place, and time. Pt has normal reflexes. No cranial nerve deficit. Motor grossly intact Skin: Skin is warm and dry. No rash noted.  Psychiatric:  Has markedly nerouvs mood and affect. Behavior is normal.     Assessment & Plan:

## 2014-02-27 NOTE — Patient Instructions (Signed)
You had the flu shot today  Please take all new medication as prescribed - the trazodone for sleep  OK to stop the [place] as you have  Please continue all other medications as before, and refills have been done if requested - the xanax  Please have the pharmacy call with any other refills you may need.  Please continue your efforts at being more active, low cholesterol diet, and weight control.  You are otherwise up to date with prevention measures today.  Please keep your appointments with your specialists as you may have planned  Please go to the LAB in the Basement (turn left off the elevator) for the tests to be done today  You will be contacted by phone if any changes need to be made immediately.  Otherwise, you will receive a letter about your results with an explanation, but please check with MyChart first.  Please remember to sign up for MyChart if you have not done so, as this will be important to you in the future with finding out test results, communicating by private email, and scheduling acute appointments online when needed.  Please return in 6 months, or sooner if needed

## 2014-02-27 NOTE — Progress Notes (Signed)
Pre visit review using our clinic review tool, if applicable. No additional management support is needed unless otherwise documented below in the visit note. 

## 2014-02-27 NOTE — Assessment & Plan Note (Signed)

## 2014-02-28 ENCOUNTER — Other Ambulatory Visit (INDEPENDENT_AMBULATORY_CARE_PROVIDER_SITE_OTHER): Payer: Medicare Other

## 2014-02-28 ENCOUNTER — Other Ambulatory Visit: Payer: Self-pay | Admitting: Internal Medicine

## 2014-02-28 ENCOUNTER — Encounter: Payer: Self-pay | Admitting: Internal Medicine

## 2014-02-28 DIAGNOSIS — R7309 Other abnormal glucose: Secondary | ICD-10-CM

## 2014-02-28 DIAGNOSIS — R739 Hyperglycemia, unspecified: Secondary | ICD-10-CM

## 2014-02-28 LAB — HEMOGLOBIN A1C: Hgb A1c MFr Bld: 6.2 % (ref 4.6–6.5)

## 2014-02-28 MED ORDER — ROSUVASTATIN CALCIUM 20 MG PO TABS: 20.0000 mg | ORAL_TABLET | Freq: Every day | ORAL | Status: DC

## 2014-03-01 ENCOUNTER — Telehealth: Payer: Self-pay | Admitting: Internal Medicine

## 2014-03-01 MED ORDER — SIMVASTATIN 40 MG PO TABS: 40.0000 mg | ORAL_TABLET | Freq: Every day | ORAL | Status: DC

## 2014-03-01 NOTE — Telephone Encounter (Signed)
-----   Message from [place] sent at 03/01/2014  8:10 AM EST ----- Patient informed of results and change in medication... The patient stated crestor is too expensive.. Please offer alternative.

## 2014-03-01 NOTE — Telephone Encounter (Signed)
Ok for change to simvastatin, but may not work as well - done erx

## 2014-03-01 NOTE — Telephone Encounter (Signed)
Patient informed.  The patient at this time decided to just schedule appt. On Wednesday 03/06/14 at 10 am to discuss cholesterol medication as well as recent rash.

## 2014-03-05 ENCOUNTER — Other Ambulatory Visit: Payer: Self-pay | Admitting: Internal Medicine

## 2014-03-06 ENCOUNTER — Ambulatory Visit (INDEPENDENT_AMBULATORY_CARE_PROVIDER_SITE_OTHER): Payer: Medicare Other | Admitting: Internal Medicine

## 2014-03-06 ENCOUNTER — Encounter: Payer: Self-pay | Admitting: Internal Medicine

## 2014-03-06 VITALS — BP 146/80 | HR 71 | Temp 98.1°F | Ht 70.0 in | Wt 159.0 lb

## 2014-03-06 DIAGNOSIS — I10 Essential (primary) hypertension: Secondary | ICD-10-CM

## 2014-03-06 DIAGNOSIS — R739 Hyperglycemia, unspecified: Secondary | ICD-10-CM

## 2014-03-06 DIAGNOSIS — R21 Rash and other nonspecific skin eruption: Secondary | ICD-10-CM

## 2014-03-06 DIAGNOSIS — E785 Hyperlipidemia, unspecified: Secondary | ICD-10-CM

## 2014-03-06 DIAGNOSIS — R7309 Other abnormal glucose: Secondary | ICD-10-CM

## 2014-03-06 MED ORDER — HYDROXYZINE HCL 25 MG PO TABS
25.0000 mg | ORAL_TABLET | Freq: Three times a day (TID) | ORAL | Status: DC | PRN
Start: 2014-03-06 — End: 2014-11-25

## 2014-03-06 MED ORDER — PREDNISONE 10 MG PO TABS: ORAL_TABLET | ORAL | Status: DC

## 2014-03-06 NOTE — Progress Notes (Signed)
Subjective:    Patient ID: Phillip [name], male    DOB: 1945/06/27, 69 y.o.   MRN: [id number]  HPI  Here with > 1 month allover body intermittent itching with intermittent rash, red spots that seem to come and go to the torso and extremities, not assoc with swelling, fever, sob or wheezing.  Has not tried any OTC antihist. Nothing seems to make better or worse  Pt denies chest pain, increased sob or doe, wheezing, orthopnea, PND, increased LE swelling, palpitations, dizziness or syncope.   Pt denies polydipsia, polyuria,  Has not been taking zocor recently, but not clear why Past Medical History  Diagnosis Date  . Arthritis   . Hypertension   . Hyperlipidemia   . Anxiety   . Depression   . CAD, multiple vessel   . History of colon polyps   . Insomnia   . Bipolar depression   . GERD (gastroesophageal reflux disease)   . Chronic pain disorder     chronic bilat shoulder pain and feet pain  . Stented coronary artery 01/16/2013    X 2  . Iron deficiency anemia 01/16/2013  . Unspecified vitamin D deficiency 01/17/2013  . Erectile dysfunction 01/17/2013  . Mixed hyperlipidemia 03/08/2013   Past Surgical History  Procedure Laterality Date  . Eye surgery    . Coronary angioplasty with stent placement  about 2014    current Cardiology: Dr Orene Desanctis    reports that he quit smoking about 12 years ago. His smoking use included Cigarettes. He has a 20 pack-year smoking history. His smokeless tobacco use includes Chew. He reports that he drinks alcohol. He reports that he does not use illicit drugs. family history includes Cancer in his mother; Heart disease in his maternal grandmother and mother; Hyperlipidemia in his maternal grandmother; Stroke in his maternal grandmother. Allergies  Allergen Reactions  . Lipitor [Atorvastatin] Other (See Comments)    myalgia   Current Outpatient Prescriptions on File Prior to Visit  Medication Sig Dispense Refill  . ALPRAZolam (XANAX) 1 MG tablet TAKE  1/2 TO 1 TABLET BY MOUTH TWICE DAILY AS NEEDED 60 tablet 1  . amLODipine (NORVASC) 2.5 MG tablet Take 1 tablet (2.5 mg total) by mouth daily. 90 tablet 3  . aspirin 81 MG tablet Take 81 mg by mouth daily.    . carbamazepine (TEGRETOL) 200 MG tablet Take 1 tablet (200 mg total) by mouth 2 (two) times daily. 180 tablet 3  . carvedilol (COREG) 25 MG tablet Take 1 tablet (25 mg total) by mouth 2 (two) times daily with a meal. 180 tablet 3  . clobetasol cream (TEMOVATE) 1.88 % Apply 1 application topically 2 (two) times daily. As needed 60 g 1  . clopidogrel (PLAVIX) 75 MG tablet TAKE 1 TABLET BY MOUTH DAILY 90 tablet 1  . Cyanocobalamin (VITAMIN B 12 PO) Take by mouth.    . fenofibrate 54 MG tablet Take 1 tablet (54 mg total) by mouth daily. 90 tablet 3  . folic acid (FOLVITE) 1 MG tablet TAKE 1 TABLET BY MOUTH DAILY 90 tablet 0  . HYDROcodone-acetaminophen (NORCO/VICODIN) 5-325 MG per tablet Take 1 tablet by mouth 4 (four) times daily as needed. To fill Mar 16, 2013 120 tablet 0  . leflunomide (ARAVA) 20 MG tablet Take 1 tablet by mouth daily.    . Omega-3 Fatty Acids (FISH OIL PO) Take by mouth daily.    . pantoprazole (PROTONIX) 40 MG tablet Take 1 tablet (40 mg total) by  mouth 2 (two) times daily. 180 tablet 3  . predniSONE (DELTASONE) 5 MG tablet Take 5 mg by mouth daily with breakfast. Take one tablet daily    . ramipril (ALTACE) 2.5 MG capsule Take 1 capsule (2.5 mg total) by mouth daily. Take 2.5 mg by mouth daily. 90 capsule 3  . sildenafil (REVATIO) 20 MG tablet Take 1 tablet (20 mg total) by mouth 2 (two) times daily as needed. 60 tablet 2  . simvastatin (ZOCOR) 40 MG tablet Take 1 tablet (40 mg total) by mouth at bedtime. 90 tablet 3  . traZODone (DESYREL) 50 MG tablet Take 0.5-1 tablets (25-50 mg total) by mouth at bedtime as needed for sleep. 90 tablet 1   No current facility-administered medications on file prior to visit.   Review of Systems  Constitutional: Negative for unusual  diaphoresis or other sweats  HENT: Negative for ringing in ear Eyes: Negative for double vision or worsening visual disturbance.  Respiratory: Negative for choking and stridor.   Gastrointestinal: Negative for vomiting or other signifcant bowel change Genitourinary: Negative for hematuria or decreased urine volume.  Musculoskeletal: Negative for other MSK pain or swelling Skin: Negative for color change and worsening wound.  Neurological: Negative for tremors and numbness other than noted  Psychiatric/Behavioral: Negative for decreased concentration or agitation other than above       Objective:   Physical Exam BP 146/80 mmHg  Pulse 71  Temp(Src) 98.1 F (36.7 C) (Oral)  Ht 5\' 10"  (1.778 m)  Wt 159 lb (72.122 kg)  BMI 22.81 kg/m2 VS noted, not ill appearing Constitutional: Pt appears well-developed, well-nourished.  HENT: Head: NCAT.  Right Ear: External ear normal.  Left Ear: External ear normal.  Eyes: . Pupils are equal, round, and reactive to light. Conjunctivae and EOM are normal Neck: Normal range of motion. Neck supple.  Cardiovascular: Normal rate and regular rhythm.   Pulmonary/Chest: Effort normal and breath sounds without rales or wheezing.  Neurological: Pt is alert. Not confused , motor grossly intact Skin: Skin is warm. + rash with several wheal and flare to torso and arms Psychiatric: Pt behavior is normal. No agitation.     Assessment & Plan:

## 2014-03-06 NOTE — Progress Notes (Signed)
Pre visit review using our clinic review tool, if applicable. No additional management support is needed unless otherwise documented below in the visit note. 

## 2014-03-06 NOTE — Patient Instructions (Signed)
Ok to start the simvastatin for cholesterol  Please return in 4-6 weeks for a blood test to re-check  Please take all new medication as prescribed - the prednisone, and atarax refill  You can cont the clobetasol cream as needed, but please consider Benadryl cream OTC as well, since this is safer in the long run for the skin  Please continue all other medications as before  Please have the pharmacy call with any other refills you may need.  Please continue your efforts at being more active, low cholesterol diet, and weight control.  Please keep your appointments with your specialists as you may have planned

## 2014-03-14 NOTE — Assessment & Plan Note (Signed)
C/w likely allergic rash such as hives; without complication but recurrent persistent; ok to try otc benadryl 50 q 6 prn, also given atarax and predpac asd, consider derm or allergy referral - declines for now,  to f/u any worsening symptoms or concerns

## 2014-03-14 NOTE — Assessment & Plan Note (Signed)
stable overall by history and exam, recent data reviewed with pt, and pt to continue medical treatment as before,  to f/u any worsening symptoms or concerns Lab Results  Component Value Date   HGBA1C 6.2 02/28/2014   Pt to call for onset polys or cbg > 200 with steroid tx

## 2014-03-14 NOTE — Assessment & Plan Note (Addendum)
Lab Results  Component Value Date   LDLCALC 93 10/09/2013   stable overall by history and exam, recent data reviewed with pt, and pt to continue medical treatment as before - re-start the zocor,  to f/u any worsening symptoms or concerns, for f/u labs at 6 wks

## 2014-03-14 NOTE — Assessment & Plan Note (Signed)
stable overall by history and exam, recent data reviewed with pt, and pt to continue medical treatment as before,  to f/u any worsening symptoms or concerns BP Readings from Last 3 Encounters:  03/06/14 146/80  02/27/14 122/68  12/17/13 144/88

## 2014-03-22 ENCOUNTER — Other Ambulatory Visit: Payer: Self-pay | Admitting: Internal Medicine

## 2014-04-19 ENCOUNTER — Other Ambulatory Visit: Payer: Self-pay | Admitting: Internal Medicine

## 2014-04-19 NOTE — Telephone Encounter (Signed)
Done hardcopy to [facility]  

## 2014-04-19 NOTE — Telephone Encounter (Signed)
RX sent and done.

## 2014-05-03 ENCOUNTER — Telehealth: Payer: Self-pay | Admitting: Internal Medicine

## 2014-05-03 NOTE — Telephone Encounter (Signed)
Pt called in said that ins compy needs a PA on his hydrOXYzine (ATARAX/VISTARIL) 25 MG tablet

## 2014-05-03 NOTE — Telephone Encounter (Signed)
Will do the PA once I receive it.

## 2014-06-20 ENCOUNTER — Other Ambulatory Visit: Payer: Self-pay | Admitting: Internal Medicine

## 2014-06-20 NOTE — Telephone Encounter (Signed)
Done hardcopy to [facility]  

## 2014-06-20 NOTE — Telephone Encounter (Signed)
Rx sent to pharmacy   

## 2014-08-01 ENCOUNTER — Encounter: Payer: Self-pay | Admitting: *Deleted

## 2014-08-01 ENCOUNTER — Other Ambulatory Visit: Payer: Self-pay | Admitting: *Deleted

## 2014-08-20 ENCOUNTER — Other Ambulatory Visit: Payer: Self-pay | Admitting: Internal Medicine

## 2014-08-21 ENCOUNTER — Other Ambulatory Visit: Payer: Self-pay

## 2014-08-21 MED ORDER — CLOPIDOGREL BISULFATE 75 MG PO TABS: 75.0000 mg | ORAL_TABLET | Freq: Every day | ORAL | Status: DC

## 2014-08-21 NOTE — Telephone Encounter (Signed)
Rx faxed to pharmacy  

## 2014-08-21 NOTE — Telephone Encounter (Signed)
Trazodone done erx  Xanax Done hardcopy to [facility]

## 2014-09-19 ENCOUNTER — Ambulatory Visit (INDEPENDENT_AMBULATORY_CARE_PROVIDER_SITE_OTHER): Payer: Medicare Other | Admitting: Internal Medicine

## 2014-09-19 ENCOUNTER — Encounter: Payer: Self-pay | Admitting: Internal Medicine

## 2014-09-19 VITALS — BP 136/70 | HR 84 | Temp 98.1°F | Ht 70.0 in | Wt 160.0 lb

## 2014-09-19 DIAGNOSIS — I10 Essential (primary) hypertension: Secondary | ICD-10-CM | POA: Diagnosis not present

## 2014-09-19 DIAGNOSIS — R7309 Other abnormal glucose: Secondary | ICD-10-CM | POA: Diagnosis not present

## 2014-09-19 DIAGNOSIS — L02419 Cutaneous abscess of limb, unspecified: Secondary | ICD-10-CM | POA: Diagnosis not present

## 2014-09-19 DIAGNOSIS — R739 Hyperglycemia, unspecified: Secondary | ICD-10-CM

## 2014-09-19 DIAGNOSIS — L0291 Cutaneous abscess, unspecified: Secondary | ICD-10-CM | POA: Insufficient documentation

## 2014-09-19 MED ORDER — DOXYCYCLINE HYCLATE 100 MG PO TABS: 100.0000 mg | ORAL_TABLET | Freq: Two times a day (BID) | ORAL | Status: DC

## 2014-09-19 NOTE — Progress Notes (Signed)
Pre visit review using our clinic review tool, if applicable. No additional management support is needed unless otherwise documented below in the visit note. 

## 2014-09-19 NOTE — Assessment & Plan Note (Signed)
stable overall by history and exam, recent data reviewed with pt, and pt to continue medical treatment as before,  to f/u any worsening symptoms or concerns Lab Results  Component Value Date   HGBA1C 6.2 02/28/2014

## 2014-09-19 NOTE — Patient Instructions (Signed)
Please take all new medication as prescribed  Please continue all other medications as before, and refills have been done if requested.  Please have the pharmacy call with any other refills you may need.  Please continue your efforts at being more active, low cholesterol diet, and weight control.  Please keep your appointments with your specialists as you may have planned    

## 2014-09-19 NOTE — Assessment & Plan Note (Signed)
Small , mild , has spont drained, pt educated, for topical neosporin asd, for antibx course,  to f/u any worsening symptoms or concerns

## 2014-09-19 NOTE — Progress Notes (Signed)
Subjective:    Patient ID: Phillip [name], male    DOB: 01-Jan-1946, 69 y.o.   MRN: [id number]  HPI  Here to f/u with c/o 2-3 days onset right medial thigh most proximal aspect 1 -2 cm red/tender/sweling with small pustular drainage, without fever or other red streaks.  Pt denies chest pain, increased sob or doe, wheezing, orthopnea, PND, increased LE swelling, palpitations, dizziness or syncope.   Pt denies polydipsia, polyuria Pt denies new neurological symptoms such as new headache, or facial or extremity weakness or numbness Past Medical History  Diagnosis Date  . Arthritis   . Hypertension   . Hyperlipidemia   . Anxiety   . Depression   . CAD, multiple vessel   . History of colon polyps   . Insomnia   . Bipolar depression   . GERD (gastroesophageal reflux disease)   . Chronic pain disorder     chronic bilat shoulder pain and feet pain  . Stented coronary artery 01/16/2013    X 2  . Iron deficiency anemia 01/16/2013  . Unspecified vitamin D deficiency 01/17/2013  . Erectile dysfunction 01/17/2013  . Mixed hyperlipidemia 03/08/2013  . Anemia   . Systolic murmur   . Status post insertion of drug-eluting stent into left anterior descending (LAD) artery for coronary artery disease    Past Surgical History  Procedure Laterality Date  . Eye surgery    . Coronary angioplasty with stent placement  about 2014    current Cardiology: Dr Orene Desanctis  . Carotid stent  2008    reports that he quit smoking about 12 years ago. His smoking use included Cigarettes. He has a 20 pack-year smoking history. His smokeless tobacco use includes Chew. He reports that he drinks alcohol. He reports that he does not use illicit drugs. family history includes Alzheimer's disease in his brother; Cancer in his maternal grandfather, maternal grandmother, and mother; Heart disease in his maternal grandmother, mother, and paternal grandfather; Hyperlipidemia in his maternal grandmother; Lung cancer in his father;  Stroke in his maternal grandmother and paternal grandfather. Allergies  Allergen Reactions  . Lipitor [Atorvastatin] Other (See Comments)    myalgia   Current Outpatient Prescriptions on File Prior to Visit  Medication Sig Dispense Refill  . ALPRAZolam (XANAX) 1 MG tablet TAKE 1/2-1 TABLET BY MOUTH TWICE DAILY AS NEEDED 60 tablet 2  . amLODipine (NORVASC) 2.5 MG tablet Take 1 tablet (2.5 mg total) by mouth daily. 90 tablet 3  . aspirin 81 MG tablet Take 81 mg by mouth daily.    . carbamazepine (TEGRETOL) 200 MG tablet Take 1 tablet (200 mg total) by mouth 2 (two) times daily. 180 tablet 3  . carvedilol (COREG) 25 MG tablet Take 1 tablet (25 mg total) by mouth 2 (two) times daily with a meal. 180 tablet 3  . clobetasol cream (TEMOVATE) 0.05 % APPLY TOPICALLY TWICE DAILY AS NEEDED 60 g 1  . clopidogrel (PLAVIX) 75 MG tablet Take 1 tablet (75 mg total) by mouth daily. 90 tablet 1  . Cyanocobalamin (VITAMIN B 12 PO) Take by mouth.    . fenofibrate 54 MG tablet Take 1 tablet (54 mg total) by mouth daily. 90 tablet 3  . folic acid (FOLVITE) 1 MG tablet TAKE 1 TABLET BY MOUTH DAILY 90 tablet 0  . HYDROcodone-acetaminophen (NORCO/VICODIN) 5-325 MG per tablet Take 1 tablet by mouth 4 (four) times daily as needed. To fill Mar 16, 2013 120 tablet 0  . hydrOXYzine (ATARAX/VISTARIL) 25 MG  tablet Take 1 tablet (25 mg total) by mouth 3 (three) times daily as needed. 60 tablet 3  . leflunomide (ARAVA) 20 MG tablet Take 1 tablet by mouth daily.    . Omega-3 Fatty Acids (FISH OIL PO) Take by mouth daily.    . pantoprazole (PROTONIX) 40 MG tablet Take 1 tablet (40 mg total) by mouth 2 (two) times daily. 180 tablet 3  . predniSONE (DELTASONE) 5 MG tablet Take 5 mg by mouth daily with breakfast. Take one tablet daily    . ramipril (ALTACE) 2.5 MG capsule Take 1 capsule (2.5 mg total) by mouth daily. Take 2.5 mg by mouth daily. 90 capsule 3  . sildenafil (REVATIO) 20 MG tablet Take 1 tablet (20 mg total) by mouth  2 (two) times daily as needed. 60 tablet 2  . simvastatin (ZOCOR) 40 MG tablet Take 1 tablet (40 mg total) by mouth at bedtime. 90 tablet 3  . traZODone (DESYREL) 50 MG tablet TAKE 1/2 TO 1 TABLETS BY MOUTH AT BEDTIME AS NEEDED FOR SLEEP 90 tablet 5  . predniSONE (DELTASONE) 10 MG tablet 3 tabs by mouth per day for 3 days,2tabs per day for 3 days,1tab per day for 3 days (Patient not taking: Reported on 09/19/2014) 18 tablet 0   No current facility-administered medications on file prior to visit.   Review of Systems     Objective:   Physical Exam BP 136/70 mmHg  Pulse 84  Temp(Src) 98.1 F (36.7 C) (Oral)  Ht 5\' 10"  (1.778 m)  Wt 160 lb (72.576 kg)  BMI 22.96 kg/m2  SpO2 97% VS noted,  Constitutional: Pt appears in no significant distress HENT: Head: NCAT.  Right Ear: External ear normal.  Left Ear: External ear normal.  Eyes: . Pupils are equal, round, and reactive to light. Conjunctivae and EOM are normal Neck: Normal range of motion. Neck supple.  Cardiovascular: Normal rate and regular rhythm.   Pulmonary/Chest: Effort normal and breath sounds without rales or wheezing.  Abd:  Soft, NT, ND, + BS Neurological: Pt is alert. Not confused , motor grossly intact Skin: Skin is warm. No rash, no LE edema but small 1-2 cm red/tender/swelling erythema with small drainage right medial prox thigh Psychiatric: Pt behavior is normal. No agitation.     Assessment & Plan:

## 2014-09-19 NOTE — Assessment & Plan Note (Signed)
stable overall by history and exam, recent data reviewed with pt, and pt to continue medical treatment as before,  to f/u any worsening symptoms or concerns BP Readings from Last 3 Encounters:  09/19/14 136/70  03/06/14 146/80  02/27/14 122/68

## 2014-10-08 ENCOUNTER — Telehealth: Payer: Self-pay | Admitting: Internal Medicine

## 2014-10-08 MED ORDER — AMLODIPINE BESYLATE 2.5 MG PO TABS: 2.5000 mg | ORAL_TABLET | Freq: Every day | ORAL | Status: DC

## 2014-10-08 NOTE — Telephone Encounter (Signed)
Pt called and needs refill on   amLODipine (NORVASC) 2.5 MG tablet [[id number]]        Best number [phone]

## 2014-11-25 ENCOUNTER — Other Ambulatory Visit: Payer: Self-pay | Admitting: Internal Medicine

## 2014-11-25 MED ORDER — ALPRAZOLAM 1 MG PO TABS: ORAL_TABLET | ORAL | Status: DC

## 2014-11-25 NOTE — Telephone Encounter (Signed)
Called pharmacy spoke with wayne/pharmacist gave md approval.../lmb

## 2014-11-25 NOTE — Telephone Encounter (Signed)
X1 No refills

## 2014-11-25 NOTE — Telephone Encounter (Signed)
MD out of office pls advise on refills...Phillip [name]

## 2014-11-26 ENCOUNTER — Telehealth: Payer: Self-pay | Admitting: Internal Medicine

## 2014-11-26 NOTE — Telephone Encounter (Signed)
Patient Name: Phillip [name]  DOB: 17-Mar-1945    Initial Comment Caller States cut on arm and won't stop bleeding for a couple days    Nurse Assessment  Nurse: Thad Ranger, RN, Denise Date/Time (Eastern Time): 11/26/2014 4:17:25 PM  Confirm and document reason for call. If symptomatic, describe symptoms. ---Caller states cut his arm x 2 sites on a pallet at [place] 2 days ago and states the cut is small but won't stop bleeding for a couple days. Styptic is being held on his arm now to attempt to stop the bleeding. Takes anticoagulants.  Has the patient traveled out of the country within the last 30 days? ---Not Applicable  Does the patient have any new or worsening symptoms? ---Yes  Will a triage be completed? ---Yes  Related visit to physician within the last 2 weeks? ---No  Does the PT have any chronic conditions? (i.e. diabetes, asthma, etc.) ---Yes  List chronic conditions. ---Cardiac stents x 2, anticoag tx     Guidelines    Guideline Title Affirmed Question Affirmed Notes  Cuts and Lacerations [1] Last tetanus shot > 5 years ago AND [2] DIRTY cut Last tetanus vaccine is unknown.   Final Disposition User   See PCP When Office is Open (within 3 days) Carmon, RN, Langley Gauss    Comments  Pt has not been holding direct pressure and the sites are small and not profusely bleeding.

## 2014-12-24 ENCOUNTER — Other Ambulatory Visit: Payer: Self-pay | Admitting: Internal Medicine

## 2014-12-25 NOTE — Telephone Encounter (Signed)
Done hardcopy to Dahlia  

## 2014-12-25 NOTE — Telephone Encounter (Signed)
Rx faxed to pharmacy  

## 2014-12-25 NOTE — Telephone Encounter (Signed)
Please advise 

## 2014-12-27 ENCOUNTER — Other Ambulatory Visit: Payer: Self-pay | Admitting: Emergency Medicine

## 2014-12-27 MED ORDER — CLOPIDOGREL BISULFATE 75 MG PO TABS: 75.0000 mg | ORAL_TABLET | Freq: Every day | ORAL | Status: DC

## 2015-01-08 ENCOUNTER — Telehealth: Payer: Self-pay

## 2015-01-08 NOTE — Telephone Encounter (Signed)
Patient called to educate on Medicare Wellness apt. LVM for the patient to call back to educate and schedule for wellness visit.  To call the office or my line directly for more information

## 2015-01-20 NOTE — Telephone Encounter (Signed)
fup for AWV; LVM to call and schedule; routine preventive exam generally scheduled in January.

## 2015-01-21 ENCOUNTER — Telehealth: Payer: Self-pay

## 2015-01-21 NOTE — Telephone Encounter (Signed)
Mr. Markiewicz returned call for AWV, but CPE is this week/ LVM: Will see Dr. Jenny Reichmann as planned and Dr. Jenny Reichmann can completed AWV or refer to come back in.

## 2015-01-24 ENCOUNTER — Encounter: Payer: Self-pay | Admitting: Internal Medicine

## 2015-01-24 ENCOUNTER — Ambulatory Visit (INDEPENDENT_AMBULATORY_CARE_PROVIDER_SITE_OTHER): Payer: Medicare Other | Admitting: Internal Medicine

## 2015-01-24 ENCOUNTER — Other Ambulatory Visit: Payer: Self-pay | Admitting: Internal Medicine

## 2015-01-24 VITALS — BP 154/76 | HR 69 | Temp 97.8°F | Ht 70.0 in | Wt 161.0 lb

## 2015-01-24 DIAGNOSIS — Z0000 Encounter for general adult medical examination without abnormal findings: Secondary | ICD-10-CM

## 2015-01-24 DIAGNOSIS — F329 Major depressive disorder, single episode, unspecified: Secondary | ICD-10-CM

## 2015-01-24 DIAGNOSIS — Z800 Family history of malignant neoplasm of digestive organs: Secondary | ICD-10-CM

## 2015-01-24 DIAGNOSIS — R7309 Other abnormal glucose: Secondary | ICD-10-CM | POA: Diagnosis not present

## 2015-01-24 DIAGNOSIS — F32A Depression, unspecified: Secondary | ICD-10-CM

## 2015-01-24 DIAGNOSIS — Z23 Encounter for immunization: Secondary | ICD-10-CM

## 2015-01-24 DIAGNOSIS — I10 Essential (primary) hypertension: Secondary | ICD-10-CM | POA: Diagnosis not present

## 2015-01-24 DIAGNOSIS — R739 Hyperglycemia, unspecified: Secondary | ICD-10-CM

## 2015-01-24 DIAGNOSIS — E785 Hyperlipidemia, unspecified: Secondary | ICD-10-CM

## 2015-01-24 DIAGNOSIS — N529 Male erectile dysfunction, unspecified: Secondary | ICD-10-CM

## 2015-01-24 MED ORDER — SILDENAFIL CITRATE 20 MG PO TABS: ORAL_TABLET | ORAL | Status: DC

## 2015-01-24 MED ORDER — TRAZODONE HCL 50 MG PO TABS: ORAL_TABLET | ORAL | Status: DC

## 2015-01-24 NOTE — Assessment & Plan Note (Signed)
stable overall by history and exam, recent data reviewed with pt, and pt to continue medical treatment as before,  to f/u any worsening symptoms or concerns Lab Results  Component Value Date   LDLCALC 93 10/09/2013

## 2015-01-24 NOTE — Patient Instructions (Addendum)
Your EKG was Ok today  Please continue all other medications as before, and refills have been done if requested - the trazodone, and viagra  Please have the pharmacy call with any other refills you may need.  Please continue your efforts at being more active, low cholesterol diet, and weight control.  Please keep your appointments with your specialists as you may have planned  You will be contacted regarding the referral for: colonoscopy due to your brothers history  Please return in 6 months, or sooner if needed, with Lab testing done 3-5 days before

## 2015-01-24 NOTE — Assessment & Plan Note (Signed)
stable overall by history and exam, recent data reviewed with pt, and pt to continue medical treatment as before,  to f/u any worsening symptoms or concerns BP Readings from Last 3 Encounters:  01/24/15 154/76  09/19/14 136/70  03/06/14 146/80   ECG reviewed as per emr

## 2015-01-24 NOTE — Assessment & Plan Note (Signed)
stable overall by history and exam, recent data reviewed with pt, and pt to continue medical treatment as before,  to f/u any worsening symptoms or concerns Lab Results  Component Value Date   WBC 6.7 02/27/2014   HGB 11.4* 02/27/2014   HCT 35.0* 02/27/2014   PLT 373.0 02/27/2014   GLUCOSE 173* 02/27/2014   CHOL 223* 02/27/2014   TRIG 206.0* 02/27/2014   HDL 40.60 02/27/2014   LDLDIRECT 142.5 02/27/2014   LDLCALC 93 10/09/2013   ALT 16 02/27/2014   AST 20 02/27/2014   NA 130* 02/27/2014   K 4.0 02/27/2014   CL 96 02/27/2014   CREATININE 0.7 02/27/2014   BUN 8 02/27/2014   CO2 26 02/27/2014   TSH 1.90 02/27/2014   PSA 0.60 02/27/2014   INR 1.0 09/15/2008   HGBA1C 6.2 02/28/2014

## 2015-01-24 NOTE — Progress Notes (Signed)
Subjective:    Patient ID: Phillip [name], male    DOB: 15-Jun-1945, 69 y.o.   MRN: [id number]  HPI  Here to f/u; overall doing ok,  Pt denies chest pain, increasing sob or doe, wheezing, orthopnea, PND, increased LE swelling, palpitations, dizziness or syncope.  Pt denies new neurological symptoms such as new headache, or facial or extremity weakness or numbness.  Pt denies polydipsia, polyuria, or low sugar episode.   Pt denies new neurological symptoms such as new headache, or facial or extremity weakness or numbness.   Pt states overall good compliance with meds, mostly trying to follow appropriate diet, with wt overall stable,  but little exercise however.   Due flu shot, delcines prevnar and tetanus for now.  Needs viagra refill  Younger Brother with colon cancer - pt  due for f/u colonoscopy.  No new complaints.  Denies worsening depressive symptoms, suicidal ideation, or panic Past Medical History  Diagnosis Date  . Arthritis   . Hypertension   . Hyperlipidemia   . Anxiety   . Depression   . CAD, multiple vessel   . History of colon polyps   . Insomnia   . Bipolar depression ([place])   . GERD (gastroesophageal reflux disease)   . Chronic pain disorder     chronic bilat shoulder pain and feet pain  . Stented coronary artery 01/16/2013    X 2  . Iron deficiency anemia 01/16/2013  . Unspecified vitamin D deficiency 01/17/2013  . Erectile dysfunction 01/17/2013  . Mixed hyperlipidemia 03/08/2013  . Anemia   . Systolic murmur   . Status post insertion of drug-eluting stent into left anterior descending (LAD) artery for coronary artery disease    Past Surgical History  Procedure Laterality Date  . Eye surgery    . Coronary angioplasty with stent placement  about 2014    current Cardiology: Dr Orene Desanctis  . Carotid stent  2008    reports that he quit smoking about 12 years ago. His smoking use included Cigarettes. He has a 20 pack-year smoking history. His smokeless tobacco use  includes Chew. He reports that he drinks alcohol. He reports that he does not use illicit drugs. family history includes Alzheimer's disease in his brother; Cancer in his maternal grandfather, maternal grandmother, and mother; Heart disease in his maternal grandmother, mother, and paternal grandfather; Hyperlipidemia in his maternal grandmother; Lung cancer in his father; Stroke in his maternal grandmother and paternal grandfather. Allergies  Allergen Reactions  . Lipitor [Atorvastatin] Other (See Comments)    myalgia   Current Outpatient Prescriptions on File Prior to Visit  Medication Sig Dispense Refill  . ALPRAZolam (XANAX) 1 MG tablet TAKE 1/2 TO 1 TABLET BY MOUTH TWICE DAILY AS NEEDED 60 tablet 2  . amLODipine (NORVASC) 2.5 MG tablet Take 1 tablet (2.5 mg total) by mouth daily. 90 tablet 3  . aspirin 81 MG tablet Take 81 mg by mouth daily.    . carbamazepine (TEGRETOL) 200 MG tablet Take 1 tablet (200 mg total) by mouth 2 (two) times daily. 180 tablet 3  . carvedilol (COREG) 25 MG tablet TAKE 1 TABLET BY MOUTH TWICE DAILY WITH A MEAL 180 tablet 1  . clobetasol cream (TEMOVATE) 0.05 % APPLY TOPICALLY TWICE DAILY AS NEEDED 60 g 1  . clopidogrel (PLAVIX) 75 MG tablet Take 1 tablet (75 mg total) by mouth daily. 90 tablet 0  . Cyanocobalamin (VITAMIN B 12 PO) Take by mouth.    . fenofibrate 54 MG  tablet Take 1 tablet (54 mg total) by mouth daily. 90 tablet 3  . folic acid (FOLVITE) 1 MG tablet TAKE 1 TABLET BY MOUTH DAILY 90 tablet 0  . hydrOXYzine (ATARAX/VISTARIL) 25 MG tablet TAKE 1 TABLET BY MOUTH 3 TIMES DAILY AS NEEDED 270 tablet 1  . leflunomide (ARAVA) 20 MG tablet Take 1 tablet by mouth daily.    . Omega-3 Fatty Acids (FISH OIL PO) Take by mouth daily.    . pantoprazole (PROTONIX) 40 MG tablet Take 1 tablet (40 mg total) by mouth 2 (two) times daily. 180 tablet 3  . predniSONE (DELTASONE) 5 MG tablet Take 2.5 mg by mouth daily with breakfast. Take one tablet daily    . ramipril  (ALTACE) 2.5 MG capsule Take 1 capsule (2.5 mg total) by mouth daily. Take 2.5 mg by mouth daily. 90 capsule 3  . simvastatin (ZOCOR) 40 MG tablet TAKE ONE (1) TABLET BY MOUTH AT BEDTIME 90 tablet 0   No current facility-administered medications on file prior to visit.   Review of Systems  Constitutional: Negative for unusual diaphoresis or night sweats HENT: Negative for ringing in ear or discharge Eyes: Negative for double vision or worsening visual disturbance.  Respiratory: Negative for choking and stridor.   Gastrointestinal: Negative for vomiting or other signifcant bowel change Genitourinary: Negative for hematuria or change in urine volume.  Musculoskeletal: Negative for other MSK pain or swelling Skin: Negative for color change and worsening wound.  Neurological: Negative for tremors and numbness other than noted  Psychiatric/Behavioral: Negative for decreased concentration or agitation other than above       Objective:   Physical Exam BP 154/76 mmHg  Pulse 69  Temp(Src) 97.8 F (36.6 C) (Oral)  Ht 5\' 10"  (1.778 m)  Wt 161 lb (73.029 kg)  BMI 23.10 kg/m2  SpO2 98% VS noted,  Constitutional: Pt appears in no significant distress HENT: Head: NCAT.  Right Ear: External ear normal.  Left Ear: External ear normal.  Eyes: . Pupils are equal, round, and reactive to light. Conjunctivae and EOM are normal Neck: Normal range of motion. Neck supple.  Cardiovascular: Normal rate and regular rhythm.   Pulmonary/Chest: Effort normal and breath sounds without rales or wheezing.  Abd:  Soft, NT, ND, + BS Neurological: Pt is alert. Not confused , motor grossly intact Skin: Skin is warm. No rash, no LE edema Psychiatric: Pt behavior is normal. No agitation. Not depressed affect Lab Results  Component Value Date   WBC 6.7 02/27/2014   HGB 11.4* 02/27/2014   HCT 35.0* 02/27/2014   PLT 373.0 02/27/2014   GLUCOSE 173* 02/27/2014   CHOL 223* 02/27/2014   TRIG 206.0* 02/27/2014    HDL 40.60 02/27/2014   LDLDIRECT 142.5 02/27/2014   LDLCALC 93 10/09/2013   ALT 16 02/27/2014   AST 20 02/27/2014   NA 130* 02/27/2014   K 4.0 02/27/2014   CL 96 02/27/2014   CREATININE 0.7 02/27/2014   BUN 8 02/27/2014   CO2 26 02/27/2014   TSH 1.90 02/27/2014   PSA 0.60 02/27/2014   INR 1.0 09/15/2008   HGBA1C 6.2 02/28/2014        Assessment & Plan:

## 2015-01-24 NOTE — Assessment & Plan Note (Signed)
Ok for viagra refill 

## 2015-01-24 NOTE — Assessment & Plan Note (Signed)
stable overall by history and exam, recent data reviewed with pt, and pt to continue medical treatment as before,  to f/u any worsening symptoms or concerns Lab Results  Component Value Date   HGBA1C 6.2 02/28/2014   

## 2015-01-24 NOTE — Progress Notes (Signed)
Pre visit review using our clinic review tool, if applicable. No additional management support is needed unless otherwise documented below in the visit note. 

## 2015-02-24 ENCOUNTER — Telehealth: Payer: Self-pay | Admitting: Physician Assistant

## 2015-02-24 NOTE — Telephone Encounter (Signed)
Patient called report he had some fluttering in his chest last night. He is otherwise asymptomatic. He's had no problems this morning.  Recommended he call the office to schedule an annual appointment as he has not been seen since October 2015.  Genisis Sonnier, PAC

## 2015-02-28 ENCOUNTER — Other Ambulatory Visit: Payer: Self-pay | Admitting: Internal Medicine

## 2015-03-11 ENCOUNTER — Encounter: Payer: Self-pay | Admitting: Internal Medicine

## 2015-03-11 ENCOUNTER — Ambulatory Visit (INDEPENDENT_AMBULATORY_CARE_PROVIDER_SITE_OTHER): Payer: Medicare Other | Admitting: Internal Medicine

## 2015-03-11 ENCOUNTER — Telehealth: Payer: Self-pay | Admitting: Family Medicine

## 2015-03-11 VITALS — BP 136/86 | HR 76 | Temp 98.3°F | Resp 20 | Ht 70.0 in | Wt 167.0 lb

## 2015-03-11 DIAGNOSIS — M25512 Pain in left shoulder: Secondary | ICD-10-CM

## 2015-03-11 DIAGNOSIS — M79671 Pain in right foot: Secondary | ICD-10-CM | POA: Diagnosis not present

## 2015-03-11 DIAGNOSIS — I10 Essential (primary) hypertension: Secondary | ICD-10-CM

## 2015-03-11 MED ORDER — HYDROCODONE-ACETAMINOPHEN 5-325 MG PO TABS: 1.0000 | ORAL_TABLET | Freq: Four times a day (QID) | ORAL | Status: DC | PRN

## 2015-03-11 NOTE — Progress Notes (Signed)
Pre visit review using our clinic review tool, if applicable. No additional management support is needed unless otherwise documented below in the visit note. 

## 2015-03-11 NOTE — Telephone Encounter (Signed)
Corrine stated patient needed to make appointment with Dr. Tamala Julian.  Patient states he has seen Dr. Tamala Julian before and does not wish to see him again.

## 2015-03-11 NOTE — Telephone Encounter (Signed)
Pt was not referred to dr Tamala Julian, as the referral did not mention him  I will forward this also to [facility]

## 2015-03-11 NOTE — Patient Instructions (Signed)
Please take all new medication as prescribed - the hydrocodone for pain if needed  Please continue all other medications as before, and refills have been done if requested.  Please have the pharmacy call with any other refills you may need.  Please keep your appointments with your specialists as you may have planned  You will be contacted regarding the referral for: left shoulder MRI, orthopedic, and podiatry

## 2015-03-11 NOTE — Progress Notes (Signed)
Subjective:    Patient ID: Phillip [name], male    DOB: 06/14/1945, 70 y.o.   MRN: [id number]  HPI  Here with 1 wk onset worsening left shoulder pain, sharp, mild to mod but worsening recurrent, worse to abduct/foward elevated, better to rest, not assoc with other pain, Pt denies chest pain, increased sob or doe, wheezing, orthopnea, PND, increased LE swelling, palpitations, dizziness or syncope.  Does also have significant right foot plantar pain, worse to ambulate, better to rest, mild to mod, recurrent, not assoc with trauma or swelling.  Pt denies fever, wt loss, night sweats, loss of appetite, or other constitutional symptoms Past Medical History  Diagnosis Date  . Arthritis   . Hypertension   . Hyperlipidemia   . Anxiety   . Depression   . CAD, multiple vessel   . History of colon polyps   . Insomnia   . Bipolar depression ([place])   . GERD (gastroesophageal reflux disease)   . Chronic pain disorder     chronic bilat shoulder pain and feet pain  . Stented coronary artery 01/16/2013    X 2  . Iron deficiency anemia 01/16/2013  . Unspecified vitamin D deficiency 01/17/2013  . Erectile dysfunction 01/17/2013  . Mixed hyperlipidemia 03/08/2013  . Anemia   . Systolic murmur   . Status post insertion of drug-eluting stent into left anterior descending (LAD) artery for coronary artery disease    Past Surgical History  Procedure Laterality Date  . Eye surgery    . Coronary angioplasty with stent placement  about 2014    current Cardiology: Dr Orene Desanctis  . Carotid stent  2008    reports that he quit smoking about 13 years ago. His smoking use included Cigarettes. He has a 20 pack-year smoking history. His smokeless tobacco use includes Chew. He reports that he drinks alcohol. He reports that he does not use illicit drugs. family history includes Alzheimer's disease in his brother; Cancer in his maternal grandfather, maternal grandmother, and mother; Heart disease in his maternal  grandmother, mother, and paternal grandfather; Hyperlipidemia in his maternal grandmother; Lung cancer in his father; Stroke in his maternal grandmother and paternal grandfather. Allergies  Allergen Reactions  . Lipitor [Atorvastatin] Other (See Comments)    myalgia   Current Outpatient Prescriptions on File Prior to Visit  Medication Sig Dispense Refill  . ALPRAZolam (XANAX) 1 MG tablet TAKE 1/2 TO 1 TABLET BY MOUTH TWICE DAILY AS NEEDED 60 tablet 2  . amLODipine (NORVASC) 2.5 MG tablet Take 1 tablet (2.5 mg total) by mouth daily. 90 tablet 3  . aspirin 81 MG tablet Take 81 mg by mouth daily.    . carbamazepine (TEGRETOL) 200 MG tablet TAKE 1 TABLET BY MOUTH TWICE DAILY 180 tablet 1  . carvedilol (COREG) 25 MG tablet TAKE 1 TABLET BY MOUTH TWICE DAILY WITH A MEAL 180 tablet 1  . clobetasol cream (TEMOVATE) 0.05 % APPLY TOPICALLY TWICE DAILY AS NEEDED 60 g 1  . clopidogrel (PLAVIX) 75 MG tablet Take 1 tablet (75 mg total) by mouth daily. 90 tablet 0  . Cyanocobalamin (VITAMIN B 12 PO) Take by mouth.    . fenofibrate 54 MG tablet Take 1 tablet (54 mg total) by mouth daily. 90 tablet 3  . folic acid (FOLVITE) 1 MG tablet TAKE 1 TABLET BY MOUTH DAILY 90 tablet 0  . hydrOXYzine (ATARAX/VISTARIL) 25 MG tablet TAKE 1 TABLET BY MOUTH 3 TIMES DAILY AS NEEDED 270 tablet 1  . leflunomide (  ARAVA) 20 MG tablet Take 1 tablet by mouth daily.    . Omega-3 Fatty Acids (FISH OIL PO) Take by mouth daily.    . pantoprazole (PROTONIX) 40 MG tablet TAKE 1 TABLET BY MOUTH TWICE DAILY 180 tablet 3  . predniSONE (DELTASONE) 5 MG tablet Take 2.5 mg by mouth daily with breakfast. Take one tablet daily    . ramipril (ALTACE) 2.5 MG capsule Take 1 capsule (2.5 mg total) by mouth daily. Take 2.5 mg by mouth daily. 90 capsule 3  . sildenafil (REVATIO) 20 MG tablet Take 3 pills daily as needed 60 tablet 2  . simvastatin (ZOCOR) 40 MG tablet TAKE ONE (1) TABLET BY MOUTH AT BEDTIME 90 tablet 0  . traZODone (DESYREL) 50 MG  tablet TAKE 1/2 TO 1 TABLETS BY MOUTH AT BEDTIME AS NEEDED FOR SLEEP 90 tablet 5   No current facility-administered medications on file prior to visit.   Review of Systems  Constitutional: Negative for unusual diaphoresis or night sweats HENT: Negative for ringing in ear or discharge Eyes: Negative for double vision or worsening visual disturbance.  Respiratory: Negative for choking and stridor.   Gastrointestinal: Negative for vomiting or other signifcant bowel change Genitourinary: Negative for hematuria or change in urine volume.  Musculoskeletal: Negative for other MSK pain or swelling Skin: Negative for color change and worsening wound.  Neurological: Negative for tremors and numbness other than noted  Psychiatric/Behavioral: Negative for decreased concentration or agitation other than above       Objective:   Physical Exam BP 136/86 mmHg  Pulse 76  Temp(Src) 98.3 F (36.8 C) (Oral)  Resp 20  Ht 5\' 10"  (1.778 m)  Wt 167 lb (75.751 kg)  BMI 23.96 kg/m2  SpO2 95% VS noted,  Constitutional: Pt appears in no significant distress HENT: Head: NCAT.  Right Ear: External ear normal.  Left Ear: External ear normal.  Eyes: . Pupils are equal, round, and reactive to light. Conjunctivae and EOM are normal Neck: Normal range of motion. Neck supple.  Cardiovascular: Normal rate and regular rhythm.   Pulmonary/Chest: Effort normal and breath sounds without rales or wheezing.  Neurological: Pt is alert. Not confused , motor grossly intact Skin: Skin is warm. No rash, no LE edema Psychiatric: Pt behavior is normal. No agitation.  Right foot with tender plantar heel Left shoulder with pain on reduced ROM    Assessment & Plan:

## 2015-03-17 NOTE — Telephone Encounter (Signed)
Noted. Waiting on office note to be completed.

## 2015-03-18 ENCOUNTER — Ambulatory Visit
Admission: RE | Admit: 2015-03-18 | Discharge: 2015-03-18 | Disposition: A | Discharge status: Home / Self Care | Payer: Medicare Other | Source: Ambulatory Visit | Attending: Internal Medicine | Admitting: Internal Medicine

## 2015-03-18 DIAGNOSIS — M25512 Pain in left shoulder: Secondary | ICD-10-CM

## 2015-03-18 NOTE — Assessment & Plan Note (Addendum)
C/w likely mild plantar fasciitis, for nsaid prn, stretching excercises, soft soled shoes, refer podiatry

## 2015-03-18 NOTE — Assessment & Plan Note (Signed)
stable overall by history and exam, recent data reviewed with pt, and pt to continue medical treatment as before,  to f/u any worsening symptoms or concerns BP Readings from Last 3 Encounters:  03/11/15 136/86  01/24/15 154/76  09/19/14 136/70

## 2015-03-18 NOTE — Assessment & Plan Note (Signed)
?   Rot cuff vs arthritis, for pain control, avoid overuse, for left shoulder MRI, ortho referral

## 2015-03-19 ENCOUNTER — Encounter: Payer: Self-pay | Admitting: Internal Medicine

## 2015-03-20 ENCOUNTER — Ambulatory Visit (INDEPENDENT_AMBULATORY_CARE_PROVIDER_SITE_OTHER): Payer: Medicare Other

## 2015-03-20 ENCOUNTER — Ambulatory Visit (INDEPENDENT_AMBULATORY_CARE_PROVIDER_SITE_OTHER): Payer: Medicare Other | Admitting: Sports Medicine

## 2015-03-20 ENCOUNTER — Encounter: Payer: Self-pay | Admitting: Sports Medicine

## 2015-03-20 DIAGNOSIS — M779 Enthesopathy, unspecified: Secondary | ICD-10-CM

## 2015-03-20 DIAGNOSIS — M2040 Other hammer toe(s) (acquired), unspecified foot: Secondary | ICD-10-CM

## 2015-03-20 DIAGNOSIS — M79673 Pain in unspecified foot: Secondary | ICD-10-CM

## 2015-03-20 MED ORDER — TRIAMCINOLONE ACETONIDE 10 MG/ML IJ SUSP: 10.0000 mg | Freq: Once | INTRAMUSCULAR | Status: DC

## 2015-03-20 NOTE — Progress Notes (Signed)
Patient ID: Phillip [name], male   DOB: Jan 18, 1946, 70 y.o.   MRN: [id number] Subjective: Phillip [name] is a 70 y.o. male patient who presents to office for evaluation of Right> Left foot pain. Patient complains of progressive pain especially over the last few years in the Right>Left foot at the ball of the foot.  States that pain is made worse with walking barefoot states that he has tried ice topical creams which seems to help a little bit thin pain comes back when walking barefoot. Denies any recent trauma or injury. Denies history of diabetes. Patient denies any other pedal complaints. Denies injury/trip/fall/sprain/any causative factors.   Patient Active Problem List   Diagnosis Date Noted  . Left shoulder pain 03/11/2015  . Right foot pain 03/11/2015  . Skin abscess 09/19/2014  . Elevated blood sugar 02/27/2014  . Effort angina ([place]) 12/17/2013  . Rheumatoid arthritis ([place]) 05/23/2013  . Polyarthralgia 05/15/2013  . Myalgia 05/15/2013  . Mixed hyperlipidemia 03/08/2013  . Left hand pain 03/07/2013  . Left wrist pain 03/07/2013  . Right ankle pain 03/07/2013  . Unspecified vitamin D deficiency 01/17/2013  . Erectile dysfunction 01/17/2013  . Rash and nonspecific skin eruption 01/16/2013  . Preventative health care 01/16/2013  . 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 Bilateral hearing loss 01/16/2013  . Stented coronary artery 01/16/2013  . Iron deficiency anemia 01/16/2013  . Arthritis   . Hypertension   . Hyperlipidemia   . Anxiety   . Depression   . CAD, multiple vessel   . History of colon polyps   . Insomnia   . Bipolar depression ([place])   . GERD (gastroesophageal reflux disease)   . Chronic pain disorder    Current Outpatient Prescriptions on File Prior to Visit  Medication Sig Dispense Refill  . ALPRAZolam (XANAX) 1 MG tablet TAKE 1/2 TO 1 TABLET BY MOUTH TWICE DAILY AS NEEDED 60 tablet 2  . amLODipine (NORVASC) 2.5 MG tablet Take 1 tablet (2.5 mg total) by mouth  daily. 90 tablet 3  . aspirin 81 MG tablet Take 81 mg by mouth daily.    . carbamazepine (TEGRETOL) 200 MG tablet TAKE 1 TABLET BY MOUTH TWICE DAILY 180 tablet 1  . carvedilol (COREG) 25 MG tablet TAKE 1 TABLET BY MOUTH TWICE DAILY WITH A MEAL 180 tablet 1  . clobetasol cream (TEMOVATE) 0.05 % APPLY TOPICALLY TWICE DAILY AS NEEDED 60 g 1  . clopidogrel (PLAVIX) 75 MG tablet Take 1 tablet (75 mg total) by mouth daily. 90 tablet 0  . Cyanocobalamin (VITAMIN B 12 PO) Take by mouth.    . fenofibrate 54 MG tablet Take 1 tablet (54 mg total) by mouth daily. 90 tablet 3  . folic acid (FOLVITE) 1 MG tablet TAKE 1 TABLET BY MOUTH DAILY 90 tablet 0  . HYDROcodone-acetaminophen (NORCO/VICODIN) 5-325 MG tablet Take 1 tablet by mouth every 6 (six) hours as needed for moderate pain. 60 tablet 0  . hydrOXYzine (ATARAX/VISTARIL) 25 MG tablet TAKE 1 TABLET BY MOUTH 3 TIMES DAILY AS NEEDED 270 tablet 1  . leflunomide (ARAVA) 20 MG tablet Take 1 tablet by mouth daily.    . Omega-3 Fatty Acids (FISH OIL PO) Take by mouth daily.    . pantoprazole (PROTONIX) 40 MG tablet TAKE 1 TABLET BY MOUTH TWICE DAILY 180 tablet 3  . predniSONE (DELTASONE) 5 MG tablet Take 2.5 mg by mouth daily with breakfast. Take one tablet daily    . ramipril (ALTACE) 2.5 MG capsule  Take 1 capsule (2.5 mg total) by mouth daily. Take 2.5 mg by mouth daily. 90 capsule 3  . sildenafil (REVATIO) 20 MG tablet Take 3 pills daily as needed 60 tablet 2  . simvastatin (ZOCOR) 40 MG tablet TAKE ONE (1) TABLET BY MOUTH AT BEDTIME 90 tablet 0  . traZODone (DESYREL) 50 MG tablet TAKE 1/2 TO 1 TABLETS BY MOUTH AT BEDTIME AS NEEDED FOR SLEEP 90 tablet 5   No current facility-administered medications on file prior to visit.   Allergies  Allergen Reactions  . Lipitor [Atorvastatin] Other (See Comments)    myalgia     Objective:  General: Alert and oriented x3 in no acute distress  Dermatology: No open lesions bilateral lower extremities, no webspace  macerations, no ecchymosis bilateral, all nails x 10 are well manicured.  Vascular: Dorsalis Pedis and Posterior Tibial pedal pulses 1/4, Capillary Fill Time 3 seconds,(+) pedal hair growth bilateral, no edema bilateral lower extremities, Temperature gradient within normal limits.  Neurology: Gross sensation intact via light touch bilateral, Protective sensation intact  with Thornell Mule Monofilament to all pedal sites, No babinski sign present bilateral. (- )Tinels sign or Mulder sign.   Musculoskeletal: Mild tenderness with palpation at 2nd mtpj on Right>Left with associated hammertoe deformity with mild dorsal dislocation, No pain with calf compression bilateral. Ankle and pedal joint range of motion within normal limits except at second hammertoe deformities where there is mild limitation and pain, right over the left. Pes planus foot type.Strength within normal limits in all groups bilateral.    Xrays  Right/Left Foot:  Normal osseous mineralization, significant pes planus deformity with midtarsal breach and midfoot osteoarthritis, right foot greater than left. There is significant hammertoe deformity with slight dorsal dislocation second greater than third, fourth and fifth, there is no fracture, soft tissues within normal limits, no foreign body.   Assessment and Plan: Problem List Items Addressed This Visit    None    Visit Diagnoses    Foot pain, unspecified laterality    -  Primary    R>L 2nd MTPJ    Relevant Medications    triamcinolone acetonide (KENALOG) 10 MG/ML injection 10 mg (Start on 03/20/2015  3:30 PM)    Other Relevant Orders    DG Foot 2 Views Right    DG Foot 2 Views Left    Capsulitis        Relevant Medications    triamcinolone acetonide (KENALOG) 10 MG/ML injection 10 mg (Start on 03/20/2015  3:30 PM)    Hammertoe, unspecified laterality          - Complete examination performed - Xrays reviewed - Discussed treatement options for capsulitis secondary to  hammertoe  - After oral consent and aseptic prep, injected a mixture containing 1 ml of 2%  plain lidocaine, 1 ml 0.5% plain marcaine, 0.5 ml of kenalog 10 and 0.5 ml of dexamethasone phosphate into right 2nd MTPJ. Post-injection care discussed with patient.  - Gave patient  met pads to use daily and instructed on use - Recommend good supportive shoes and inserts and to refrain from walking barefoot - Recommend oral anti-inflammatories and topical anti-inflammatory cream - Recommend ice, Epsom salt soaks, rest, elevation as needed -Patient to return to office as needed or sooner if condition worsens.  Landis Martins, DPM

## 2015-03-25 ENCOUNTER — Other Ambulatory Visit: Payer: Self-pay | Admitting: Internal Medicine

## 2015-03-25 NOTE — Telephone Encounter (Signed)
Medication printed signed and faxed

## 2015-03-25 NOTE — Telephone Encounter (Signed)
Please advise, patient needs refill on xanax 

## 2015-03-25 NOTE — Telephone Encounter (Signed)
Xanax Done hardcopy to [facility]  Fenofibrate done erx

## 2015-03-31 ENCOUNTER — Encounter: Payer: Self-pay | Admitting: Cardiovascular Disease

## 2015-03-31 ENCOUNTER — Ambulatory Visit (INDEPENDENT_AMBULATORY_CARE_PROVIDER_SITE_OTHER): Payer: Medicare Other | Admitting: Cardiovascular Disease

## 2015-03-31 VITALS — BP 130/62 | HR 72 | Ht 70.0 in | Wt 168.4 lb

## 2015-03-31 DIAGNOSIS — I2510 Atherosclerotic heart disease of native coronary artery without angina pectoris: Secondary | ICD-10-CM | POA: Diagnosis not present

## 2015-03-31 DIAGNOSIS — I10 Essential (primary) hypertension: Secondary | ICD-10-CM | POA: Diagnosis not present

## 2015-03-31 DIAGNOSIS — E785 Hyperlipidemia, unspecified: Secondary | ICD-10-CM

## 2015-03-31 MED ORDER — ROSUVASTATIN CALCIUM 20 MG PO TABS: 20.0000 mg | ORAL_TABLET | Freq: Every day | ORAL | Status: DC

## 2015-03-31 NOTE — Progress Notes (Signed)
Patient ID: Phillip [name], male   DOB: December 29, 1945, 70 y.o.   MRN: [id number]    Cardiology Office Note    Date:  03/31/2015   ID:  Phillip [name], DOB 17-Aug-1945, MRN [id number]  PCP:  Cathlean Cower, MD  Cardiologist:   Sanda Klein, MD   Chief Complaint  Patient presents with  . Annual Exam    had some unsual chest discomfort, has shortness of breathvwith exercise, edema, pain or cramping in legs, lightheadedness or dizziness    History of Present Illness:  Phillip [name] is a 70 y.o. male with recent chest discomfort, known CAD, HTN and hyperlipidemia.  Has substantial left shoulder pain that is clearly positional and MRI shows a supraspinatus full thickness tear and several other articular abnormalities. When I last saw him he was taking pravastatin, but this has since been stopped and replaced with simvastatin. He does not recall having side effects with the pravastatin. His most recent LDL cholesterol from January 2016 was markedly elevated at 142 (I suspect this was done without any therapy).  He is describing brief mild palpitations, rather than true chest discomfort. He is able to pull his boat up and down the ramp on his fishing trips, without angina pectoris. He denies exertional chest discomfort. He has some mild dyspnea on exertion, similar to the past, denies lower extremity edema, claudication, dizziness or syncope or focal neurological events. He states that his memory is not what it used to be.  He received drug-eluting stents in 2008 (3.028 drug-eluting Cypher to proximal ramus intermedius) and 2010 (2.513 drug-eluting Cypher to OM 1 branch of left circumflex; IVUS showed 40% ostial left main, 60% proximal LAD, 50% mid LAD) and had a normal perfusion study in Nov 2015. He has normal left ventricular systolic function with an ejection fraction of 55% by scintigraphy in 2012. Intolerant to multiple statins, took pravastatin for several years.   Past Medical History  Diagnosis  Date  . Arthritis   . Hypertension   . Hyperlipidemia   . Anxiety   . Depression   . CAD, multiple vessel   . History of colon polyps   . Insomnia   . Bipolar depression ([place])   . GERD (gastroesophageal reflux disease)   . Chronic pain disorder     chronic bilat shoulder pain and feet pain  . Stented coronary artery 01/16/2013    X 2  . Iron deficiency anemia 01/16/2013  . Unspecified vitamin D deficiency 01/17/2013  . Erectile dysfunction 01/17/2013  . Mixed hyperlipidemia 03/08/2013  . Anemia   . Systolic murmur   . Status post insertion of drug-eluting stent into left anterior descending (LAD) artery for coronary artery disease     Past Surgical History  Procedure Laterality Date  . Eye surgery    . Coronary angioplasty with stent placement  about 2014    current Cardiology: Dr Orene Desanctis  . Carotid stent  2008    Outpatient Prescriptions Prior to Visit  Medication Sig Dispense Refill  . ALPRAZolam (XANAX) 1 MG tablet TAKE 1/2 TO 1 TABLET BY MOUTH TWICE DAILY AS NEEDED 60 tablet 2  . amLODipine (NORVASC) 2.5 MG tablet Take 1 tablet (2.5 mg total) by mouth daily. 90 tablet 3  . aspirin 81 MG tablet Take 81 mg by mouth daily.    . carbamazepine (TEGRETOL) 200 MG tablet TAKE 1 TABLET BY MOUTH TWICE DAILY 180 tablet 1  . carvedilol (COREG) 25 MG tablet TAKE 1 TABLET BY MOUTH TWICE  DAILY WITH A MEAL 180 tablet 1  . clobetasol cream (TEMOVATE) 0.05 % APPLY TOPICALLY TWICE DAILY AS NEEDED 60 g 1  . clopidogrel (PLAVIX) 75 MG tablet Take 1 tablet (75 mg total) by mouth daily. 90 tablet 0  . Cyanocobalamin (VITAMIN B 12 PO) Take by mouth.    . fenofibrate 54 MG tablet TAKE 1 TABLET BY MOUTH ONCE DAILY 90 tablet 3  . folic acid (FOLVITE) 1 MG tablet TAKE 1 TABLET BY MOUTH DAILY 90 tablet 0  . HYDROcodone-acetaminophen (NORCO/VICODIN) 5-325 MG tablet Take 1 tablet by mouth every 6 (six) hours as needed for moderate pain. 60 tablet 0  . hydrOXYzine (ATARAX/VISTARIL) 25 MG tablet TAKE  1 TABLET BY MOUTH 3 TIMES DAILY AS NEEDED 270 tablet 1  . leflunomide (ARAVA) 20 MG tablet Take 1 tablet by mouth daily.    . Omega-3 Fatty Acids (FISH OIL PO) Take by mouth daily.    . pantoprazole (PROTONIX) 40 MG tablet TAKE 1 TABLET BY MOUTH TWICE DAILY 180 tablet 3  . predniSONE (DELTASONE) 5 MG tablet Take 2.5 mg by mouth daily with breakfast. Take one tablet daily    . ramipril (ALTACE) 2.5 MG capsule Take 1 capsule (2.5 mg total) by mouth daily. Take 2.5 mg by mouth daily. 90 capsule 3  . sildenafil (REVATIO) 20 MG tablet Take 3 pills daily as needed 60 tablet 2  . simvastatin (ZOCOR) 40 MG tablet TAKE ONE (1) TABLET BY MOUTH AT BEDTIME 90 tablet 0  . traZODone (DESYREL) 50 MG tablet TAKE 1/2 TO 1 TABLETS BY MOUTH AT BEDTIME AS NEEDED FOR SLEEP 90 tablet 5   Facility-Administered Medications Prior to Visit  Medication Dose Route Frequency Provider Last Rate Last Dose  . triamcinolone acetonide (KENALOG) 10 MG/ML injection 10 mg  10 mg Other Once Landis Martins, DPM         Allergies:   Lipitor   Social History   Social History  . Marital Status: Single    Spouse Name: N/A  . Number of Children: N/A  . Years of Education: N/A   Social History Main Topics  . Smoking status: Former Smoker -- 1.00 packs/day for 20 years    Types: Cigarettes    Quit date: 02/22/2002  . Smokeless tobacco: Current User    Types: Chew  . Alcohol Use: Yes  . Drug Use: No  . Sexual Activity: Not Asked   Other Topics Concern  . None   Social History Narrative     Family History:  The patient's family history includes Alzheimer's disease in his brother; Cancer in his maternal grandfather, maternal grandmother, and mother; Heart disease in his maternal grandmother, mother, and paternal grandfather; Hyperlipidemia in his maternal grandmother; Lung cancer in his father; Stroke in his maternal grandmother and paternal grandfather.   ROS:   Please see the history of present illness.    ROS All  other systems reviewed and are negative.   PHYSICAL EXAM:   VS:  BP 130/62 mmHg  Ht 5\' 10"  (1.778 m)  Wt 76.374 kg (168 lb 6 oz)  BMI 24.16 kg/m2   GEN: Well nourished, well developed, in no acute distress HEENT: normal Neck: no JVD, carotid bruits, or masses Cardiac: RRR; no murmurs, rubs, or gallops,no edema  Respiratory:  clear to auscultation bilaterally, normal work of breathing GI: soft, nontender, nondistended, + BS MS: no deformity or atrophy Skin: warm and dry, no rash Neuro:  Alert and Oriented x 3, Strength and  sensation are intact Psych: euthymic mood, full affect  Wt Readings from Last 3 Encounters:  03/31/15 76.374 kg (168 lb 6 oz)  03/11/15 75.751 kg (167 lb)  01/24/15 73.029 kg (161 lb)      Studies/Labs Reviewed:   EKG:  EKG is ordered today.  The ekg ordered today demonstrates normal sinus rhythm with first-degree A-V block (PR 218 ms), QTC 409 ms. No signs of ischemic repolarization abnormalities  Recent Labs: No results found for requested labs within last 365 days.   Lipid Panel    Component Value Date/Time   CHOL 223* 02/27/2014 1013   TRIG 206.0* 02/27/2014 1013   HDL 40.60 02/27/2014 1013   CHOLHDL 5 02/27/2014 1013   VLDL 41.2* 02/27/2014 1013   LDLCALC 93 10/09/2013 0920   LDLDIRECT 142.5 02/27/2014 1013    ASSESSMENT:    1. CAD, multiple vessel   2. Essential hypertension   3. Hyperlipidemia      PLAN:  In order of problems listed above:  1. CAD: Remote history of stents, currently asymptomatic, CCS class I. Low risk nuclear perfusion study in November 2015. If he should require shoulder surgery, I think he can stop his clopidogrel and is at low risk for major cardiac complications with that surgery. 2. HTN: good control 3. HLP:  Off statin, his LDL was unacceptably high. I'm concerned about the interaction between amlodipine in the high dose of simvastatin that he is taking. Previously intolerant to atorvastatin, but took  pravastatin for a longtime without problems. Not sure why this was stopped. Target LDL cholesterol is less than 70. Heart to achieve that with pravastatin. Now that rosuvastatin is generic will try that instead.    Medication Adjustments/Labs and Tests Ordered: Current medicines are reviewed at length with the patient today.  Concerns regarding medicines are outlined above.  Medication changes, Labs and Tests ordered today are listed in the Patient Instructions below. There are no Patient Instructions on file for this visit.     Mikael Spray, MD  03/31/2015 8:14 AM    [facility] [address], [place], [place]  [address] Phone: [phone]; Fax: [phone]

## 2015-03-31 NOTE — Patient Instructions (Signed)
Medication Instructions:   STOP SIMVASTATIN  START ROSUVASTATIN 20 MG ONCE DAILY  Labwork:  Your physician recommends that you return for lab work in: [place] TO LAB WORK  Follow-Up:  Your physician wants you to follow-up in: [place] will receive a reminder letter in the mail two months in advance. If you don't receive a letter, please call our office to schedule the follow-up appointment.   If you need a refill on your cardiac medications before your next appointment, please call your pharmacy.

## 2015-04-02 DIAGNOSIS — S46012A Strain of muscle(s) and tendon(s) of the rotator cuff of left shoulder, initial encounter: Secondary | ICD-10-CM | POA: Diagnosis not present

## 2015-04-07 ENCOUNTER — Telehealth: Payer: Self-pay | Admitting: *Deleted

## 2015-04-07 NOTE — Telephone Encounter (Signed)
Requesting surgical clearance:   1. Type of surgery: LEFT SHOULDER W/PARTIAL ACROMIOPLASTY W/WO CORACOACROMIAL RELEASE  2. Surgeon: DR. SUPPLE  3. Surgical date: PENDING CLEARANCE  4. Medications that need to be held: PATIENT IS ON PLAVIX AND ASA BUT CLEARANCE LETTER DOES NOT ASK IF THESE CAN BE HELD

## 2015-04-08 ENCOUNTER — Encounter: Payer: Self-pay | Admitting: Cardiovascular Disease

## 2015-04-08 NOTE — Telephone Encounter (Signed)
Sent via epic 

## 2015-04-28 ENCOUNTER — Other Ambulatory Visit: Payer: Self-pay | Admitting: Internal Medicine

## 2015-04-28 NOTE — Telephone Encounter (Signed)
Please advise patient is requesting refill on medications  

## 2015-04-28 NOTE — Telephone Encounter (Signed)
Xanax is too soon as 3 mo rx done on jan 31 already  OK for ramipril - done erx

## 2015-04-29 ENCOUNTER — Telehealth: Payer: Self-pay | Admitting: Internal Medicine

## 2015-04-29 NOTE — Telephone Encounter (Signed)
unfort this is a new problem/acute problem, and due to concerns about conservative antibiotic stewardship, the office policy is such that we normally dont fill antibx by phone request, normally needs visit to determine need and which one, thank

## 2015-04-29 NOTE — Telephone Encounter (Signed)
Pt states he has cold and congestion that everyone else has and he is wanting an antibiotic sent in for him.  I did inform him he would need an appointment but he claims Dr. Jenny Reichmann has sent some in before Please advise  [place]

## 2015-04-29 NOTE — Telephone Encounter (Signed)
See below, not sure went through

## 2015-05-05 ENCOUNTER — Other Ambulatory Visit: Payer: Self-pay | Admitting: Internal Medicine

## 2015-05-05 NOTE — Telephone Encounter (Signed)
Pharmacy stated that they have it and will fill for pt.

## 2015-05-05 NOTE — Telephone Encounter (Signed)
Patient is advising that [facility] is advising that they do not have the march refill for xanax. i advised that we RX 3 months on 03/25/2015. He insists that they do nto have it. Please give [facility] a call to give a verbal on the final refill of xanax

## 2015-05-06 NOTE — Telephone Encounter (Signed)
Xanax too soon it seems, as the last rx for total 3 mo was done Mar 25, 2015

## 2015-05-14 ENCOUNTER — Other Ambulatory Visit (INDEPENDENT_AMBULATORY_CARE_PROVIDER_SITE_OTHER): Payer: Medicare Other

## 2015-05-14 ENCOUNTER — Encounter: Payer: Self-pay | Admitting: Internal Medicine

## 2015-05-14 DIAGNOSIS — Z0000 Encounter for general adult medical examination without abnormal findings: Secondary | ICD-10-CM

## 2015-05-14 DIAGNOSIS — R739 Hyperglycemia, unspecified: Secondary | ICD-10-CM

## 2015-05-14 DIAGNOSIS — R7309 Other abnormal glucose: Secondary | ICD-10-CM

## 2015-05-14 LAB — HEPATIC FUNCTION PANEL
ALBUMIN: 4.5 g/dL (ref 3.5–5.2)
ALT: 17 U/L (ref 0–53)
AST: 16 U/L (ref 0–37)
Alkaline Phosphatase: 55 U/L (ref 39–117)
BILIRUBIN TOTAL: 0.3 mg/dL (ref 0.2–1.2)
Bilirubin, Direct: 0.1 mg/dL (ref 0.0–0.3)
Total Protein: 7.4 g/dL (ref 6.0–8.3)

## 2015-05-14 LAB — CBC WITH DIFFERENTIAL/PLATELET
BASOS ABS: 0 10*3/uL (ref 0.0–0.1)
Basophils Relative: 0.6 % (ref 0.0–3.0)
EOS ABS: 0.3 10*3/uL (ref 0.0–0.7)
Eosinophils Relative: 4.5 % (ref 0.0–5.0)
HCT: 33.3 % — ABNORMAL LOW (ref 39.0–52.0)
Hemoglobin: 11.3 g/dL — ABNORMAL LOW (ref 13.0–17.0)
LYMPHS ABS: 1.5 10*3/uL (ref 0.7–4.0)
Lymphocytes Relative: 25 % (ref 12.0–46.0)
MCHC: 34.1 g/dL (ref 30.0–36.0)
MCV: 88.1 fl (ref 78.0–100.0)
MONO ABS: 0.6 10*3/uL (ref 0.1–1.0)
Monocytes Relative: 9.7 % (ref 3.0–12.0)
NEUTROS ABS: 3.5 10*3/uL (ref 1.4–7.7)
NEUTROS PCT: 60.2 % (ref 43.0–77.0)
PLATELETS: 364 10*3/uL (ref 150.0–400.0)
RBC: 3.77 Mil/uL — ABNORMAL LOW (ref 4.22–5.81)
RDW: 13.8 % (ref 11.5–15.5)
WBC: 5.8 10*3/uL (ref 4.0–10.5)

## 2015-05-14 LAB — URINALYSIS, ROUTINE W REFLEX MICROSCOPIC
BILIRUBIN URINE: NEGATIVE
HGB URINE DIPSTICK: NEGATIVE
KETONES UR: NEGATIVE
Leukocytes, UA: NEGATIVE
Nitrite: NEGATIVE
PH: 6 (ref 5.0–8.0)
RBC / HPF: NONE SEEN (ref 0–?)
Specific Gravity, Urine: 1.015 (ref 1.000–1.030)
TOTAL PROTEIN, URINE-UPE24: NEGATIVE
UROBILINOGEN UA: 0.2 (ref 0.0–1.0)
Urine Glucose: NEGATIVE

## 2015-05-14 LAB — LIPID PANEL
CHOL/HDL RATIO: 3
Cholesterol: 179 mg/dL (ref 0–200)
HDL: 60.7 mg/dL (ref >39.00)
LDL CALC: 85 mg/dL (ref 0–99)
NonHDL: 118.19
TRIGLYCERIDES: 166 mg/dL — AB (ref 0.0–149.0)
VLDL: 33.2 mg/dL (ref 0.0–40.0)

## 2015-05-14 LAB — BASIC METABOLIC PANEL
BUN: 13 mg/dL (ref 6–23)
CALCIUM: 9.6 mg/dL (ref 8.4–10.5)
CO2: 31 mEq/L (ref 19–32)
CREATININE: 0.74 mg/dL (ref 0.40–1.50)
Chloride: 98 mEq/L (ref 96–112)
GFR: 111.12 mL/min (ref >60.00)
Glucose, Bld: 124 mg/dL — ABNORMAL HIGH (ref 70–99)
POTASSIUM: 4.3 meq/L (ref 3.5–5.1)
Sodium: 136 mEq/L (ref 135–145)

## 2015-05-14 LAB — TSH: TSH: 2.22 u[IU]/mL (ref 0.35–4.50)

## 2015-05-14 LAB — HEMOGLOBIN A1C: HEMOGLOBIN A1C: 6.4 % (ref 4.6–6.5)

## 2015-05-14 LAB — PSA: PSA: 0.47 ng/mL (ref 0.10–4.00)

## 2015-06-07 ENCOUNTER — Other Ambulatory Visit: Payer: Self-pay | Admitting: Internal Medicine

## 2015-06-10 ENCOUNTER — Telehealth: Payer: Self-pay | Admitting: Internal Medicine

## 2015-06-10 ENCOUNTER — Telehealth: Payer: Self-pay

## 2015-06-10 NOTE — Telephone Encounter (Signed)
Clinical information given via phone to [facility] at [place]

## 2015-06-10 NOTE — Telephone Encounter (Signed)
Pt called to check the status of the cover my meds for ALPRAZolam (XANAX) 1 MG tablet. Call pt @ [phone]. Thank you!

## 2015-06-12 ENCOUNTER — Telehealth: Payer: Self-pay

## 2015-06-12 NOTE — Telephone Encounter (Signed)
Patient called to educate on Medicare Wellness apt. LVM for the patient to call back to educate and schedule for wellness visit.   

## 2015-06-17 NOTE — Telephone Encounter (Signed)
PA APPROVED 06/11/2015 per pharmacist at [facility]

## 2015-06-20 NOTE — Telephone Encounter (Signed)
Call to Mr. Mickels; LVM to call and schedule AWV at his convenience.

## 2015-06-27 ENCOUNTER — Other Ambulatory Visit: Payer: Self-pay | Admitting: Internal Medicine

## 2015-07-07 ENCOUNTER — Other Ambulatory Visit: Payer: Self-pay | Admitting: Internal Medicine

## 2015-07-08 NOTE — Telephone Encounter (Signed)
Please advise 

## 2015-07-08 NOTE — Telephone Encounter (Signed)
Done hardcopy to Corinne  

## 2015-07-08 NOTE — Telephone Encounter (Signed)
Medication faxed to pharmacy 

## 2015-09-09 ENCOUNTER — Other Ambulatory Visit: Payer: Self-pay | Admitting: Internal Medicine

## 2015-10-03 ENCOUNTER — Telehealth: Payer: Self-pay | Admitting: Cardiovascular Disease

## 2015-10-03 ENCOUNTER — Telehealth: Payer: Self-pay

## 2015-10-03 MED ORDER — NITROGLYCERIN 0.4 MG SL SUBL: 0.4000 mg | SUBLINGUAL_TABLET | SUBLINGUAL | 3 refills | Status: DC | PRN

## 2015-10-03 MED ORDER — NITROGLYCERIN 0.4 MG/SPRAY TL SOLN: 1.0000 | 3 refills | Status: DC | PRN

## 2015-10-03 NOTE — Telephone Encounter (Signed)
Prior auth for Nitrolingual spray submitted to [employer]. Requested an expedited review.

## 2015-10-03 NOTE — Telephone Encounter (Signed)
See operator note. I called patient, he states he had pressure and chest tightness this AM.  Pt notes never had chest pain this intense that he can recall. Rated 8 of 10. Nitro spray was used, but he notes it had been expired for 5 years. he felt OK after 10 mins thought not completely resolved by time EMS arrived. Had EKG, VS done, everything OK, he took ASA. Feels better now and denies pain at present.  Seemingly occurred out of the blue, w minimal exertion. EMS gave him option of going to hospital or not, pt stated no.  We discussed options.  Pt understands if pain returns to go to ER. I have given him the option to see Lurena Joiner Wednesday (next APP availability) but will route to Dr. Sallyanne Kuster to discuss any other options/considerations.   OK to renew/represcribe Nitro spray?

## 2015-10-03 NOTE — Telephone Encounter (Signed)
Left msg for patient to call. 

## 2015-10-03 NOTE — Telephone Encounter (Signed)
New message      Calling to let nurse know that the nytroglycerin was denied at this point. Let message on VM for pt. Please call.

## 2015-10-03 NOTE — Telephone Encounter (Signed)
Patient returned my call. Instructions, including use of nitro and to call 911 -> ER as indicated by Dr. Sallyanne Kuster. Pt willing to take 1st available PA slot, he has been scheduled to see Lurena Joiner on Wed 8/16. Rx refilled to [facility] at patient request. Pt voiced understanding of instructions and thanks for call.

## 2015-10-03 NOTE — Telephone Encounter (Signed)
Have spoken w [employer] who have denied the nitro spray due to not being on formulary.  Submitting for 0.4mg  nitro SL as the spray was denied. Have checked w/ patient who is fine w/ this. He is aware of instructions for use.

## 2015-10-03 NOTE — Telephone Encounter (Signed)
New rx sent, patient aware.

## 2015-10-03 NOTE — Telephone Encounter (Signed)
New Message  Pts daughter in law verbalized pt had to call EMS this morning, pt was having bad tightness in his chest.   Pts daughter in law verbalized he took Nitroglycerin spray and Asprin, by the time EMS arrived the tightness subsided.  EMS checked pt with heart monitor and advised to make appt to be seen soon.  Scheduled for 9.6.17, pt verbalized to daughter in law he wants sooner appt due to seriousness of this morning episode.  Please follow up with pt.

## 2015-10-03 NOTE — Telephone Encounter (Signed)
Please refill nitroglycerin. If he has recurrent chest discomfort lasting more than 20 minutes he should call 911 and have been taken to the emergency room. First available appointment is appropriate.

## 2015-10-08 ENCOUNTER — Ambulatory Visit (INDEPENDENT_AMBULATORY_CARE_PROVIDER_SITE_OTHER): Payer: Medicare Other | Admitting: Cardiology

## 2015-10-08 ENCOUNTER — Encounter: Payer: Self-pay | Admitting: Cardiology

## 2015-10-08 VITALS — BP 138/64 | HR 59 | Ht 71.0 in | Wt 165.2 lb

## 2015-10-08 DIAGNOSIS — E785 Hyperlipidemia, unspecified: Secondary | ICD-10-CM | POA: Diagnosis not present

## 2015-10-08 DIAGNOSIS — N529 Male erectile dysfunction, unspecified: Secondary | ICD-10-CM

## 2015-10-08 DIAGNOSIS — I10 Essential (primary) hypertension: Secondary | ICD-10-CM | POA: Diagnosis not present

## 2015-10-08 DIAGNOSIS — Z9861 Coronary angioplasty status: Secondary | ICD-10-CM

## 2015-10-08 DIAGNOSIS — I208 Other forms of angina pectoris: Secondary | ICD-10-CM

## 2015-10-08 DIAGNOSIS — I2510 Atherosclerotic heart disease of native coronary artery without angina pectoris: Secondary | ICD-10-CM

## 2015-10-08 MED ORDER — AMLODIPINE BESYLATE 5 MG PO TABS: 5.0000 mg | ORAL_TABLET | Freq: Every day | ORAL | 3 refills | Status: DC

## 2015-10-08 NOTE — Assessment & Plan Note (Signed)
Dr Leafy Kindle Rxed with Prednisone

## 2015-10-08 NOTE — Patient Instructions (Addendum)
Medication Changes  Increase Amlodipine to 5 mg daily.   Follow-Up  Keep your appointment with Dr. Sallyanne Kuster on September 28, 2015 at 9:00 am.  (Call if you have anymore Chest Pain)  If you need a refill on your cardiac medications before your next appointment, please call your pharmacy.

## 2015-10-08 NOTE — Assessment & Plan Note (Signed)
Controlled.  

## 2015-10-08 NOTE — Assessment & Plan Note (Signed)
Tolerating Crestor 

## 2015-10-08 NOTE — Progress Notes (Signed)
10/08/2015 Phillip [name]   1946/02/20  [id number]  Primary Physician Phillip Cower, MD Primary Cardiologist: Dr Phillip [name]  HPI:  Pt is a 70 y/o male followed by Dr Phillip [name] with a history of CAD. He received drug-eluting stents in 2008 (3.028 drug-eluting Cypher to proximal ramus intermedius) and 2010 (2.513 drug-eluting Cypher to OM 1 branch of left circumflex; IVUS showed 40% ostial left main, 60% proximal LAD, 50% mid LAD) and had a normal perfusion study in Nov 2015. He has normal left ventricular systolic function with an ejection fraction of 55% by scintigraphy in 2012. He has had problems with statins but is tolerating Crestor 20 mg.           He is in the office today a check after he had an episode of chest pain last week. He tells me he had an episode of SSCP while working on some plumbing. He admits it was a stressful situation as his granddaughter being disruptive while he was doing this. He had about 5 minutes of SSCP-"pressure". No radiation to his neck, arms, or jaw, no associated N/V/D. He took an ASA and a spray of NTG with relief of his symptoms. EMS was called but he was feeling better and declined to go to the hospital. He has had no further episodes. He is active, plays golf, works around the house and yard.     Current Outpatient Prescriptions  Medication Sig Dispense Refill  . ALPRAZolam (XANAX) 1 MG tablet TAKE 1/2 TO 1 TABLET BY MOUTH TWICE DAILY AS NEEDED 60 tablet 2  . amLODipine (NORVASC) 5 MG tablet Take 1 tablet (5 mg total) by mouth daily. 90 tablet 3  . aspirin 81 MG tablet Take 81 mg by mouth daily.    . carbamazepine (TEGRETOL) 200 MG tablet TAKE 1 TABLET BY MOUTH TWICE DAILY 180 tablet 1  . carvedilol (COREG) 25 MG tablet TAKE 1 TABLET BY MOUTH TWICE DAILY WITH A MEAL 180 tablet 1  . clobetasol cream (TEMOVATE) 0.05 % APPLY TOPICALLY TWICE DAILY AS NEEDED 60 g 1  . clopidogrel (PLAVIX) 75 MG tablet TAKE 1 TABLET BY MOUTH DAILY 90 tablet 0  . Cyanocobalamin  (VITAMIN B 12 PO) Take by mouth.    . fenofibrate 54 MG tablet TAKE 1 TABLET BY MOUTH ONCE DAILY 90 tablet 3  . folic acid (FOLVITE) 1 MG tablet TAKE 1 TABLET BY MOUTH DAILY 90 tablet 0  . HYDROcodone-acetaminophen (NORCO/VICODIN) 5-325 MG tablet Take 1 tablet by mouth every 6 (six) hours as needed for moderate pain. 60 tablet 0  . hydrOXYzine (ATARAX/VISTARIL) 25 MG tablet TAKE 1 TABLET BY MOUTH 3 TIMES DAILY AS NEEDED 270 tablet 1  . leflunomide (ARAVA) 20 MG tablet Take 1 tablet by mouth daily.    . nitroGLYCERIN (NITROSTAT) 0.4 MG SL tablet Place 1 tablet (0.4 mg total) under the tongue every 5 (five) minutes as needed for chest pain. 25 tablet 3  . Omega-3 Fatty Acids (FISH OIL PO) Take by mouth daily.    . pantoprazole (PROTONIX) 40 MG tablet TAKE 1 TABLET BY MOUTH TWICE DAILY 180 tablet 3  . predniSONE (DELTASONE) 5 MG tablet Take 2.5 mg by mouth daily with breakfast. Take one tablet daily    . ramipril (ALTACE) 2.5 MG capsule TAKE 1 CAPSULE BY MOUTH ONCE DAILY 90 capsule 3  . rosuvastatin (CRESTOR) 20 MG tablet Take 1 tablet (20 mg total) by mouth daily. 90 tablet 3  . sildenafil (REVATIO) 20 MG  tablet Take 3 pills daily as needed 60 tablet 2  . traZODone (DESYREL) 50 MG tablet TAKE 1/2 TO 1 TABLETS BY MOUTH AT BEDTIME AS NEEDED FOR SLEEP 90 tablet 5   Current Facility-Administered Medications  Medication Dose Route Frequency Provider Last Rate Last Dose  . triamcinolone acetonide (KENALOG) 10 MG/ML injection 10 mg  10 mg Other Once Phillip [name], DPM        Allergies  Allergen Reactions  . Lipitor [Atorvastatin] Other (See Comments)    myalgia    Social History   Social History  . Marital status: Single    Spouse name: N/A  . Number of children: N/A  . Years of education: N/A   Occupational History  . Not on file.   Social History Main Topics  . Smoking status: Former Smoker    Packs/day: 1.00    Years: 20.00    Types: Cigarettes    Quit date: 02/22/2002  .  Smokeless tobacco: Current User    Types: Chew  . Alcohol use Yes  . Drug use: No  . Sexual activity: Not on file   Other Topics Concern  . Not on file   Social History Narrative  . No narrative on file     Review of Systems: General: negative for chills, fever, night sweats or weight changes.  Cardiovascular: negative for chest pain, dyspnea on exertion, edema, orthopnea, palpitations, paroxysmal nocturnal dyspnea or shortness of breath Dermatological: negative for rash Respiratory: negative for cough or wheezing Urologic: negative for hematuria Abdominal: negative for nausea, vomiting, diarrhea, bright red blood per rectum, melena, or hematemesis Neurologic: negative for visual changes, syncope, or dizziness All other systems reviewed and are otherwise negative except as noted above.    Blood pressure 138/64, pulse (!) 59, height 5\' 11"  (1.803 m), weight 165 lb 3.2 oz (74.9 kg).  General appearance: alert, cooperative and no distress Neck: no carotid bruit and no JVD Lungs: clear to auscultation bilaterally Heart: regular rate and rhythm Extremities: extremities normal, atraumatic, no cyanosis or edema Skin: Skin color, texture, turgor normal. No rashes or lesions Neurologic: Grossly normal  EKG NSR/ SB- HR 59,  no acute changes  ASSESSMENT AND PLAN:   Exertional angina (HCC) Episode of anginal sounding chest pain last week, none since  CAD -S/P PCI 2008, 2010 2008 drug-eluting Cypher 3.0 x 28 mm stent to the ramus intermedius artery 2010, drug-eluting Cypher 2.5 x 30 mm stent to the first oblique marginal artery 40% ostial left main, 60% proximal LAD, 60% mid LAD, 30% ramus intermedius in-stent restenosis, 30% proximal LCx, 30-40% proximal and mid RCA by catheterization in 2010 Nuclear stress test 2012 with normal perfusion, EF 55%  Hypertension Controlled  Hyperlipidemia Tolerating Crestor  Rheumatoid arthritis Dr Phillip [name] Rxed with  Prednisone  Erectile dysfunction Uses Viagra prn. He knows not to use if he has taken NTG   PLAN  I suggested Phillip [name] increase his Norvasc to 5 mg daily. He only had this one episode and none since and he is back to his usual activity level. I am hesitant to add a nitrate as he uses Viagra. If he has another episode of chest pain we should proceed with further ischemic work up.   Kerin Ransom PA-C 10/08/2015 1:45 PM

## 2015-10-08 NOTE — Assessment & Plan Note (Signed)
Uses Viagra prn. He knows not to use if he has taken NTG

## 2015-10-08 NOTE — Assessment & Plan Note (Signed)
Episode of anginal sounding chest pain last week, none since

## 2015-10-08 NOTE — Assessment & Plan Note (Signed)
2008 drug-eluting Cypher 3.0 x 28 mm stent to the ramus intermedius artery 2010, drug-eluting Cypher 2.5 x 30 mm stent to the first oblique marginal artery 40% ostial left main, 60% proximal LAD, 60% mid LAD, 30% ramus intermedius in-stent restenosis, 30% proximal LCx, 30-40% proximal and mid RCA by catheterization in 2010 Nuclear stress test 2012 with normal perfusion, EF 55%

## 2015-10-10 ENCOUNTER — Other Ambulatory Visit: Payer: Self-pay | Admitting: Internal Medicine

## 2015-10-15 ENCOUNTER — Encounter: Payer: Self-pay | Admitting: Cardiovascular Disease

## 2015-10-15 ENCOUNTER — Ambulatory Visit (INDEPENDENT_AMBULATORY_CARE_PROVIDER_SITE_OTHER): Payer: Medicare Other | Admitting: Cardiovascular Disease

## 2015-10-15 VITALS — BP 130/72 | HR 70 | Ht 71.0 in | Wt 167.0 lb

## 2015-10-15 DIAGNOSIS — E785 Hyperlipidemia, unspecified: Secondary | ICD-10-CM

## 2015-10-15 DIAGNOSIS — R972 Elevated prostate specific antigen [PSA]: Secondary | ICD-10-CM | POA: Diagnosis not present

## 2015-10-15 DIAGNOSIS — Z9861 Coronary angioplasty status: Secondary | ICD-10-CM

## 2015-10-15 DIAGNOSIS — I10 Essential (primary) hypertension: Secondary | ICD-10-CM

## 2015-10-15 DIAGNOSIS — I2510 Atherosclerotic heart disease of native coronary artery without angina pectoris: Secondary | ICD-10-CM

## 2015-10-15 NOTE — Patient Instructions (Signed)
Dr Croitoru recommends that you schedule a follow-up appointment in 6 months. You will receive a reminder letter in the mail two months in advance. If you don't receive a letter, please call our office to schedule the follow-up appointment.  If you need a refill on your cardiac medications before your next appointment, please call your pharmacy. 

## 2015-10-15 NOTE — Progress Notes (Signed)
Patient ID: Phillip [name], male   DOB: 1945-12-13, 70 y.o.   MRN: [id number]    Cardiology Office Note    Date:  10/15/2015   ID:  Phillip [name], DOB 1945/12/29, MRN [id number]  PCP:  Cathlean Cower, MD  Cardiologist:   Sanda Klein, MD   Chief Complaint  Patient presents with  . Follow-up    History of Present Illness:  Phillip [name] is a 70 y.o. male with recent chest discomfort, known CAD, HTN and hyperlipidemia.  He saw Kerin Ransom on August 16 and his dose of amlodipine was increased for a single episode of exertion related possible angina pectoris; he has not had any chest pain since. He does not of this and why he came to this appointment so soon.  He received drug-eluting stents in 2008 (3.028 drug-eluting Cypher to proximal ramus intermedius) and 2010 (2.513 drug-eluting Cypher to OM 1 branch of left circumflex; IVUS showed 40% ostial left main, 60% proximal LAD, 50% mid LAD) and had a normal perfusion study in Nov 2015. He has normal left ventricular systolic function with an ejection fraction of 55% by scintigraphy in 2012. Intolerant to multiple statins in the past, currently tolerating Crestor.   Past Medical History:  Diagnosis Date  . Anemia   . Anxiety   . Arthritis   . Bipolar depression ([place])   . CAD, multiple vessel   . Chronic pain disorder    chronic bilat shoulder pain and feet pain  . Depression   . Erectile dysfunction 01/17/2013  . GERD (gastroesophageal reflux disease)   . History of colon polyps   . Hyperlipidemia   . Hypertension   . Insomnia   . Iron deficiency anemia 01/16/2013  . Mixed hyperlipidemia 03/08/2013  . Status post insertion of drug-eluting stent into left anterior descending (LAD) artery for coronary artery disease   . Stented coronary artery 01/16/2013   X 2  . Systolic murmur   . Unspecified vitamin D deficiency 01/17/2013    Past Surgical History:  Procedure Laterality Date  . CAROTID STENT  2008  . CORONARY ANGIOPLASTY  WITH STENT PLACEMENT  about 2014   current Cardiology: Dr Orene Desanctis  . EYE SURGERY      Outpatient Medications Prior to Visit  Medication Sig Dispense Refill  . ALPRAZolam (XANAX) 1 MG tablet TAKE 1/2 TO 1 TABLET BY MOUTH TWICE DAILY AS NEEDED 60 tablet 2  . amLODipine (NORVASC) 5 MG tablet Take 1 tablet (5 mg total) by mouth daily. 90 tablet 3  . aspirin 81 MG tablet Take 81 mg by mouth daily.    . carbamazepine (TEGRETOL) 200 MG tablet TAKE 1 TABLET BY MOUTH TWICE DAILY 180 tablet 1  . carvedilol (COREG) 25 MG tablet TAKE 1 TABLET BY MOUTH TWICE DAILY WITH A MEAL 180 tablet 1  . carvedilol (COREG) 25 MG tablet TAKE ONE TABLET TWICE DAILY WITH MEALS 180 tablet 0  . clobetasol cream (TEMOVATE) 0.05 % APPLY TOPICALLY TWICE DAILY AS NEEDED 60 g 1  . clopidogrel (PLAVIX) 75 MG tablet TAKE 1 TABLET BY MOUTH DAILY 90 tablet 0  . Cyanocobalamin (VITAMIN B 12 PO) Take by mouth.    . fenofibrate 54 MG tablet TAKE 1 TABLET BY MOUTH ONCE DAILY 90 tablet 3  . folic acid (FOLVITE) 1 MG tablet TAKE 1 TABLET BY MOUTH DAILY 90 tablet 0  . HYDROcodone-acetaminophen (NORCO/VICODIN) 5-325 MG tablet Take 1 tablet by mouth every 6 (six) hours as needed for moderate  pain. 60 tablet 0  . hydrOXYzine (ATARAX/VISTARIL) 25 MG tablet TAKE 1 TABLET BY MOUTH 3 TIMES DAILY AS NEEDED 270 tablet 1  . leflunomide (ARAVA) 20 MG tablet Take 1 tablet by mouth daily.    . nitroGLYCERIN (NITROSTAT) 0.4 MG SL tablet Place 1 tablet (0.4 mg total) under the tongue every 5 (five) minutes as needed for chest pain. 25 tablet 3  . Omega-3 Fatty Acids (FISH OIL PO) Take by mouth daily.    . pantoprazole (PROTONIX) 40 MG tablet TAKE 1 TABLET BY MOUTH TWICE DAILY 180 tablet 3  . predniSONE (DELTASONE) 5 MG tablet Take 2.5 mg by mouth daily with breakfast. Take one tablet daily    . ramipril (ALTACE) 2.5 MG capsule TAKE 1 CAPSULE BY MOUTH ONCE DAILY 90 capsule 3  . rosuvastatin (CRESTOR) 20 MG tablet Take 1 tablet (20 mg total) by mouth  daily. 90 tablet 3  . sildenafil (REVATIO) 20 MG tablet Take 3 pills daily as needed 60 tablet 2  . traZODone (DESYREL) 50 MG tablet TAKE 1/2 TO 1 TABLETS BY MOUTH AT BEDTIME AS NEEDED FOR SLEEP 90 tablet 5   Facility-Administered Medications Prior to Visit  Medication Dose Route Frequency Provider Last Rate Last Dose  . triamcinolone acetonide (KENALOG) 10 MG/ML injection 10 mg  10 mg Other Once Landis Martins, DPM         Allergies:   Lipitor [atorvastatin]   Social History   Social History  . Marital status: Single    Spouse name: N/A  . Number of children: N/A  . Years of education: N/A   Social History Main Topics  . Smoking status: Former Smoker    Packs/day: 1.00    Years: 20.00    Types: Cigarettes    Quit date: 02/22/2002  . Smokeless tobacco: Current User    Types: Chew  . Alcohol use Yes  . Drug use: No  . Sexual activity: Not Asked   Other Topics Concern  . None   Social History Narrative  . None     Family History:  The patient's family history includes Alzheimer's disease in his brother; Cancer in his maternal grandfather, maternal grandmother, and mother; Heart disease in his maternal grandmother, mother, and paternal grandfather; Hyperlipidemia in his maternal grandmother; Lung cancer in his father; Stroke in his maternal grandmother and paternal grandfather.   ROS:   Please see the history of present illness.    ROS All other systems reviewed and are negative.   PHYSICAL EXAM:   VS:  BP 130/72 (BP Location: Right Arm, Patient Position: Sitting, Cuff Size: Normal)   Pulse 70   Ht 5\' 11"  (1.803 m)   Wt 167 lb (75.8 kg)   SpO2 99%   BMI 23.29 kg/m    GEN: Well nourished, well developed, in no acute distress  HEENT: normal  Neck: no JVD, carotid bruits, or masses Cardiac: RRR; no murmurs, rubs, or gallops,no edema  Respiratory:  clear to auscultation bilaterally, normal work of breathing GI: soft, nontender, nondistended, + BS MS: no deformity or  atrophy  Skin: warm and dry, no rash Neuro:  Alert and Oriented x 3, Strength and sensation are intact Psych: euthymic mood, full affect  Wt Readings from Last 3 Encounters:  10/15/15 167 lb (75.8 kg)  10/08/15 165 lb 3.2 oz (74.9 kg)  03/31/15 168 lb 6 oz (76.4 kg)      Studies/Labs Reviewed:   EKG:  EKG is ordered today.  The ekg  ordered today demonstrates normal sinus rhythm with first-degree A-V block (PR 218 ms), QTC 409 ms. No signs of ischemic repolarization abnormalities  Recent Labs: 05/14/2015: ALT 17; BUN 13; Creatinine, Ser 0.74; Hemoglobin 11.3; Platelets 364.0; Potassium 4.3; Sodium 136; TSH 2.22   Lipid Panel    Component Value Date/Time   CHOL 179 05/14/2015 0903   TRIG 166.0 (H) 05/14/2015 0903   HDL 60.70 05/14/2015 0903   CHOLHDL 3 05/14/2015 0903   VLDL 33.2 05/14/2015 0903   LDLCALC 85 05/14/2015 0903   LDLDIRECT 142.5 02/27/2014 1013    ASSESSMENT:    1. CAD -S/P PCI 2008, 2010   2. Essential hypertension   3. Hyperlipidemia      PLAN:  In order of problems listed above:  1. CAD: Remote history of stents, currently asymptomatic, CCS class I. Low risk nuclear perfusion study in November 2015. No further changes made in his medication 2. HTN: good control 3. HLP:  So far he is tolerating rosuvastatin and has had a remarkable improvement in LDL, although not quite at target    Medication Adjustments/Labs and Tests Ordered: Current medicines are reviewed at length with the patient today.  Concerns regarding medicines are outlined above.  Medication changes, Labs and Tests ordered today are listed in the Patient Instructions below. Patient Instructions  Dr Sallyanne Kuster recommends that you schedule a follow-up appointment in 6 months. You will receive a reminder letter in the mail two months in advance. If you don't receive a letter, please call our office to schedule the follow-up appointment.  If you need a refill on your cardiac medications before  your next appointment, please call your pharmacy.      Signed, Sanda Klein, MD  10/15/2015 6:01 PM    [facility] [address], [place], [place]  [address] Phone: [phone]; Fax: [phone]

## 2015-10-17 ENCOUNTER — Telehealth: Payer: Self-pay

## 2015-10-17 MED ORDER — ALPRAZOLAM 1 MG PO TABS: ORAL_TABLET | ORAL | 2 refills | Status: DC

## 2015-10-17 NOTE — Telephone Encounter (Signed)
Medication refill sent to pharmacy  

## 2015-10-17 NOTE — Telephone Encounter (Signed)
Done hardcopy to Corinne  

## 2015-10-17 NOTE — Telephone Encounter (Signed)
ALPRAZolam (XANAX) 1 MG tablet   Patient is requesting a refill on this medication. Please follow up, Thank you.

## 2015-10-29 ENCOUNTER — Ambulatory Visit: Payer: Medicare Other | Admitting: Cardiovascular Disease

## 2015-11-18 ENCOUNTER — Other Ambulatory Visit: Payer: Self-pay | Admitting: Internal Medicine

## 2015-11-27 ENCOUNTER — Ambulatory Visit: Payer: Medicare Other | Admitting: Internal Medicine

## 2015-12-01 ENCOUNTER — Other Ambulatory Visit: Payer: Self-pay | Admitting: Internal Medicine

## 2015-12-02 ENCOUNTER — Ambulatory Visit (INDEPENDENT_AMBULATORY_CARE_PROVIDER_SITE_OTHER): Payer: Medicare Other | Admitting: Internal Medicine

## 2015-12-02 ENCOUNTER — Encounter: Payer: Self-pay | Admitting: Internal Medicine

## 2015-12-02 VITALS — BP 124/80 | HR 68 | Temp 98.1°F | Resp 20 | Wt 171.4 lb

## 2015-12-02 DIAGNOSIS — I10 Essential (primary) hypertension: Secondary | ICD-10-CM | POA: Diagnosis not present

## 2015-12-02 DIAGNOSIS — M79641 Pain in right hand: Secondary | ICD-10-CM

## 2015-12-02 DIAGNOSIS — E785 Hyperlipidemia, unspecified: Secondary | ICD-10-CM

## 2015-12-02 DIAGNOSIS — R739 Hyperglycemia, unspecified: Secondary | ICD-10-CM

## 2015-12-02 DIAGNOSIS — R0789 Other chest pain: Secondary | ICD-10-CM | POA: Diagnosis not present

## 2015-12-02 DIAGNOSIS — M79642 Pain in left hand: Secondary | ICD-10-CM

## 2015-12-02 DIAGNOSIS — Z0001 Encounter for general adult medical examination with abnormal findings: Secondary | ICD-10-CM | POA: Diagnosis not present

## 2015-12-02 DIAGNOSIS — Z23 Encounter for immunization: Secondary | ICD-10-CM | POA: Diagnosis not present

## 2015-12-02 MED ORDER — DICLOFENAC SODIUM 1 % TD GEL: 4.0000 g | Freq: Four times a day (QID) | TRANSDERMAL | 11 refills | Status: DC | PRN

## 2015-12-02 MED ORDER — CARBAMAZEPINE 200 MG PO TABS: 200.0000 mg | ORAL_TABLET | Freq: Two times a day (BID) | ORAL | 3 refills | Status: DC

## 2015-12-02 NOTE — Assessment & Plan Note (Signed)
stable overall by history and exam, recent data reviewed with pt, and pt to continue medical treatment as before,  to f/u any worsening symptoms or concerns BP Readings from Last 3 Encounters:  12/02/15 124/80  10/15/15 130/72  10/08/15 138/64

## 2015-12-02 NOTE — Assessment & Plan Note (Signed)
stable overall by history and exam, recent data reviewed with pt, and pt to continue medical treatment as before,  to f/u any worsening symptoms or concerns Lab Results  Component Value Date   CHOL 179 05/14/2015   HDL 60.70 05/14/2015   LDLCALC 85 05/14/2015   LDLDIRECT 142.5 02/27/2014   TRIG 166.0 (H) 05/14/2015   CHOLHDL 3 05/14/2015   Declines further lab f/u today

## 2015-12-02 NOTE — Progress Notes (Signed)
Subjective:    Patient ID: Phillip [name], male    DOB: 1945-12-09, 70 y.o.   MRN: [id number]  HPI   Here for wellness and f/u;  Overall doing ok;  Pt denies wheezing, orthopnea, PND, worsening LE edema, palpitations, dizziness or syncope.  Pt denies neurological change such as new headache, facial or extremity weakness.  Pt denies polydipsia, polyuria, or low sugar symptoms. Pt states overall good compliance with treatment and medications, good tolerability, and has been trying to follow appropriate diet.  Pt denies worsening depressive symptoms, suicidal ideation or panic. No fever, night sweats, wt loss, loss of appetite, or other constitutional symptoms.  Pt states good ability with ADL's, has low fall risk, home safety reviewed and adequate, no other significant changes in hearing or vision, and only occasionally active with exercise, has chronic ongoing pain, asks for handicap parking application.  Does c/o ongoing fatigue, but denies signficant daytime hypersomnolence. For flu shot today, declines pneuoonia shots and tetanus update.  Does have constant bilat hand pain, but now worsening RA flare/joint swelling to MCP;s.    Has fallen several times in past yr, mostlly slips and falls or missteps.   Has had 1 mo exertional (nonpositional, nonplueritic ) CP and SOB , dull burning like overexertion but only walking briskly < 100 ft, no radiation, + sob with it, but diaphoresis, n/v, palps or syncope.  Lsata stress test nov 2015 neg for ischemia.  Has no card appt pending, but supposed to f/u at 6 mo.. Declines f/u stress test this visit.  Tolerating increased amlod to 5 qd.  Declines lipid f/u today  Does seem to bruise easiser on plavix to arms, but heals well after ice tx. Needs several med refills.   Past Medical History:  Diagnosis Date  . Anemia   . Anxiety   . Arthritis   . Bipolar depression ([place])   . CAD, multiple vessel   . Chronic pain disorder    chronic bilat shoulder pain and  feet pain  . Depression   . Erectile dysfunction 01/17/2013  . GERD (gastroesophageal reflux disease)   . History of colon polyps   . Hyperlipidemia   . Hypertension   . Insomnia   . Iron deficiency anemia 01/16/2013  . Mixed hyperlipidemia 03/08/2013  . Status post insertion of drug-eluting stent into left anterior descending (LAD) artery for coronary artery disease   . Stented coronary artery 01/16/2013   X 2  . Systolic murmur   . Unspecified vitamin D deficiency 01/17/2013   Past Surgical History:  Procedure Laterality Date  . CAROTID STENT  2008  . CORONARY ANGIOPLASTY WITH STENT PLACEMENT  about 2014   current Cardiology: Dr Orene Desanctis  . EYE SURGERY      reports that he quit smoking about 13 years ago. His smoking use included Cigarettes. He has a 20.00 pack-year smoking history. His smokeless tobacco use includes Chew. He reports that he drinks alcohol. He reports that he does not use drugs. family history includes Alzheimer's disease in his brother; Cancer in his maternal grandfather, maternal grandmother, and mother; Heart disease in his maternal grandmother, mother, and paternal grandfather; Hyperlipidemia in his maternal grandmother; Lung cancer in his father; Stroke in his maternal grandmother and paternal grandfather. Allergies  Allergen Reactions  . Lipitor [Atorvastatin] Other (See Comments)    myalgia   Current Outpatient Prescriptions on File Prior to Visit  Medication Sig Dispense Refill  . ALPRAZolam (XANAX) 1 MG tablet TAKE 1/2 TO  1 TABLET BY MOUTH TWICE DAILY AS NEEDED 60 tablet 2  . amLODipine (NORVASC) 5 MG tablet Take 1 tablet (5 mg total) by mouth daily. 90 tablet 3  . aspirin 81 MG tablet Take 81 mg by mouth daily.    . carvedilol (COREG) 25 MG tablet TAKE ONE TABLET TWICE DAILY WITH MEALS 180 tablet 0  . clobetasol cream (TEMOVATE) 0.05 % APPLY TOPICALLY TWICE DAILY AS NEEDED 60 g 1  . clopidogrel (PLAVIX) 75 MG tablet TAKE 1 TABLET BY MOUTH DAILY 90  tablet 0  . Cyanocobalamin (VITAMIN B 12 PO) Take by mouth.    . fenofibrate 54 MG tablet TAKE 1 TABLET BY MOUTH ONCE DAILY 90 tablet 3  . folic acid (FOLVITE) 1 MG tablet TAKE 1 TABLET BY MOUTH DAILY 90 tablet 0  . HYDROcodone-acetaminophen (NORCO/VICODIN) 5-325 MG tablet Take 1 tablet by mouth every 6 (six) hours as needed for moderate pain. 60 tablet 0  . hydrOXYzine (ATARAX/VISTARIL) 25 MG tablet TAKE 1 TABLET BY MOUTH 3 TIMES DAILY AS NEEDED 270 tablet 1  . leflunomide (ARAVA) 20 MG tablet Take 1 tablet by mouth daily.    . nitroGLYCERIN (NITROSTAT) 0.4 MG SL tablet Place 1 tablet (0.4 mg total) under the tongue every 5 (five) minutes as needed for chest pain. 25 tablet 3  . Omega-3 Fatty Acids (FISH OIL PO) Take by mouth daily.    . pantoprazole (PROTONIX) 40 MG tablet TAKE 1 TABLET BY MOUTH TWICE DAILY 180 tablet 3  . predniSONE (DELTASONE) 5 MG tablet Take 2.5 mg by mouth daily with breakfast. Take one tablet daily    . ramipril (ALTACE) 2.5 MG capsule TAKE 1 CAPSULE BY MOUTH ONCE DAILY 90 capsule 3  . rosuvastatin (CRESTOR) 20 MG tablet Take 1 tablet (20 mg total) by mouth daily. 90 tablet 3  . sildenafil (REVATIO) 20 MG tablet Take 3 pills daily as needed 60 tablet 2  . traZODone (DESYREL) 50 MG tablet TAKE 1/2 TO 1 TABLETS BY MOUTH AT BEDTIME AS NEEDED FOR SLEEP 90 tablet 5   Current Facility-Administered Medications on File Prior to Visit  Medication Dose Route Frequency Provider Last Rate Last Dose  . triamcinolone acetonide (KENALOG) 10 MG/ML injection 10 mg  10 mg Other Once Landis Martins, DPM       Review of Systems Constitutional: Negative for increased diaphoresis, or other activity, appetite or siginficant weight change other than noted HENT: Negative for worsening hearing loss, ear pain, facial swelling, mouth sores and neck stiffness.   Eyes: Negative for other worsening pain, redness or visual disturbance.  Respiratory: Negative for choking or stridor Cardiovascular:  Negative for other chest pain and palpitations.  Gastrointestinal: Negative for worsening diarrhea, blood in stool, or abdominal distention Genitourinary: Negative for hematuria, flank pain or change in urine volume.  Musculoskeletal: Negative for myalgias or other joint complaints.  Skin: Negative for other color change and wound or drainage.  Neurological: Negative for syncope and numbness. other than noted Hematological: Negative for adenopathy. or other swelling Psychiatric/Behavioral: Negative for hallucinations, SI, self-injury, decreased concentration or other worsening agitation.      Objective:   Physical Exam BP 124/80   Pulse 68   Temp 98.1 F (36.7 C) (Oral)   Resp 20   Wt 171 lb 6 oz (77.7 kg)   SpO2 97%   BMI 23.90 kg/m  VS noted,  Constitutional: Pt is oriented to person, place, and time. Appears well-developed and well-nourished, in no significant distress Head:  Normocephalic and atraumatic  Eyes: Conjunctivae and EOM are normal. Pupils are equal, round, and reactive to light Right Ear: External ear normal.  Left Ear: External ear normal Nose: Nose normal.  Mouth/Throat: Oropharynx is clear and moist  Neck: Normal range of motion. Neck supple. No JVD present. No tracheal deviation present or significant neck LA or mass Cardiovascular: Normal rate, regular rhythm, normal heart sounds and intact distal pulses.   Pulmonary/Chest: Effort normal and breath sounds without rales or wheezing  Abdominal: Soft. Bowel sounds are normal. NT. No HSM  Musculoskeletal: Normal range of motion. Exhibits no edema Lymphadenopathy: Has no cervical adenopathy.  Neurological: Pt is alert and oriented to person, place, and time. Pt has normal reflexes. No cranial nerve deficit. Motor grossly intact Skin: Skin is warm and dry. No rash noted or new ulcers Psychiatric:  Has normal mood and affect. Behavior is normal.  Bilat hand with OA bony changes involving primarily the first MCPs    ECG I have personally reviewed today Sinus  Rhythm  Low voltage with rightward P-axis and rotation -possible pulmonary disease.  ABNORMAL   Lab Results  Component Value Date   WBC 5.8 05/14/2015   HGB 11.3 (L) 05/14/2015   HCT 33.3 (L) 05/14/2015   PLT 364.0 05/14/2015   GLUCOSE 124 (H) 05/14/2015   CHOL 179 05/14/2015   TRIG 166.0 (H) 05/14/2015   HDL 60.70 05/14/2015   LDLDIRECT 142.5 02/27/2014   LDLCALC 85 05/14/2015   ALT 17 05/14/2015   AST 16 05/14/2015   NA 136 05/14/2015   K 4.3 05/14/2015   CL 98 05/14/2015   CREATININE 0.74 05/14/2015   BUN 13 05/14/2015   CO2 31 05/14/2015   TSH 2.22 05/14/2015   PSA 0.47 05/14/2015   INR 1.0 09/15/2008   HGBA1C 6.4 05/14/2015       Assessment & Plan:

## 2015-12-02 NOTE — Assessment & Plan Note (Signed)
C/w OA, mild to mod, for volt gel for pain control,  to f/u any worsening symptoms or concerns

## 2015-12-02 NOTE — Assessment & Plan Note (Signed)

## 2015-12-02 NOTE — Progress Notes (Signed)
Pre visit review using our clinic review tool, if applicable. No additional management support is needed unless otherwise documented below in the visit note. 

## 2015-12-02 NOTE — Assessment & Plan Note (Addendum)
Exertional and somewhat typical, last stress test > 1 yr, ecg reviewed, pt adamant he does not want further stress testing for now  In addition to the time spent performing CPE, I spent an additional 25 minutes face to face,in which greater than 50% of this time was spent in counseling and coordination of care for patient's acute illness as documented.

## 2015-12-02 NOTE — Patient Instructions (Addendum)
You had the flu shot today  Your EKG was OK today  Please take all new medication as prescribed - the voltaren gel for pain  Please continue all other medications as before, and refills have been done if requested.  Please have the pharmacy call with any other refills you may need.  Please continue your efforts at being more active, low cholesterol diet, and weight control.  You are otherwise up to date with preventionmeasures today.  Please keep your appointments with your specialists as you may have planned  Your lab work done Mar 2017 was sufficient for today  Please return in 6 months, or sooner if needed, with Lab testing done 3-5 days before

## 2016-01-24 ENCOUNTER — Other Ambulatory Visit: Payer: Self-pay | Admitting: Internal Medicine

## 2016-01-29 ENCOUNTER — Telehealth: Payer: Self-pay | Admitting: *Deleted

## 2016-01-29 MED ORDER — ALPRAZOLAM 1 MG PO TABS: ORAL_TABLET | ORAL | 2 refills | Status: DC

## 2016-01-29 NOTE — Telephone Encounter (Signed)
Rec'd call pt is requesting refill on his Alprazolam.../lmb

## 2016-01-29 NOTE — Telephone Encounter (Signed)
faxed

## 2016-01-29 NOTE — Telephone Encounter (Signed)
Done hardcopy to Corinne  

## 2016-03-17 ENCOUNTER — Other Ambulatory Visit: Payer: Self-pay | Admitting: Internal Medicine

## 2016-03-30 ENCOUNTER — Other Ambulatory Visit: Payer: Self-pay | Admitting: Cardiovascular Disease

## 2016-03-30 DIAGNOSIS — E785 Hyperlipidemia, unspecified: Secondary | ICD-10-CM

## 2016-04-20 ENCOUNTER — Other Ambulatory Visit: Payer: Self-pay | Admitting: Internal Medicine

## 2016-04-26 ENCOUNTER — Other Ambulatory Visit: Payer: Self-pay | Admitting: Internal Medicine

## 2016-04-27 NOTE — Telephone Encounter (Signed)
Faxed script back to [facility].../lmb 

## 2016-04-27 NOTE — Telephone Encounter (Signed)
Done hardcopy to Anna  

## 2016-05-29 ENCOUNTER — Other Ambulatory Visit: Payer: Self-pay | Admitting: Internal Medicine

## 2016-06-01 ENCOUNTER — Encounter: Payer: Self-pay | Admitting: Internal Medicine

## 2016-06-01 ENCOUNTER — Other Ambulatory Visit (INDEPENDENT_AMBULATORY_CARE_PROVIDER_SITE_OTHER): Payer: Medicare Other

## 2016-06-01 ENCOUNTER — Ambulatory Visit (INDEPENDENT_AMBULATORY_CARE_PROVIDER_SITE_OTHER): Payer: Medicare Other | Admitting: Internal Medicine

## 2016-06-01 VITALS — BP 144/86 | HR 69 | Temp 97.6°F | Ht 70.0 in | Wt 170.0 lb

## 2016-06-01 DIAGNOSIS — R059 Cough, unspecified: Secondary | ICD-10-CM | POA: Insufficient documentation

## 2016-06-01 DIAGNOSIS — N320 Bladder-neck obstruction: Secondary | ICD-10-CM

## 2016-06-01 DIAGNOSIS — J329 Chronic sinusitis, unspecified: Secondary | ICD-10-CM | POA: Insufficient documentation

## 2016-06-01 DIAGNOSIS — R739 Hyperglycemia, unspecified: Secondary | ICD-10-CM

## 2016-06-01 DIAGNOSIS — I10 Essential (primary) hypertension: Secondary | ICD-10-CM

## 2016-06-01 DIAGNOSIS — R918 Other nonspecific abnormal finding of lung field: Secondary | ICD-10-CM | POA: Insufficient documentation

## 2016-06-01 DIAGNOSIS — R05 Cough: Secondary | ICD-10-CM | POA: Diagnosis not present

## 2016-06-01 DIAGNOSIS — R079 Chest pain, unspecified: Secondary | ICD-10-CM | POA: Insufficient documentation

## 2016-06-01 DIAGNOSIS — Z23 Encounter for immunization: Secondary | ICD-10-CM

## 2016-06-01 DIAGNOSIS — J309 Allergic rhinitis, unspecified: Secondary | ICD-10-CM | POA: Insufficient documentation

## 2016-06-01 LAB — CBC WITH DIFFERENTIAL/PLATELET
BASOS ABS: 0.1 10*3/uL (ref 0.0–0.1)
Basophils Relative: 1.1 % (ref 0.0–3.0)
EOS ABS: 0.2 10*3/uL (ref 0.0–0.7)
EOS PCT: 3.6 % (ref 0.0–5.0)
HCT: 32.1 % — ABNORMAL LOW (ref 39.0–52.0)
HEMOGLOBIN: 10.9 g/dL — AB (ref 13.0–17.0)
Lymphocytes Relative: 26 % (ref 12.0–46.0)
Lymphs Abs: 1.4 10*3/uL (ref 0.7–4.0)
MCHC: 34 g/dL (ref 30.0–36.0)
MCV: 88.5 fl (ref 78.0–100.0)
MONO ABS: 0.6 10*3/uL (ref 0.1–1.0)
Monocytes Relative: 10.7 % (ref 3.0–12.0)
NEUTROS PCT: 58.6 % (ref 43.0–77.0)
Neutro Abs: 3.2 10*3/uL (ref 1.4–7.7)
Platelets: 405 10*3/uL — ABNORMAL HIGH (ref 150.0–400.0)
RBC: 3.62 Mil/uL — AB (ref 4.22–5.81)
RDW: 13.6 % (ref 11.5–15.5)
WBC: 5.4 10*3/uL (ref 4.0–10.5)

## 2016-06-01 LAB — HEMOGLOBIN A1C: Hgb A1c MFr Bld: 6.7 % — ABNORMAL HIGH (ref 4.6–6.5)

## 2016-06-01 LAB — URINALYSIS, ROUTINE W REFLEX MICROSCOPIC
BILIRUBIN URINE: NEGATIVE
HGB URINE DIPSTICK: NEGATIVE
Ketones, ur: NEGATIVE
LEUKOCYTES UA: NEGATIVE
NITRITE: NEGATIVE
PH: 7 (ref 5.0–8.0)
RBC / HPF: NONE SEEN (ref 0–?)
Specific Gravity, Urine: <=1.005 — AB (ref 1.000–1.030)
TOTAL PROTEIN, URINE-UPE24: NEGATIVE
URINE GLUCOSE: NEGATIVE
UROBILINOGEN UA: 0.2 (ref 0.0–1.0)

## 2016-06-01 LAB — LIPID PANEL
CHOLESTEROL: 160 mg/dL (ref 0–200)
HDL: 45.3 mg/dL (ref >39.00)
LDL CALC: 83 mg/dL (ref 0–99)
NonHDL: 114.34
TRIGLYCERIDES: 155 mg/dL — AB (ref 0.0–149.0)
Total CHOL/HDL Ratio: 4
VLDL: 31 mg/dL (ref 0.0–40.0)

## 2016-06-01 LAB — PSA: PSA: 0.49 ng/mL (ref 0.10–4.00)

## 2016-06-01 LAB — HEPATIC FUNCTION PANEL
ALBUMIN: 4.3 g/dL (ref 3.5–5.2)
ALT: 17 U/L (ref 0–53)
AST: 18 U/L (ref 0–37)
Alkaline Phosphatase: 63 U/L (ref 39–117)
Bilirubin, Direct: 0.1 mg/dL (ref 0.0–0.3)
TOTAL PROTEIN: 7 g/dL (ref 6.0–8.3)
Total Bilirubin: 0.3 mg/dL (ref 0.2–1.2)

## 2016-06-01 LAB — BASIC METABOLIC PANEL
BUN: 9 mg/dL (ref 6–23)
CO2: 30 meq/L (ref 19–32)
Calcium: 9.5 mg/dL (ref 8.4–10.5)
Chloride: 97 mEq/L (ref 96–112)
Creatinine, Ser: 0.7 mg/dL (ref 0.40–1.50)
GFR: 118.12 mL/min (ref >60.00)
GLUCOSE: 140 mg/dL — AB (ref 70–99)
POTASSIUM: 4.4 meq/L (ref 3.5–5.1)
Sodium: 133 mEq/L — ABNORMAL LOW (ref 135–145)

## 2016-06-01 LAB — TSH: TSH: 1.74 u[IU]/mL (ref 0.35–4.50)

## 2016-06-01 MED ORDER — TRIAMCINOLONE ACETONIDE 55 MCG/ACT NA AERO: 2.0000 | INHALATION_SPRAY | Freq: Every day | NASAL | 12 refills | Status: AC

## 2016-06-01 NOTE — Assessment & Plan Note (Signed)
With CP  And hx of nodules, ok for chest ct without CM f/u

## 2016-06-01 NOTE — Progress Notes (Addendum)
Subjective:    Patient ID: Phillip [name], male    DOB: 06/26/1945, 71 y.o.   MRN: [id number]  HPI  Here to f/u., c/o right upper parasternal sharp and dull at times CP without radiation, n/v, diaphoresis palps, dizziness, sob or syncope.  Ongoing mild intermittent for 2 wks.  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  Last stress test neg for ischemia nov 2015.  Does have several wks ongoing nasal allergy symptoms with clearish congestion, itch and sneezing, without fever, pain, ST, cough, swelling or wheezing.   Declines stress test or referral back to Dr C at this time. Did not have labs done last visit butr willing to do now, as his sister now has stage 4 lung cancer.  Due for Prevnar as well Past Medical History:  Diagnosis Date  . Anemia   . Anxiety   . Arthritis   . Bipolar depression ([place])   . CAD, multiple vessel   . Chronic pain disorder    chronic bilat shoulder pain and feet pain  . Depression   . Erectile dysfunction 01/17/2013  . GERD (gastroesophageal reflux disease)   . History of colon polyps   . Hyperlipidemia   . Hypertension   . Insomnia   . Iron deficiency anemia 01/16/2013  . Mixed hyperlipidemia 03/08/2013  . Status post insertion of drug-eluting stent into left anterior descending (LAD) artery for coronary artery disease   . Stented coronary artery 01/16/2013   X 2  . Systolic murmur   . Unspecified vitamin D deficiency 01/17/2013   Past Surgical History:  Procedure Laterality Date  . CAROTID STENT  2008  . CORONARY ANGIOPLASTY WITH STENT PLACEMENT  about 2014   current Cardiology: Dr Orene Desanctis  . EYE SURGERY      reports that he quit smoking about 14 years ago. His smoking use included Cigarettes. He has a 20.00 pack-year smoking history. His smokeless tobacco use includes Chew. He reports that he drinks alcohol. He reports that he does not use drugs. family history includes Alzheimer's disease in his brother; Cancer in his  maternal grandfather, maternal grandmother, and mother; Heart disease in his maternal grandmother, mother, and paternal grandfather; Hyperlipidemia in his maternal grandmother; Lung cancer in his father; Stroke in his maternal grandmother and paternal grandfather. Allergies  Allergen Reactions  . Lipitor [Atorvastatin] Other (See Comments)    myalgia   Current Outpatient Prescriptions on File Prior to Visit  Medication Sig Dispense Refill  . ALPRAZolam (XANAX) 1 MG tablet TAKE 1/2-1 TABLET BY MOUTH TWICE DAILY AS NEEDED 60 tablet 2  . amLODipine (NORVASC) 2.5 MG tablet TAKE 1 TABLET BY MOUTH DAILY 90 tablet 2  . amLODipine (NORVASC) 5 MG tablet Take 1 tablet (5 mg total) by mouth daily. 90 tablet 3  . aspirin 81 MG tablet Take 81 mg by mouth daily.    . carbamazepine (TEGRETOL) 200 MG tablet Take 1 tablet (200 mg total) by mouth 2 (two) times daily. 180 tablet 3  . carvedilol (COREG) 25 MG tablet TAKE 1 TABLET BY MOUTH TWICE DAILY WITH MEALS 180 tablet 0  . clobetasol cream (TEMOVATE) 0.05 % APPLY TOPICALLY TWICE DAILY AS NEEDED 60 g 1  . clopidogrel (PLAVIX) 75 MG tablet TAKE 1 TABLET BY MOUTH DAILY 90 tablet 0  . Cyanocobalamin (VITAMIN B 12 PO) Take by mouth.    . diclofenac sodium (VOLTAREN) 1 % GEL Apply 4 g topically 4 (four) times  daily as needed. 400 g 11  . fenofibrate 54 MG tablet TAKE 1 TABLET BY MOUTH ONCE DAILY 90 tablet 2  . folic acid (FOLVITE) 1 MG tablet TAKE 1 TABLET BY MOUTH DAILY 90 tablet 0  . HYDROcodone-acetaminophen (NORCO/VICODIN) 5-325 MG tablet Take 1 tablet by mouth every 6 (six) hours as needed for moderate pain. 60 tablet 0  . hydrOXYzine (ATARAX/VISTARIL) 25 MG tablet TAKE 1 TABLET BY MOUTH 3 TIMES DAILY AS NEEDED 270 tablet 1  . leflunomide (ARAVA) 20 MG tablet Take 1 tablet by mouth daily.    . nitroGLYCERIN (NITROSTAT) 0.4 MG SL tablet Place 1 tablet (0.4 mg total) under the tongue every 5 (five) minutes as needed for chest pain. 25 tablet 3  . Omega-3 Fatty  Acids (FISH OIL PO) Take by mouth daily.    . pantoprazole (PROTONIX) 40 MG tablet TAKE 1 TABLET BY MOUTH TWICE DAILY 180 tablet 2  . ramipril (ALTACE) 2.5 MG capsule TAKE 1 CAPSULE BY MOUTH ONCE DAILY 90 capsule 2  . rosuvastatin (CRESTOR) 20 MG tablet TAKE 1 TABLET BY MOUTH DAILY 90 tablet 1  . sildenafil (REVATIO) 20 MG tablet Take 3 pills daily as needed 60 tablet 2  . traZODone (DESYREL) 50 MG tablet TAKE 1/2 TO 1 TABLETS BY MOUTH AT BEDTIME AS NEEDED FOR SLEEP 90 tablet 5   Current Facility-Administered Medications on File Prior to Visit  Medication Dose Route Frequency Provider Last Rate Last Dose  . triamcinolone acetonide (KENALOG) 10 MG/ML injection 10 mg  10 mg Other Once Landis Martins, DPM       Review of Systems  Constitutional: Negative for other unusual diaphoresis or sweats HENT: Negative for ear discharge or swelling Eyes: Negative for other worsening visual disturbances Respiratory: Negative for stridor or other swelling  Gastrointestinal: Negative for worsening distension or other blood Genitourinary: Negative for retention or other urinary change Musculoskeletal: Negative for other MSK pain or swelling Skin: Negative for color change or other new lesions Neurological: Negative for worsening tremors and other numbness  Psychiatric/Behavioral: Negative for worsening agitation or other fatigue All other system neg per pt    Objective:   Physical Exam BP (!) 144/86   Pulse 69   Temp 97.6 F (36.4 C) (Oral)   Ht 5\' 10"  (1.778 m)   Wt 170 lb (77.1 kg)   SpO2 99%   BMI 24.39 kg/m  VS noted, mild ill  Constitutional: Pt appears in NAD HENT: Head: NCAT.  Right Ear: External ear normal.  Left Ear: External ear normal.  Bilat tm's with mild erythema.  Max sinus areas non tender.  Pharynx with mild erythema, no exudate Eyes: . Pupils are equal, round, and reactive to light. Conjunctivae and EOM are normal Nose: without d/c or deformity Neck: Neck supple. Gross  normal ROM Cardiovascular: Normal rate and regular rhythm.   Pulmonary/Chest: Effort normal and breath sounds without rales or wheezing.  Abd:  Soft, NT, ND, + BS, no organomegaly Neurological: Pt is alert. At baseline orientation, motor grossly intact Skin: Skin is warm. No rashes, other new lesions, no LE edema Psychiatric: Pt behavior is normal without agitation but 2+ nervous  ECG today I have personally interpreted: Sinus  Bradycardia  - WNL -  no change c/w ischemia     Assessment & Plan:

## 2016-06-01 NOTE — Assessment & Plan Note (Signed)
Atypical, ecg reviewed with pt, for CT chest as ordered for pulm nodules, but declines stress test or cards f/u

## 2016-06-01 NOTE — Assessment & Plan Note (Signed)
For nasacort asd,  to f/u any worsening symptoms or concerns °

## 2016-06-01 NOTE — Assessment & Plan Note (Signed)
miild elevated likely situational, ok to follow, o/w stable overall by history and exam, recent data reviewed with pt, and pt to continue medical treatment as before,  to f/u any worsening symptoms or concerns BP Readings from Last 3 Encounters:  06/01/16 (!) 144/86  12/02/15 124/80  10/15/15 130/72

## 2016-06-01 NOTE — Patient Instructions (Signed)
Your EKG was OK today  You had the Prevnar 13 pneumonia shot today  Please take all new medication as prescribed - the nasacort for allergies and post nasal gtt  Please continue all other medications as before, and refills have been done if requested.  Please have the pharmacy call with any other refills you may need.  Please continue your efforts at being more active, low cholesterol diet, and weight control.  Please keep your appointments with your specialists as you may have planned  You will be contacted regarding the referral for: CT Chest  Please go to the LAB in the Basement (turn left off the elevator) for the tests to be done today  You will be contacted by phone if any changes need to be made immediately.  Otherwise, you will receive a letter about your results with an explanation, but please check with MyChart first.  Please remember to sign up for MyChart if you have not done so, as this will be important to you in the future with finding out test results, communicating by private email, and scheduling acute appointments online when needed.  Please return in 6 months, or sooner if needed

## 2016-06-01 NOTE — Assessment & Plan Note (Signed)
Suspect related to allergic post nasal gtt, for nasacort asd,  to f/u any worsening symptoms or concerns

## 2016-06-01 NOTE — Progress Notes (Signed)
Pre visit review using our clinic review tool, if applicable. No additional management support is needed unless otherwise documented below in the visit note. 

## 2016-06-01 NOTE — Assessment & Plan Note (Signed)
For psa as he is also due

## 2016-06-01 NOTE — Assessment & Plan Note (Signed)
Mild, for f/u lab today, wt control,  to f/u any worsening symptoms or concerns

## 2016-06-07 ENCOUNTER — Other Ambulatory Visit: Payer: Self-pay | Admitting: Internal Medicine

## 2016-06-10 ENCOUNTER — Other Ambulatory Visit: Payer: Medicare Other

## 2016-06-14 ENCOUNTER — Ambulatory Visit (INDEPENDENT_AMBULATORY_CARE_PROVIDER_SITE_OTHER)
Admission: RE | Admit: 2016-06-14 | Discharge: 2016-06-14 | Disposition: A | Discharge status: Home / Self Care | Payer: Medicare Other | Source: Ambulatory Visit | Attending: Internal Medicine | Admitting: Internal Medicine

## 2016-06-14 DIAGNOSIS — R05 Cough: Secondary | ICD-10-CM

## 2016-06-14 DIAGNOSIS — R918 Other nonspecific abnormal finding of lung field: Secondary | ICD-10-CM

## 2016-06-14 DIAGNOSIS — R059 Cough, unspecified: Secondary | ICD-10-CM

## 2016-06-14 DIAGNOSIS — R079 Chest pain, unspecified: Secondary | ICD-10-CM | POA: Diagnosis not present

## 2016-06-15 ENCOUNTER — Encounter: Payer: Self-pay | Admitting: Internal Medicine

## 2016-07-05 DIAGNOSIS — H524 Presbyopia: Secondary | ICD-10-CM | POA: Diagnosis not present

## 2016-07-12 ENCOUNTER — Other Ambulatory Visit: Payer: Self-pay | Admitting: Internal Medicine

## 2016-08-02 ENCOUNTER — Other Ambulatory Visit: Payer: Self-pay | Admitting: Internal Medicine

## 2016-08-19 ENCOUNTER — Other Ambulatory Visit: Payer: Self-pay

## 2016-08-19 MED ORDER — ALPRAZOLAM 1 MG PO TABS: ORAL_TABLET | ORAL | 2 refills | Status: DC

## 2016-08-19 NOTE — Telephone Encounter (Signed)
Done hardcopy to Shirron  

## 2016-08-19 NOTE — Telephone Encounter (Signed)
faxed

## 2016-10-04 ENCOUNTER — Other Ambulatory Visit: Payer: Self-pay | Admitting: Cardiovascular Disease

## 2016-10-04 DIAGNOSIS — E785 Hyperlipidemia, unspecified: Secondary | ICD-10-CM

## 2016-11-11 ENCOUNTER — Encounter: Payer: Self-pay | Admitting: Internal Medicine

## 2016-11-11 ENCOUNTER — Ambulatory Visit (INDEPENDENT_AMBULATORY_CARE_PROVIDER_SITE_OTHER): Payer: Medicare Other | Admitting: Internal Medicine

## 2016-11-11 VITALS — BP 116/74 | HR 61 | Temp 89.6°F | Ht 70.0 in | Wt 170.0 lb

## 2016-11-11 DIAGNOSIS — E785 Hyperlipidemia, unspecified: Secondary | ICD-10-CM

## 2016-11-11 DIAGNOSIS — M069 Rheumatoid arthritis, unspecified: Secondary | ICD-10-CM | POA: Diagnosis not present

## 2016-11-11 DIAGNOSIS — I10 Essential (primary) hypertension: Secondary | ICD-10-CM

## 2016-11-11 DIAGNOSIS — Z23 Encounter for immunization: Secondary | ICD-10-CM

## 2016-11-11 DIAGNOSIS — R739 Hyperglycemia, unspecified: Secondary | ICD-10-CM

## 2016-11-11 DIAGNOSIS — Z0001 Encounter for general adult medical examination with abnormal findings: Secondary | ICD-10-CM

## 2016-11-11 MED ORDER — HYDROCODONE-ACETAMINOPHEN 5-325 MG PO TABS: 1.0000 | ORAL_TABLET | Freq: Four times a day (QID) | ORAL | 0 refills | Status: DC | PRN

## 2016-11-11 MED ORDER — METHYLPREDNISOLONE ACETATE 80 MG/ML IJ SUSP
80.0000 mg | Freq: Once | INTRAMUSCULAR | Status: AC
  Administered 2016-11-11: 80 mg via INTRAMUSCULAR

## 2016-11-11 MED ORDER — PREDNISONE 10 MG PO TABS: ORAL_TABLET | ORAL | 0 refills | Status: DC

## 2016-11-11 NOTE — Assessment & Plan Note (Signed)
stable overall by history and exam, recent data reviewed with pt, and pt to continue medical treatment as before,  to f/u any worsening symptoms or concerns BP Readings from Last 3 Encounters:  11/11/16 116/74  06/01/16 (!) 144/86  12/02/15 124/80

## 2016-11-11 NOTE — Assessment & Plan Note (Signed)

## 2016-11-11 NOTE — Assessment & Plan Note (Signed)
Lab Results  Component Value Date   LDLCALC 83 06/01/2016  stable overall by history and exam, recent data reviewed with pt, and pt to continue medical treatment as before,  to f/u any worsening symptoms or concerns

## 2016-11-11 NOTE — Assessment & Plan Note (Signed)
Asympt, for a1c with labs,   stable overall by history and exam, recent data reviewed with pt, and pt to continue medical treatment as before,  to f/u any worsening symptoms or concerns  Lab Results  Component Value Date   HGBA1C 6.7 (H) 06/01/2016

## 2016-11-11 NOTE — Patient Instructions (Addendum)
You had the flu shot today, and steroid shot  Please take all new medication as prescribed - the prednisone  Please continue all other medications as before, and refills have been done if requested - the hydrocodone  Please have the pharmacy call with any other refills you may need.  Please continue your efforts at being more active, low cholesterol diet, and weight control.  You are otherwise up to date with prevention measures today.  Please keep your appointments with your specialists as you may have planned  You will be contacted regarding the referral for: Rheumatology  We can hold off on lab work today  Please return in 6 months, or sooner if needed, with Lab testing done 3-5 days before

## 2016-11-11 NOTE — Assessment & Plan Note (Addendum)
With flare today, for depomedrol IM 80, predpac asd, vicodin prn (limited rx), and refer rheumatology for f/u  In addition to the time spent performing CPE, I spent an additional 15 minutes face to face,in which greater than 50% of this time was spent in counseling and coordination of care for patient's illness as documented, including the differential dx, tx, further evaluation and other management of RA, hyperglycemia, HTN, and HLD

## 2016-11-11 NOTE — Progress Notes (Signed)
Subjective:    Patient ID: Phillip [name], male    DOB: November 23, 1945, 71 y.o.   MRN: [id number]  HPI   Here for wellness and f/u;  Overall doing ok;  Pt denies Chest pain, worsening SOB, DOE, wheezing, orthopnea, PND, worsening LE edema, palpitations, dizziness or syncope.  Pt denies neurological change such as new headache, facial or extremity weakness.  Pt denies polydipsia, polyuria, or low sugar symptoms. Pt states overall good compliance with treatment and medications, good tolerability, and has been trying to follow appropriate diet.  Pt denies worsening depressive symptoms, suicidal ideation or panic. No fever, night sweats, wt loss, loss of appetite, or other constitutional symptoms.  Pt states good ability with ADL's, has low fall risk, home safety reviewed and adequate, no other significant changes in hearing or vision, and not active with exercise. 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  Has not seen rheum after dissatisfied after last visit.  Has now severe pain overall, and assoc with significant fatigue and early am stiffness. No fever, trauma, falls.  Past Medical History:  Diagnosis Date  . Anemia   . Anxiety   . Arthritis   . Bipolar depression ([place])   . CAD, multiple vessel   . Chronic pain disorder    chronic bilat shoulder pain and feet pain  . Depression   . Erectile dysfunction 01/17/2013  . GERD (gastroesophageal reflux disease)   . History of colon polyps   . Hyperlipidemia   . Hypertension   . Insomnia   . Iron deficiency anemia 01/16/2013  . Mixed hyperlipidemia 03/08/2013  . Status post insertion of drug-eluting stent into left anterior descending (LAD) artery for coronary artery disease   . Stented coronary artery 01/16/2013   X 2  . Systolic murmur   . Unspecified vitamin D deficiency 01/17/2013   Past Surgical History:  Procedure Laterality Date    . CAROTID STENT  2008  . CORONARY ANGIOPLASTY WITH STENT PLACEMENT  about 2014   current Cardiology: Dr Orene Desanctis  . EYE SURGERY      reports that he quit smoking about 14 years ago. His smoking use included Cigarettes. He has a 20.00 pack-year smoking history. His smokeless tobacco use includes Chew. He reports that he drinks alcohol. He reports that he does not use drugs. family history includes Alzheimer's disease in his brother; Cancer in his maternal grandfather, maternal grandmother, and mother; Heart disease in his maternal grandmother, mother, and paternal grandfather; Hyperlipidemia in his maternal grandmother; Lung cancer in his father; Stroke in his maternal grandmother and paternal grandfather. Allergies  Allergen Reactions  . Lipitor [Atorvastatin] Other (See Comments)    myalgia   Current Outpatient Prescriptions on File Prior to Visit  Medication Sig Dispense Refill  . ALPRAZolam (XANAX) 1 MG tablet TAKE 1/2-1 TABLET BY MOUTH TWICE DAILY AS NEEDED 60 tablet 2  . amLODipine (NORVASC) 2.5 MG tablet TAKE 1 TABLET BY MOUTH DAILY 90 tablet 2  . aspirin 81 MG tablet Take 81 mg by mouth daily.    . carbamazepine (TEGRETOL) 200 MG tablet Take 1 tablet (200 mg total) by mouth 2 (two) times daily. 180 tablet 3  . carvedilol (COREG) 25 MG tablet TAKE 1 TABLET BY MOUTH TWICE DAILY WITH MEALS 180 tablet 1  . clobetasol cream (TEMOVATE) 0.05 % APPLY TOPICALLY TWICE DAILY AS NEEDED 60 g 1  . clopidogrel (PLAVIX)  75 MG tablet TAKE 1 TABLET BY MOUTH DAILY 90 tablet 0  . Cyanocobalamin (VITAMIN B 12 PO) Take by mouth.    . fenofibrate 54 MG tablet TAKE 1 TABLET BY MOUTH ONCE DAILY 90 tablet 2  . folic acid (FOLVITE) 1 MG tablet TAKE 1 TABLET BY MOUTH DAILY 90 tablet 0  . hydrOXYzine (ATARAX/VISTARIL) 25 MG tablet TAKE ONE TABLET BY MOUTH 3 TIMES A DAY AS NEEDED 270 tablet 0  . leflunomide (ARAVA) 20 MG tablet Take 1 tablet by mouth daily.    . nitroGLYCERIN (NITROSTAT) 0.4 MG SL tablet Place  1 tablet (0.4 mg total) under the tongue every 5 (five) minutes as needed for chest pain. 25 tablet 3  . Omega-3 Fatty Acids (FISH OIL PO) Take by mouth daily.    . pantoprazole (PROTONIX) 40 MG tablet TAKE 1 TABLET BY MOUTH TWICE DAILY 180 tablet 2  . ramipril (ALTACE) 2.5 MG capsule TAKE 1 CAPSULE BY MOUTH ONCE DAILY 90 capsule 2  . rosuvastatin (CRESTOR) 20 MG tablet TAKE 1 TABLET BY MOUTH DAILY 90 tablet 1  . sildenafil (REVATIO) 20 MG tablet Take 3 pills daily as needed 60 tablet 2  . traZODone (DESYREL) 50 MG tablet TAKE 1/2 TO 1 TABLETS BY MOUTH AT BEDTIME AS NEEDED FOR SLEEP 90 tablet 5  . traZODone (DESYREL) 50 MG tablet TAKE 1/2 TO 1 TABLET BY MOUTH EVERY NIGHT AT BEDTIME FOR SLEEP 90 tablet 1  . triamcinolone (NASACORT AQ) 55 MCG/ACT AERO nasal inhaler Place 2 sprays into the nose daily. 1 Inhaler 12   Current Facility-Administered Medications on File Prior to Visit  Medication Dose Route Frequency Provider Last Rate Last Dose  . triamcinolone acetonide (KENALOG) 10 MG/ML injection 10 mg  10 mg Other Once Landis Martins, DPM       Review of Systems  VS noted,  Constitutional: Pt is oriented to person, place, and time. Appears well-developed and well-nourished, in no significant distress and comfortable Head: Normocephalic and atraumatic  Eyes: Conjunctivae and EOM are normal. Pupils are equal, round, and reactive to light Right Ear: External ear normal without discharge Left Ear: External ear normal without discharge Nose: Nose without discharge or deformity Mouth/Throat: Oropharynx is without other ulcerations and moist  Neck: Normal range of motion. Neck supple. No JVD present. No tracheal deviation present or significant neck LA or mass Cardiovascular: Normal rate, regular rhythm, normal heart sounds and intact distal pulses.   Pulmonary/Chest: WOB normal and breath sounds without rales or wheezing  Abdominal: Soft. Bowel sounds are normal. NT. No HSM  Musculoskeletal:  Normal range of motion. Exhibits no edema Lymphadenopathy: Has no other cervical adenopathy.  Neurological: Pt is alert and oriented to person, place, and time. Pt has normal reflexes. No cranial nerve deficit. Motor grossly intact, Gait intact Skin: Skin is warm and dry. No rash noted or new ulcerations Psychiatric:  Has normal mood and affect. Behavior is normal without agitation All other system neg per pt     Objective:   Physical Exam BP 116/74   Pulse 61   Temp (!) 89.6 F (32 C) (Oral)   Ht 5\' 10"  (1.778 m)   Wt 170 lb (77.1 kg)   SpO2 100%   BMI 24.39 kg/m  VS noted,  Constitutional: Pt is oriented to person, place, and time. Appears well-developed and well-nourished, in no significant distress and comfortable Head: Normocephalic and atraumatic  Eyes: Conjunctivae and EOM are normal. Pupils are equal, round, and reactive to  light Right Ear: External ear normal without discharge Left Ear: External ear normal without discharge Nose: Nose without discharge or deformity Mouth/Throat: Oropharynx is without other ulcerations and moist  Neck: Normal range of motion. Neck supple. No JVD present. No tracheal deviation present or significant neck LA or mass Cardiovascular: Normal rate, regular rhythm, normal heart sounds and intact distal pulses.   Pulmonary/Chest: WOB normal and breath sounds without rales or wheezing  Abdominal: Soft. Bowel sounds are normal. NT. No HSM  Musculoskeletal: Normal range of motion. Exhibits no edema but has symmetric polyarthritis to hands, knees and feet Lymphadenopathy: Has no other cervical adenopathy.  Neurological: Pt is alert and oriented to person, place, and time. Pt has normal reflexes. No cranial nerve deficit. Motor grossly intact, Gait intact Skin: Skin is warm and dry. No rash noted or new ulcerations Psychiatric:  Has normal mood and affect. Behavior is normal without agitation No other exam findings  Lab Results  Component Value Date    WBC 5.4 06/01/2016   HGB 10.9 (L) 06/01/2016   HCT 32.1 (L) 06/01/2016   PLT 405.0 (H) 06/01/2016   GLUCOSE 140 (H) 06/01/2016   CHOL 160 06/01/2016   TRIG 155.0 (H) 06/01/2016   HDL 45.30 06/01/2016   LDLDIRECT 142.5 02/27/2014   LDLCALC 83 06/01/2016   ALT 17 06/01/2016   AST 18 06/01/2016   NA 133 (L) 06/01/2016   K 4.4 06/01/2016   CL 97 06/01/2016   CREATININE 0.70 06/01/2016   BUN 9 06/01/2016   CO2 30 06/01/2016   TSH 1.74 06/01/2016   PSA 0.49 06/01/2016   INR 1.0 09/15/2008   HGBA1C 6.7 (H) 06/01/2016        Assessment & Plan:

## 2016-11-19 ENCOUNTER — Other Ambulatory Visit: Payer: Self-pay | Admitting: Internal Medicine

## 2016-11-22 NOTE — Telephone Encounter (Signed)
Faxed

## 2016-11-22 NOTE — Telephone Encounter (Signed)
Done hardcopy to Shirron  

## 2016-12-01 ENCOUNTER — Ambulatory Visit: Payer: Medicare Other | Admitting: Internal Medicine

## 2016-12-06 ENCOUNTER — Encounter: Payer: Self-pay | Admitting: Internal Medicine

## 2016-12-06 ENCOUNTER — Ambulatory Visit (INDEPENDENT_AMBULATORY_CARE_PROVIDER_SITE_OTHER): Payer: Medicare Other | Admitting: Internal Medicine

## 2016-12-06 VITALS — BP 132/76 | HR 64 | Temp 98.1°F | Ht 70.0 in | Wt 167.0 lb

## 2016-12-06 DIAGNOSIS — R739 Hyperglycemia, unspecified: Secondary | ICD-10-CM

## 2016-12-06 DIAGNOSIS — I10 Essential (primary) hypertension: Secondary | ICD-10-CM | POA: Diagnosis not present

## 2016-12-06 DIAGNOSIS — M069 Rheumatoid arthritis, unspecified: Secondary | ICD-10-CM | POA: Diagnosis not present

## 2016-12-06 MED ORDER — PREDNISONE 10 MG PO TABS: ORAL_TABLET | ORAL | 0 refills | Status: DC

## 2016-12-06 NOTE — Assessment & Plan Note (Signed)
stable overall by history and exam, recent data reviewed with pt, and pt to continue medical treatment as before,  to f/u any worsening symptoms or concerns BP Readings from Last 3 Encounters:  12/06/16 132/76  11/11/16 116/74  06/01/16 (!) 144/86

## 2016-12-06 NOTE — Assessment & Plan Note (Signed)
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

## 2016-12-06 NOTE — Patient Instructions (Addendum)
Please take all new medication as prescribed - the prednisone  Please continue all other medications as before, and refills have been done if requested.  Please have the pharmacy call with any other refills you may need.  Please continue your efforts at being more active, low cholesterol diabetic diet, and weight control.  Please keep your appointments with your specialists as you may have planned  Please return in 5 months, or sooner if needed, with Lab testing done 3-5 days before

## 2016-12-06 NOTE — Assessment & Plan Note (Signed)
Uncontrolled, mod pain, abnormal exam, has appt with rheumatology next mo, ok for predpac repeat,  to f/u any worsening symptoms or concerns

## 2016-12-06 NOTE — Progress Notes (Signed)
Subjective:    Patient ID: Phillip [name], male    DOB: 03/01/1945, 71 y.o.   MRN: [id number]  HPI    Here to f/u with c/o uncontrolled pain to hands and wrists without fever, trauma but with hx of RA.  Has been doing well for a time after recent predpac, now recurred.  Pt denies chest pain, increased sob or doe, wheezing, orthopnea, PND, increased LE swelling, palpitations, dizziness or syncope.  Pt denies new neurological symptoms such as new headache, or facial or extremity weakness or numbness   Pt denies polydipsia, polyuria,  Wt Readings from Last 3 Encounters:  12/06/16 167 lb (75.8 kg)  11/11/16 170 lb (77.1 kg)  06/01/16 170 lb (77.1 kg)   BP Readings from Last 3 Encounters:  12/06/16 132/76  11/11/16 116/74  06/01/16 (!) 144/86  Has appt with rheum next month. Past Medical History:  Diagnosis Date  . Anemia   . Anxiety   . Arthritis   . Bipolar depression ([place])   . CAD, multiple vessel   . Chronic pain disorder    chronic bilat shoulder pain and feet pain  . Depression   . Erectile dysfunction 01/17/2013  . GERD (gastroesophageal reflux disease)   . History of colon polyps   . Hyperlipidemia   . Hypertension   . Insomnia   . Iron deficiency anemia 01/16/2013  . Mixed hyperlipidemia 03/08/2013  . Status post insertion of drug-eluting stent into left anterior descending (LAD) artery for coronary artery disease   . Stented coronary artery 01/16/2013   X 2  . Systolic murmur   . Unspecified vitamin D deficiency 01/17/2013   Past Surgical History:  Procedure Laterality Date  . CAROTID STENT  2008  . CORONARY ANGIOPLASTY WITH STENT PLACEMENT  about 2014   current Cardiology: Dr Orene Desanctis  . EYE SURGERY      reports that he quit smoking about 14 years ago. His smoking use included Cigarettes. He has a 20.00 pack-year smoking history. His smokeless tobacco use includes Chew. He reports that he drinks alcohol. He reports that he does not use drugs. family history  includes Alzheimer's disease in his brother; Cancer in his maternal grandfather, maternal grandmother, and mother; Heart disease in his maternal grandmother, mother, and paternal grandfather; Hyperlipidemia in his maternal grandmother; Lung cancer in his father; Stroke in his maternal grandmother and paternal grandfather. Allergies  Allergen Reactions  . Lipitor [Atorvastatin] Other (See Comments)    myalgia   Current Outpatient Prescriptions on File Prior to Visit  Medication Sig Dispense Refill  . ALPRAZolam (XANAX) 1 MG tablet TAKE 1/2 TO 1 TABLET BY MOUTH TWICE DAILY AS NEEDED 60 tablet 2  . amLODipine (NORVASC) 2.5 MG tablet TAKE 1 TABLET BY MOUTH DAILY 90 tablet 2  . aspirin 81 MG tablet Take 81 mg by mouth daily.    . carbamazepine (TEGRETOL) 200 MG tablet Take 1 tablet (200 mg total) by mouth 2 (two) times daily. 180 tablet 3  . carvedilol (COREG) 25 MG tablet TAKE 1 TABLET BY MOUTH TWICE DAILY WITH MEALS 180 tablet 1  . clobetasol cream (TEMOVATE) 0.05 % APPLY TOPICALLY TWICE DAILY AS NEEDED 60 g 1  . clopidogrel (PLAVIX) 75 MG tablet TAKE 1 TABLET BY MOUTH DAILY 90 tablet 0  . Cyanocobalamin (VITAMIN B 12 PO) Take by mouth.    . fenofibrate 54 MG tablet TAKE 1 TABLET BY MOUTH ONCE DAILY 90 tablet 2  . folic acid (FOLVITE) 1 MG tablet  TAKE 1 TABLET BY MOUTH DAILY 90 tablet 0  . HYDROcodone-acetaminophen (NORCO/VICODIN) 5-325 MG tablet Take 1 tablet by mouth every 6 (six) hours as needed for moderate pain. 60 tablet 0  . hydrOXYzine (ATARAX/VISTARIL) 25 MG tablet TAKE ONE TABLET BY MOUTH 3 TIMES A DAY AS NEEDED 270 tablet 0  . leflunomide (ARAVA) 20 MG tablet Take 1 tablet by mouth daily.    . nitroGLYCERIN (NITROSTAT) 0.4 MG SL tablet Place 1 tablet (0.4 mg total) under the tongue every 5 (five) minutes as needed for chest pain. 25 tablet 3  . Omega-3 Fatty Acids (FISH OIL PO) Take by mouth daily.    . pantoprazole (PROTONIX) 40 MG tablet TAKE 1 TABLET BY MOUTH TWICE DAILY 180 tablet  2  . ramipril (ALTACE) 2.5 MG capsule TAKE 1 CAPSULE BY MOUTH ONCE DAILY 90 capsule 2  . rosuvastatin (CRESTOR) 20 MG tablet TAKE 1 TABLET BY MOUTH DAILY 90 tablet 1  . sildenafil (REVATIO) 20 MG tablet Take 3 pills daily as needed 60 tablet 2  . traZODone (DESYREL) 50 MG tablet TAKE 1/2 TO 1 TABLETS BY MOUTH AT BEDTIME AS NEEDED FOR SLEEP 90 tablet 5  . traZODone (DESYREL) 50 MG tablet TAKE 1/2 TO 1 TABLET BY MOUTH EVERY NIGHT AT BEDTIME FOR SLEEP 90 tablet 1  . triamcinolone (NASACORT AQ) 55 MCG/ACT AERO nasal inhaler Place 2 sprays into the nose daily. 1 Inhaler 12   Current Facility-Administered Medications on File Prior to Visit  Medication Dose Route Frequency Provider Last Rate Last Dose  . triamcinolone acetonide (KENALOG) 10 MG/ML injection 10 mg  10 mg Other Once Landis Martins, DPM        Review of Systems  Constitutional: Negative for other unusual diaphoresis or sweats HENT: Negative for ear discharge or swelling Eyes: Negative for other worsening visual disturbances Respiratory: Negative for stridor or other swelling  Gastrointestinal: Negative for worsening distension or other blood Genitourinary: Negative for retention or other urinary change Musculoskeletal: Negative for other MSK pain or swelling Skin: Negative for color change or other new lesions Neurological: Negative for worsening tremors and other numbness  Psychiatric/Behavioral: Negative for worsening agitation or other fatigue All other system neg per pt    Objective:   Physical Exam BP 132/76   Pulse 64   Temp 98.1 F (36.7 C) (Oral)   Ht 5\' 10"  (1.778 m)   Wt 167 lb (75.8 kg)   SpO2 99%   BMI 23.96 kg/m  VS noted, not ill appaering Constitutional: Pt appears in NAD HENT: Head: NCAT.  Right Ear: External ear normal.  Left Ear: External ear normal.  Eyes: . Pupils are equal, round, and reactive to light. Conjunctivae and EOM are normal Nose: without d/c or deformity Neck: Neck supple. Gross  normal ROM Cardiovascular: Normal rate and regular rhythm.   Pulmonary/Chest: Effort normal and breath sounds without rales or wheezing.  Abd:  Soft, NT, ND, + BS, no organomegaly MSK: bilat symmetric MCP and wrists with 1+ effusion, tender, reduced ROM Neurological: Pt is alert. At baseline orientation, motor grossly intact Skin: Skin is warm. No rashes, other new lesions, no LE edema Psychiatric: Pt behavior is normal without agitation  No other exam findings Lab Results  Component Value Date   WBC 5.4 06/01/2016   HGB 10.9 (L) 06/01/2016   HCT 32.1 (L) 06/01/2016   PLT 405.0 (H) 06/01/2016   GLUCOSE 140 (H) 06/01/2016   CHOL 160 06/01/2016   TRIG 155.0 (H) 06/01/2016  HDL 45.30 06/01/2016   LDLDIRECT 142.5 02/27/2014   LDLCALC 83 06/01/2016   ALT 17 06/01/2016   AST 18 06/01/2016   NA 133 (L) 06/01/2016   K 4.4 06/01/2016   CL 97 06/01/2016   CREATININE 0.70 06/01/2016   BUN 9 06/01/2016   CO2 30 06/01/2016   TSH 1.74 06/01/2016   PSA 0.49 06/01/2016   INR 1.0 09/15/2008   HGBA1C 6.7 (H) 06/01/2016         Assessment & Plan:

## 2016-12-20 ENCOUNTER — Other Ambulatory Visit: Payer: Self-pay | Admitting: Internal Medicine

## 2017-01-14 ENCOUNTER — Other Ambulatory Visit: Payer: Self-pay | Admitting: Internal Medicine

## 2017-01-19 ENCOUNTER — Other Ambulatory Visit: Payer: Self-pay | Admitting: Internal Medicine

## 2017-01-24 ENCOUNTER — Other Ambulatory Visit: Payer: Self-pay | Admitting: Internal Medicine

## 2017-01-24 MED ORDER — PREDNISONE 10 MG PO TABS: ORAL_TABLET | ORAL | 1 refills | Status: DC

## 2017-01-24 NOTE — Telephone Encounter (Signed)
Pt. Request Dr. Jenny Reichmann refill his Prednisone 10 mg. States he is taking one pill a day as needed. States he sees Rheumatologist next month.

## 2017-01-24 NOTE — Telephone Encounter (Signed)
Copied from [place]. Topic: Quick Communication - See Telephone Encounter >> Jan 24, 2017  9:23 AM Bea Graff, NT wrote: CRM for notification. See Telephone encounter for: Pt requesting a refill request of prednisone called into [facility]. He is completley out.  01/24/17.

## 2017-01-24 NOTE — Addendum Note (Signed)
Addended by: Biagio Borg on: 01/24/2017 02:04 PM   Modules accepted: Orders

## 2017-01-24 NOTE — Telephone Encounter (Signed)
Done erx 

## 2017-02-17 ENCOUNTER — Other Ambulatory Visit: Payer: Self-pay | Admitting: Cardiovascular Disease

## 2017-02-17 ENCOUNTER — Other Ambulatory Visit: Payer: Self-pay | Admitting: Internal Medicine

## 2017-02-17 DIAGNOSIS — E785 Hyperlipidemia, unspecified: Secondary | ICD-10-CM

## 2017-02-17 NOTE — Telephone Encounter (Signed)
Done erx 

## 2017-03-08 DIAGNOSIS — E663 Overweight: Secondary | ICD-10-CM | POA: Diagnosis not present

## 2017-03-08 DIAGNOSIS — Z6825 Body mass index (BMI) 25.0-25.9, adult: Secondary | ICD-10-CM | POA: Diagnosis not present

## 2017-03-08 DIAGNOSIS — M0579 Rheumatoid arthritis with rheumatoid factor of multiple sites without organ or systems involvement: Secondary | ICD-10-CM | POA: Diagnosis not present

## 2017-03-08 DIAGNOSIS — M2550 Pain in unspecified joint: Secondary | ICD-10-CM | POA: Diagnosis not present

## 2017-03-26 ENCOUNTER — Other Ambulatory Visit: Payer: Self-pay | Admitting: Internal Medicine

## 2017-04-13 DIAGNOSIS — M150 Primary generalized (osteo)arthritis: Secondary | ICD-10-CM | POA: Diagnosis not present

## 2017-04-13 DIAGNOSIS — M0579 Rheumatoid arthritis with rheumatoid factor of multiple sites without organ or systems involvement: Secondary | ICD-10-CM | POA: Diagnosis not present

## 2017-04-13 DIAGNOSIS — Z6824 Body mass index (BMI) 24.0-24.9, adult: Secondary | ICD-10-CM | POA: Diagnosis not present

## 2017-04-13 DIAGNOSIS — M2550 Pain in unspecified joint: Secondary | ICD-10-CM | POA: Diagnosis not present

## 2017-04-20 ENCOUNTER — Other Ambulatory Visit: Payer: Self-pay | Admitting: Internal Medicine

## 2017-04-20 ENCOUNTER — Other Ambulatory Visit: Payer: Self-pay | Admitting: Cardiovascular Disease

## 2017-04-20 DIAGNOSIS — E785 Hyperlipidemia, unspecified: Secondary | ICD-10-CM

## 2017-04-20 NOTE — Telephone Encounter (Signed)
REFILL 

## 2017-05-06 ENCOUNTER — Ambulatory Visit: Payer: Medicare Other | Admitting: Internal Medicine

## 2017-05-09 ENCOUNTER — Ambulatory Visit: Payer: Medicare Other | Admitting: Internal Medicine

## 2017-05-17 ENCOUNTER — Encounter: Payer: Self-pay | Admitting: Internal Medicine

## 2017-05-17 ENCOUNTER — Other Ambulatory Visit (INDEPENDENT_AMBULATORY_CARE_PROVIDER_SITE_OTHER): Payer: Medicare Other

## 2017-05-17 ENCOUNTER — Ambulatory Visit: Payer: Medicare Other | Admitting: Internal Medicine

## 2017-05-17 VITALS — BP 110/68 | HR 73 | Temp 97.8°F | Ht 70.0 in | Wt 169.0 lb

## 2017-05-17 DIAGNOSIS — I10 Essential (primary) hypertension: Secondary | ICD-10-CM

## 2017-05-17 DIAGNOSIS — Z0000 Encounter for general adult medical examination without abnormal findings: Secondary | ICD-10-CM | POA: Diagnosis not present

## 2017-05-17 DIAGNOSIS — R739 Hyperglycemia, unspecified: Secondary | ICD-10-CM | POA: Diagnosis not present

## 2017-05-17 DIAGNOSIS — E785 Hyperlipidemia, unspecified: Secondary | ICD-10-CM

## 2017-05-17 LAB — HEPATIC FUNCTION PANEL
ALT: 18 U/L (ref 0–53)
AST: 18 U/L (ref 0–37)
Albumin: 4.1 g/dL (ref 3.5–5.2)
Alkaline Phosphatase: 51 U/L (ref 39–117)
Bilirubin, Direct: 0.1 mg/dL (ref 0.0–0.3)
Total Bilirubin: 0.2 mg/dL (ref 0.2–1.2)
Total Protein: 7.2 g/dL (ref 6.0–8.3)

## 2017-05-17 LAB — BASIC METABOLIC PANEL
BUN: 8 mg/dL (ref 6–23)
CHLORIDE: 99 meq/L (ref 96–112)
CO2: 29 mEq/L (ref 19–32)
Calcium: 9.4 mg/dL (ref 8.4–10.5)
Creatinine, Ser: 0.73 mg/dL (ref 0.40–1.50)
GFR: 112.23 mL/min (ref >60.00)
Glucose, Bld: 158 mg/dL — ABNORMAL HIGH (ref 70–99)
POTASSIUM: 3.9 meq/L (ref 3.5–5.1)
SODIUM: 137 meq/L (ref 135–145)

## 2017-05-17 LAB — LIPID PANEL
CHOLESTEROL: 140 mg/dL (ref 0–200)
HDL: 60.7 mg/dL (ref >39.00)
LDL Cholesterol: 46 mg/dL (ref 0–99)
NonHDL: 79.24
TRIGLYCERIDES: 165 mg/dL — AB (ref 0.0–149.0)
Total CHOL/HDL Ratio: 2
VLDL: 33 mg/dL (ref 0.0–40.0)

## 2017-05-17 LAB — HEMOGLOBIN A1C: HEMOGLOBIN A1C: 6.8 % — AB (ref 4.6–6.5)

## 2017-05-17 NOTE — Progress Notes (Signed)
Subjective:    Patient ID: Phillip [name], male    DOB: 01-13-1946, 72 y.o.   MRN: [id number]  HPI  Here to f/u; overall doing ok,  Pt denies chest pain, increasing sob or doe, wheezing, orthopnea, PND, increased LE swelling, palpitations, dizziness or syncope.  Pt denies new neurological symptoms such as new headache, or facial or extremity weakness or numbness.  Pt denies polydipsia, polyuria, or low sugar episode.  Pt states overall good compliance with meds, mostly trying to follow appropriate diet, with wt overall stable,  but little exercise however.  Joint pain improved with plaquinil, seeing optho asd.  No other new complaints or interval change Past Medical History:  Diagnosis Date  . Anemia   . Anxiety   . Arthritis   . Bipolar depression ([place])   . CAD, multiple vessel   . Chronic pain disorder    chronic bilat shoulder pain and feet pain  . Depression   . Erectile dysfunction 01/17/2013  . GERD (gastroesophageal reflux disease)   . History of colon polyps   . Hyperlipidemia   . Hypertension   . Insomnia   . Iron deficiency anemia 01/16/2013  . Mixed hyperlipidemia 03/08/2013  . Status post insertion of drug-eluting stent into left anterior descending (LAD) artery for coronary artery disease   . Stented coronary artery 01/16/2013   X 2  . Systolic murmur   . Unspecified vitamin D deficiency 01/17/2013   Past Surgical History:  Procedure Laterality Date  . CAROTID STENT  2008  . CORONARY ANGIOPLASTY WITH STENT PLACEMENT  about 2014   current Cardiology: Dr Orene Desanctis  . EYE SURGERY      reports that he quit smoking about 15 years ago. His smoking use included cigarettes. He has a 20.00 pack-year smoking history. His smokeless tobacco use includes chew. He reports that he drinks alcohol. He reports that he does not use drugs. family history includes Alzheimer's disease in his brother; Cancer in his maternal grandfather, maternal grandmother, and mother; Heart disease in  his maternal grandmother, mother, and paternal grandfather; Hyperlipidemia in his maternal grandmother; Lung cancer in his father; Stroke in his maternal grandmother and paternal grandfather. Allergies  Allergen Reactions  . Lipitor [Atorvastatin] Other (See Comments)    myalgia   Current Outpatient Medications on File Prior to Visit  Medication Sig Dispense Refill  . amLODipine (NORVASC) 2.5 MG tablet TAKE 1 TABLET BY MOUTH DAILY 90 tablet 2  . aspirin 81 MG tablet Take 81 mg by mouth daily.    . carbamazepine (TEGRETOL) 200 MG tablet TAKE 1 TABLET BY MOUTH TWICE DAILY 180 tablet 2  . carvedilol (COREG) 25 MG tablet TAKE 1 TABLET BY MOUTH TWICE DAILY WITH MEALS 180 tablet 3  . clobetasol cream (TEMOVATE) 0.05 % APPLY TOPICALLY TWICE DAILY AS NEEDED 60 g 1  . clopidogrel (PLAVIX) 75 MG tablet TAKE 1 TABLET BY MOUTH DAILY 90 tablet 2  . clopidogrel (PLAVIX) 75 MG tablet TAKE 1 TABLET BY MOUTH DAILY 90 tablet 2  . Cyanocobalamin (VITAMIN B 12 PO) Take by mouth.    . fenofibrate 54 MG tablet TAKE 1 TABLET BY MOUTH DAILY 90 tablet 2  . folic acid (FOLVITE) 1 MG tablet TAKE 1 TABLET BY MOUTH DAILY 90 tablet 0  . HYDROcodone-acetaminophen (NORCO/VICODIN) 5-325 MG tablet Take 1 tablet by mouth every 6 (six) hours as needed for moderate pain. 60 tablet 0  . hydrOXYzine (ATARAX/VISTARIL) 25 MG tablet TAKE 1 TABLET BY MOUTH 3  TIMES DAILY AS NEEDED 270 tablet 2  . leflunomide (ARAVA) 20 MG tablet Take 1 tablet by mouth daily.    . nitroGLYCERIN (NITROSTAT) 0.4 MG SL tablet Place 1 tablet (0.4 mg total) under the tongue every 5 (five) minutes as needed for chest pain. 25 tablet 3  . Omega-3 Fatty Acids (FISH OIL PO) Take by mouth daily.    . pantoprazole (PROTONIX) 40 MG tablet TAKE 1 TABLET BY MOUTH TWICE DAILY 180 tablet 2  . predniSONE (DELTASONE) 10 MG tablet 1 tab by mouth daily 30 tablet 1  . ramipril (ALTACE) 2.5 MG capsule TAKE 1 CAPSULE BY MOUTH DAILY 90 capsule 2  . rosuvastatin (CRESTOR) 20  MG tablet TAKE 1 TABLET BY MOUTH DAILY 30 tablet 0  . rosuvastatin (CRESTOR) 20 MG tablet Take 1 tablet (20 mg total) by mouth daily. NEED OV. 90 tablet 0  . sildenafil (REVATIO) 20 MG tablet Take 3 pills daily as needed 60 tablet 2  . traZODone (DESYREL) 50 MG tablet TAKE 1/2 TO 1 TABLETS BY MOUTH AT BEDTIME AS NEEDED FOR SLEEP 90 tablet 5  . traZODone (DESYREL) 50 MG tablet TAKE 1/2 TO 1 TABLET BY MOUTH EVERY NIGHT AT BEDTIME FOR SLEEP 90 tablet 1  . traZODone (DESYREL) 50 MG tablet TAKE 1/2 TO 1 TABLET BY MOUTH EVERY NIGHT AT BEDTIME FOR SLEEP 90 tablet 1  . triamcinolone (NASACORT AQ) 55 MCG/ACT AERO nasal inhaler Place 2 sprays into the nose daily. 1 Inhaler 12   Current Facility-Administered Medications on File Prior to Visit  Medication Dose Route Frequency Provider Last Rate Last Dose  . triamcinolone acetonide (KENALOG) 10 MG/ML injection 10 mg  10 mg Other Once Landis Martins, DPM       Review of Systems  Constitutional: Negative for other unusual diaphoresis or sweats HENT: Negative for ear discharge or swelling Eyes: Negative for other worsening visual disturbances Respiratory: Negative for stridor or other swelling  Gastrointestinal: Negative for worsening distension or other blood Genitourinary: Negative for retention or other urinary change Musculoskeletal: Negative for other MSK pain or swelling Skin: Negative for color change or other new lesions Neurological: Negative for worsening tremors and other numbness  Psychiatric/Behavioral: Negative for worsening agitation or other fatigue All other system neg per pt    Objective:   Physical Exam BP 110/68   Pulse 73   Temp 97.8 F (36.6 C) (Oral)   Ht 5\' 10"  (1.778 m)   Wt 169 lb (76.7 kg)   SpO2 96%   BMI 24.25 kg/m  VS noted, not ill appearing Constitutional: Pt appears in NAD HENT: Head: NCAT.  Right Ear: External ear normal.  Left Ear: External ear normal.  Eyes: . Pupils are equal, round, and reactive to  light. Conjunctivae and EOM are normal Nose: without d/c or deformity Neck: Neck supple. Gross normal ROM Cardiovascular: Normal rate and regular rhythm.   Pulmonary/Chest: Effort normal and breath sounds without rales or wheezing.  Abd:  Soft, NT, ND, + BS, no organomegaly Neurological: Pt is alert. At baseline orientation, motor grossly intact Skin: Skin is warm. No rashes, other new lesions, no LE edema Psychiatric: Pt behavior is normal without agitation  No other exam findings Lab Results  Component Value Date   WBC 5.4 06/01/2016   HGB 10.9 (L) 06/01/2016   HCT 32.1 (L) 06/01/2016   PLT 405.0 (H) 06/01/2016   GLUCOSE 158 (H) 05/17/2017   CHOL 140 05/17/2017   TRIG 165.0 (H) 05/17/2017   HDL  60.70 05/17/2017   LDLDIRECT 142.5 02/27/2014   LDLCALC 46 05/17/2017   ALT 18 05/17/2017   AST 18 05/17/2017   NA 137 05/17/2017   K 3.9 05/17/2017   CL 99 05/17/2017   CREATININE 0.73 05/17/2017   BUN 8 05/17/2017   CO2 29 05/17/2017   TSH 1.74 06/01/2016   PSA 0.49 06/01/2016   INR 1.0 09/15/2008   HGBA1C 6.8 (H) 05/17/2017      Assessment & Plan:

## 2017-05-17 NOTE — Patient Instructions (Signed)
Please continue all other medications as before, and refills have been done if requested.  Please have the pharmacy call with any other refills you may need.  Please continue your efforts at being more active, low cholesterol diet, and weight control.  You are otherwise up to date with prevention measures today.  Please keep your appointments with your specialists as you may have planned  Your blood was drawn this morning  You will be contacted by phone if any changes need to be made immediately.  Otherwise, you will receive a letter about your results with an explanation, but please check with MyChart first.  Please remember to sign up for MyChart if you have not done so, as this will be important to you in the future with finding out test results, communicating by private email, and scheduling acute appointments online when needed.  Please return in 6 months, or sooner if needed, with Lab testing done 3-5 days before

## 2017-05-18 ENCOUNTER — Other Ambulatory Visit: Payer: Self-pay | Admitting: Internal Medicine

## 2017-05-18 ENCOUNTER — Encounter: Payer: Self-pay | Admitting: Internal Medicine

## 2017-05-18 NOTE — Assessment & Plan Note (Signed)
Lab Results  Component Value Date   HGBA1C 6.8 (H) 05/17/2017  stable overall by history and exam, recent data reviewed with pt, and pt to continue medical treatment as before,  to f/u any worsening symptoms or concerns

## 2017-05-18 NOTE — Assessment & Plan Note (Signed)
Lab Results  Component Value Date   LDLCALC 46 05/17/2017  stable overall by history and exam, recent data reviewed with pt, and pt to continue medical treatment as before,  to f/u any worsening symptoms or concerns

## 2017-05-18 NOTE — Telephone Encounter (Signed)
Done erx 

## 2017-05-18 NOTE — Telephone Encounter (Signed)
Pt has been referred to Dr Trudie Reed for RA; I have to decline further prednisone as he should f/u there for overall RA management

## 2017-05-18 NOTE — Assessment & Plan Note (Signed)
BP Readings from Last 3 Encounters:  05/17/17 110/68  12/06/16 132/76  11/11/16 116/74  stable overall by history and exam, recent data reviewed with pt, and pt to continue medical treatment as before,  to f/u any worsening symptoms or concerns

## 2017-05-18 NOTE — Telephone Encounter (Signed)
Routing to dr john, please advise, thanks 

## 2017-06-06 ENCOUNTER — Other Ambulatory Visit: Payer: Self-pay | Admitting: Internal Medicine

## 2017-06-16 ENCOUNTER — Encounter: Payer: Self-pay | Admitting: Cardiology

## 2017-06-16 ENCOUNTER — Ambulatory Visit: Payer: Medicare Other | Admitting: Cardiology

## 2017-06-16 VITALS — BP 140/66 | HR 63 | Ht 70.0 in | Wt 163.0 lb

## 2017-06-16 DIAGNOSIS — R072 Precordial pain: Secondary | ICD-10-CM | POA: Diagnosis not present

## 2017-06-16 DIAGNOSIS — E782 Mixed hyperlipidemia: Secondary | ICD-10-CM | POA: Diagnosis not present

## 2017-06-16 DIAGNOSIS — M069 Rheumatoid arthritis, unspecified: Secondary | ICD-10-CM

## 2017-06-16 DIAGNOSIS — Z9861 Coronary angioplasty status: Secondary | ICD-10-CM

## 2017-06-16 DIAGNOSIS — R079 Chest pain, unspecified: Secondary | ICD-10-CM

## 2017-06-16 DIAGNOSIS — I2510 Atherosclerotic heart disease of native coronary artery without angina pectoris: Secondary | ICD-10-CM

## 2017-06-16 MED ORDER — NITROGLYCERIN 0.4 MG SL SUBL: 0.4000 mg | SUBLINGUAL_TABLET | SUBLINGUAL | 3 refills | Status: DC | PRN

## 2017-06-16 NOTE — Assessment & Plan Note (Signed)
Moderate risk for ACS- check Lexiscan (pt unable to exercise)

## 2017-06-16 NOTE — Patient Instructions (Signed)
Phillip [name], [place], recommends that you continue on your current medications as directed. Please refer to the Current Medication list given to you today.  Your physician has requested that you have a lexiscan myoview. For further information please visit [email or web address]. Please follow instruction sheet, as given.  MEDICATIONS TO HOLD: NONE  Your physician recommends that you schedule a follow-up appointment in 3-4 weeks with [facility].

## 2017-06-16 NOTE — Assessment & Plan Note (Signed)
On high dose statin.

## 2017-06-16 NOTE — Assessment & Plan Note (Signed)
Followed by Rheumatologist, on chronic steroids

## 2017-06-16 NOTE — Progress Notes (Signed)
06/16/2017 Phillip [name]   1945/10/27  [id number]  Primary Physician Biagio Borg, MD Primary Cardiologist: Dr Sallyanne Kuster  HPI:  72 y/o male with a history of CAD, s/p RI PCI in 2008, OM PCI 2010, Myoview Nov 2015. We last saw him in Aug 2017. He is in the office today for follow up. He admits he has noticed some exertional fatigue and SSCP "tightness" for the past few months. He tells me he had to stop 3 times mowing his yard recently. He denies any rest symptoms. He has not used NTG.  He thinks this started when his Rheumatologist started a new medication- he can't remember the name.    Current Outpatient Medications  Medication Sig Dispense Refill  . ALPRAZolam (XANAX) 1 MG tablet TAKE 1/2 TO 1 TABLET BY MOUTH TWICE DAILY AS NEEDED 60 tablet 2  . amLODipine (NORVASC) 2.5 MG tablet TAKE 1 TABLET BY MOUTH DAILY 90 tablet 2  . aspirin 81 MG tablet Take 81 mg by mouth daily.    . carbamazepine (TEGRETOL) 200 MG tablet TAKE 1 TABLET BY MOUTH TWICE DAILY 180 tablet 2  . carvedilol (COREG) 25 MG tablet TAKE 1 TABLET BY MOUTH TWICE DAILY WITH MEALS 180 tablet 3  . clobetasol cream (TEMOVATE) 0.05 % APPLY TOPICALLY TWICE DAILY AS NEEDED 60 g 1  . clopidogrel (PLAVIX) 75 MG tablet TAKE 1 TABLET BY MOUTH DAILY 90 tablet 2  . clopidogrel (PLAVIX) 75 MG tablet TAKE 1 TABLET BY MOUTH DAILY 90 tablet 2  . Cyanocobalamin (VITAMIN B 12 PO) Take by mouth.    . fenofibrate 54 MG tablet TAKE 1 TABLET BY MOUTH DAILY 90 tablet 2  . folic acid (FOLVITE) 1 MG tablet TAKE 1 TABLET BY MOUTH DAILY 90 tablet 0  . HYDROcodone-acetaminophen (NORCO/VICODIN) 5-325 MG tablet Take 1 tablet by mouth every 6 (six) hours as needed for moderate pain. 60 tablet 0  . hydrOXYzine (ATARAX/VISTARIL) 25 MG tablet TAKE 1 TABLET BY MOUTH 3 TIMES DAILY AS NEEDED 270 tablet 2  . leflunomide (ARAVA) 20 MG tablet Take 1 tablet by mouth daily.    . nitroGLYCERIN (NITROSTAT) 0.4 MG SL tablet Place 1 tablet (0.4 mg total) under the  tongue every 5 (five) minutes as needed for chest pain. 25 tablet 3  . Omega-3 Fatty Acids (FISH OIL PO) Take by mouth daily.    . pantoprazole (PROTONIX) 40 MG tablet TAKE 1 TABLET BY MOUTH TWICE DAILY 180 tablet 2  . predniSONE (DELTASONE) 10 MG tablet TAKE 1 TABLET BY MOUTH DAILY 30 tablet 1  . ramipril (ALTACE) 2.5 MG capsule TAKE 1 CAPSULE BY MOUTH DAILY 90 capsule 2  . rosuvastatin (CRESTOR) 20 MG tablet TAKE 1 TABLET BY MOUTH DAILY 30 tablet 0  . rosuvastatin (CRESTOR) 20 MG tablet Take 1 tablet (20 mg total) by mouth daily. NEED OV. 90 tablet 0  . sildenafil (REVATIO) 20 MG tablet Take 3 pills daily as needed 60 tablet 2  . traZODone (DESYREL) 50 MG tablet TAKE 1/2 TO 1 TABLETS BY MOUTH AT BEDTIME AS NEEDED FOR SLEEP 90 tablet 5  . traZODone (DESYREL) 50 MG tablet TAKE 1/2 TO 1 TABLET BY MOUTH EVERY NIGHT AT BEDTIME FOR SLEEP 90 tablet 1  . traZODone (DESYREL) 50 MG tablet TAKE 1/2 TO 1 TABLET BY MOUTH EVERY NIGHT AT BEDTIME FOR SLEEP 90 tablet 1  . triamcinolone (NASACORT AQ) 55 MCG/ACT AERO nasal inhaler Place 2 sprays into the nose daily. 1 Inhaler 12  Current Facility-Administered Medications  Medication Dose Route Frequency Provider Last Rate Last Dose  . triamcinolone acetonide (KENALOG) 10 MG/ML injection 10 mg  10 mg Other Once Landis Martins, DPM        Allergies  Allergen Reactions  . Lipitor [Atorvastatin] Other (See Comments)    myalgia    Past Medical History:  Diagnosis Date  . Anemia   . Anxiety   . Arthritis   . Bipolar depression ([place])   . CAD, multiple vessel   . Chronic pain disorder    chronic bilat shoulder pain and feet pain  . Depression   . Erectile dysfunction 01/17/2013  . GERD (gastroesophageal reflux disease)   . History of colon polyps   . Hyperlipidemia   . Hypertension   . Insomnia   . Iron deficiency anemia 01/16/2013  . Mixed hyperlipidemia 03/08/2013  . Status post insertion of drug-eluting stent into left anterior descending (LAD)  artery for coronary artery disease   . Stented coronary artery 01/16/2013   X 2  . Systolic murmur   . Unspecified vitamin D deficiency 01/17/2013    Social History   Socioeconomic History  . Marital status: Single    Spouse name: Not on file  . Number of children: Not on file  . Years of education: Not on file  . Highest education level: Not on file  Occupational History  . Not on file  Social Needs  . Financial resource strain: Not on file  . Food insecurity:    Worry: Not on file    Inability: Not on file  . Transportation needs:    Medical: Not on file    Non-medical: Not on file  Tobacco Use  . Smoking status: Former Smoker    Packs/day: 1.00    Years: 20.00    Pack years: 20.00    Types: Cigarettes    Last attempt to quit: 02/22/2002    Years since quitting: 15.3  . Smokeless tobacco: Current User    Types: Chew  Substance and Sexual Activity  . Alcohol use: Yes  . Drug use: No  . Sexual activity: Not on file  Lifestyle  . Physical activity:    Days per week: Not on file    Minutes per session: Not on file  . Stress: Not on file  Relationships  . Social connections:    Talks on phone: Not on file    Gets together: Not on file    Attends religious service: Not on file    Active member of club or organization: Not on file    Attends meetings of clubs or organizations: Not on file    Relationship status: Not on file  . Intimate partner violence:    Fear of current or ex partner: Not on file    Emotionally abused: Not on file    Physically abused: Not on file    Forced sexual activity: Not on file  Other Topics Concern  . Not on file  Social History Narrative  . Not on file     Family History  Problem Relation Age of Onset  . Cancer Mother   . Heart disease Mother   . Heart disease Maternal Grandmother   . Stroke Maternal Grandmother   . Hyperlipidemia Maternal Grandmother   . Cancer Maternal Grandmother   . Lung cancer Father   . Alzheimer's  disease Brother   . Cancer Maternal Grandfather   . Heart disease Paternal Grandfather   . Stroke Paternal  Grandfather      Review of Systems: General: negative for chills, fever, night sweats or weight changes.  Cardiovascular: negative for, edema, orthopnea, palpitations, paroxysmal nocturnal dyspnea or shortness of breath Dermatological: negative for rash Respiratory: negative for cough or wheezing Urologic: negative for hematuria Abdominal: negative for nausea, vomiting, diarrhea, bright red blood per rectum, melena, or hematemesis Neurologic: negative for visual changes, syncope, or dizziness All other systems reviewed and are otherwise negative except as noted above.    Blood pressure 140/66, pulse 63, height 5\' 10"  (1.778 m), weight 163 lb (73.9 kg).  General appearance: alert, cooperative and no distress Neck: no carotid bruit and no JVD Lungs: clear to auscultation bilaterally Heart: regular rate and rhythm Extremities: extremities normal, atraumatic, no cyanosis or edema Skin: Skin color, texture, turgor normal. No rashes or lesions Neurologic: Grossly normal  EKG NSR  ASSESSMENT AND PLAN:   Chest pain with moderate risk of acute coronary syndrome Moderate risk for ACS- check Lexiscan (pt unable to exercise)  CAD -S/P PCI 2008, 2010 2008 drug-eluting Cypher 3.0 x 28 mm stent to the ramus intermedius artery 2010, drug-eluting Cypher 2.5 x 30 mm stent to the first oblique marginal artery Myoview low risk 2015  Rheumatoid arthritis Followed by Rheumatologist, on chronic steroids  Mixed hyperlipidemia On high dose statin   PLAN  Refill Sl NTG, Lexiscan Myoview.   Kerin Ransom PA-C 06/16/2017 3:33 PM

## 2017-06-16 NOTE — Assessment & Plan Note (Signed)
2008 drug-eluting Cypher 3.0 x 28 mm stent to the ramus intermedius artery 2010, drug-eluting Cypher 2.5 x 30 mm stent to the first oblique marginal artery Myoview low risk 2015

## 2017-06-17 ENCOUNTER — Other Ambulatory Visit: Payer: Self-pay | Admitting: Internal Medicine

## 2017-06-17 ENCOUNTER — Telehealth (HOSPITAL_COMMUNITY): Payer: Self-pay

## 2017-06-17 NOTE — Addendum Note (Signed)
Addended by: Venetia Maxon on: 06/17/2017 01:44 PM   Modules accepted: Orders

## 2017-06-17 NOTE — Telephone Encounter (Signed)
Encounter complete. 

## 2017-06-21 ENCOUNTER — Ambulatory Visit (HOSPITAL_COMMUNITY)
Admission: RE | Admit: 2017-06-21 | Discharge: 2017-06-21 | Disposition: A | Discharge status: Home / Self Care | Payer: Medicare Other | Source: Ambulatory Visit | Attending: Internal Medicine | Admitting: Internal Medicine

## 2017-06-21 DIAGNOSIS — Z8249 Family history of ischemic heart disease and other diseases of the circulatory system: Secondary | ICD-10-CM | POA: Diagnosis not present

## 2017-06-21 DIAGNOSIS — R072 Precordial pain: Secondary | ICD-10-CM | POA: Insufficient documentation

## 2017-06-21 DIAGNOSIS — E785 Hyperlipidemia, unspecified: Secondary | ICD-10-CM | POA: Diagnosis not present

## 2017-06-21 DIAGNOSIS — I2510 Atherosclerotic heart disease of native coronary artery without angina pectoris: Secondary | ICD-10-CM | POA: Insufficient documentation

## 2017-06-21 DIAGNOSIS — I10 Essential (primary) hypertension: Secondary | ICD-10-CM | POA: Insufficient documentation

## 2017-06-21 DIAGNOSIS — K219 Gastro-esophageal reflux disease without esophagitis: Secondary | ICD-10-CM | POA: Insufficient documentation

## 2017-06-21 DIAGNOSIS — R011 Cardiac murmur, unspecified: Secondary | ICD-10-CM | POA: Diagnosis not present

## 2017-06-21 DIAGNOSIS — Z87891 Personal history of nicotine dependence: Secondary | ICD-10-CM | POA: Diagnosis not present

## 2017-06-21 LAB — MYOCARDIAL PERFUSION IMAGING
LV dias vol: 139 mL (ref 62–150)
LV sys vol: 76 mL
Peak HR: 68 {beats}/min
Rest HR: 56 {beats}/min
SDS: 0
SRS: 1
SSS: 1
TID: 1.2

## 2017-06-21 MED ORDER — TECHNETIUM TC 99M TETROFOSMIN IV KIT
31.6000 | PACK | Freq: Once | INTRAVENOUS | Status: AC | PRN
  Administered 2017-06-21: 31.6 via INTRAVENOUS
  Filled 2017-06-21: qty 32

## 2017-06-21 MED ORDER — TECHNETIUM TC 99M TETROFOSMIN IV KIT
10.2000 | PACK | Freq: Once | INTRAVENOUS | Status: AC | PRN
  Administered 2017-06-21: 10.2 via INTRAVENOUS
  Filled 2017-06-21: qty 11

## 2017-06-21 MED ORDER — REGADENOSON 0.4 MG/5ML IV SOLN
0.4000 mg | Freq: Once | INTRAVENOUS | Status: AC
Start: 2017-06-21 — End: 2017-06-21
  Administered 2017-06-21: 0.4 mg via INTRAVENOUS

## 2017-06-22 ENCOUNTER — Telehealth: Payer: Self-pay | Admitting: Cardiology

## 2017-06-22 ENCOUNTER — Telehealth: Payer: Self-pay

## 2017-06-22 MED ORDER — CARVEDILOL 25 MG PO TABS: 12.5000 mg | ORAL_TABLET | Freq: Two times a day (BID) | ORAL | 3 refills | Status: DC

## 2017-06-22 NOTE — Telephone Encounter (Signed)
I called pt to discuss his Myoview results. [place]. He has not had chest pain, his main complaint is generalized fatigue and DOE. I suggested he might try decreasing his Coreg to 12.5 mg BID. I'll arrange a f/u with Dr Sallyanne Kuster. He knows to contact us if his symptoms change.  Kerin Ransom PA-C 06/22/2017 8:31 AM

## 2017-06-22 NOTE — Telephone Encounter (Signed)
Per Lurena Joiner request, letter with medication change has been mailed to patient.

## 2017-06-22 NOTE — Telephone Encounter (Signed)
-----   Message from Erlene Quan, [place] sent at 06/22/2017 10:03 AM EDT -----   T can you send him a letter asking him to decrease his Coreg to 12.5 mg BID. He also needs a f/u with Dr Sallyanne Kuster.   Kerin Ransom PA-C  06/22/2017  8:32 AM

## 2017-06-22 NOTE — Telephone Encounter (Signed)
Letter mailed to patient, MEDICATION LIST UPDATED.

## 2017-07-05 ENCOUNTER — Ambulatory Visit: Payer: Medicare Other | Admitting: Cardiology

## 2017-07-05 ENCOUNTER — Encounter: Payer: Self-pay | Admitting: Cardiology

## 2017-07-05 VITALS — BP 140/64 | HR 54 | Ht 70.0 in | Wt 164.6 lb

## 2017-07-05 DIAGNOSIS — I2510 Atherosclerotic heart disease of native coronary artery without angina pectoris: Secondary | ICD-10-CM | POA: Diagnosis not present

## 2017-07-05 DIAGNOSIS — Z9861 Coronary angioplasty status: Secondary | ICD-10-CM | POA: Diagnosis not present

## 2017-07-05 DIAGNOSIS — I10 Essential (primary) hypertension: Secondary | ICD-10-CM | POA: Diagnosis not present

## 2017-07-05 DIAGNOSIS — R079 Chest pain, unspecified: Secondary | ICD-10-CM | POA: Diagnosis not present

## 2017-07-05 DIAGNOSIS — M069 Rheumatoid arthritis, unspecified: Secondary | ICD-10-CM

## 2017-07-05 DIAGNOSIS — H524 Presbyopia: Secondary | ICD-10-CM | POA: Diagnosis not present

## 2017-07-05 MED ORDER — CARVEDILOL 12.5 MG PO TABS: 12.5000 mg | ORAL_TABLET | Freq: Two times a day (BID) | ORAL | Status: DC

## 2017-07-05 NOTE — Progress Notes (Signed)
Thanks, I agree the nuclear study changes are not compelling. MCr

## 2017-07-05 NOTE — Progress Notes (Signed)
07/05/2017 Phillip [name]   1945/11/10  [id number]  Primary Physician Biagio Borg, MD Primary Cardiologist: Dr Sallyanne Kuster  HPI:  72 y/o male with a history of CAD, s/p RI PCI in 2008, OM PCI 2010, Myoview Nov 2015. We last saw him in Aug 2017. He was seen in the office 06/16/17. He admitted to some exertional fatigue and SSCP "tightness" for the past few months. He tells me he had to stop 3 times mowing his yard recently. He denied any rest symptoms. He has not used NTG.  He thinks this started when his Rheumatologist added Plaquenil 200 mg. I arranged for him to have a Myoview which showed small inferior scar with an EF of 45-50%. This is a little different from his past Myoview which showed no defect with an EF of 55%. I'm seeing him back today in follow up. I had suggested previously that he try cutting his Coreg back to 12.5 mg BID secondary to bradycardia and fatigue but he forgot to do this. He tells me today he feels "great". He cut the Plaquenil in half and he thinks this helped. He denies chest pain or SOB.    Current Outpatient Medications  Medication Sig Dispense Refill  . ALPRAZolam (XANAX) 1 MG tablet TAKE 1/2 TO 1 TABLET BY MOUTH TWICE DAILY AS NEEDED 60 tablet 2  . amLODipine (NORVASC) 2.5 MG tablet TAKE 1 TABLET BY MOUTH DAILY 90 tablet 2  . aspirin 81 MG tablet Take 81 mg by mouth daily.    . carbamazepine (TEGRETOL) 200 MG tablet TAKE 1 TABLET BY MOUTH TWICE DAILY 180 tablet 2  . carvedilol (COREG) 12.5 MG tablet Take 1 tablet (12.5 mg total) by mouth 2 (two) times daily with a meal.    . clobetasol cream (TEMOVATE) 0.05 % APPLY TOPICALLY TWICE DAILY AS NEEDED 60 g 1  . clopidogrel (PLAVIX) 75 MG tablet TAKE 1 TABLET BY MOUTH DAILY 90 tablet 2  . clopidogrel (PLAVIX) 75 MG tablet TAKE 1 TABLET BY MOUTH DAILY 90 tablet 2  . Cyanocobalamin (VITAMIN B 12 PO) Take by mouth.    . fenofibrate 54 MG tablet TAKE 1 TABLET BY MOUTH DAILY 90 tablet 2  . folic acid (FOLVITE) 1 MG  tablet TAKE 1 TABLET BY MOUTH DAILY 90 tablet 0  . HYDROcodone-acetaminophen (NORCO/VICODIN) 5-325 MG tablet Take 1 tablet by mouth every 6 (six) hours as needed for moderate pain. 60 tablet 0  . hydrOXYzine (ATARAX/VISTARIL) 25 MG tablet TAKE 1 TABLET BY MOUTH 3 TIMES DAILY AS NEEDED 270 tablet 2  . leflunomide (ARAVA) 20 MG tablet Take 1 tablet by mouth daily.    . nitroGLYCERIN (NITROSTAT) 0.4 MG SL tablet Place 1 tablet (0.4 mg total) under the tongue every 5 (five) minutes as needed for chest pain. 25 tablet 3  . Omega-3 Fatty Acids (FISH OIL PO) Take by mouth daily.    . pantoprazole (PROTONIX) 40 MG tablet TAKE 1 TABLET BY MOUTH TWICE DAILY 180 tablet 2  . predniSONE (DELTASONE) 10 MG tablet TAKE 1 TABLET BY MOUTH DAILY 30 tablet 1  . ramipril (ALTACE) 2.5 MG capsule TAKE 1 CAPSULE BY MOUTH DAILY 90 capsule 2  . rosuvastatin (CRESTOR) 20 MG tablet TAKE 1 TABLET BY MOUTH DAILY 30 tablet 0  . rosuvastatin (CRESTOR) 20 MG tablet Take 1 tablet (20 mg total) by mouth daily. NEED OV. 90 tablet 0  . sildenafil (REVATIO) 20 MG tablet Take 3 pills daily as needed 60 tablet  2  . traZODone (DESYREL) 50 MG tablet TAKE 1/2 TO 1 TABLETS BY MOUTH AT BEDTIME AS NEEDED FOR SLEEP 90 tablet 5  . traZODone (DESYREL) 50 MG tablet TAKE 1/2 TO 1 TABLET BY MOUTH EVERY NIGHT AT BEDTIME FOR SLEEP 90 tablet 1  . traZODone (DESYREL) 50 MG tablet TAKE 1/2 TO 1 TABLET BY MOUTH EVERY NIGHT AT BEDTIME FOR SLEEP 90 tablet 1  . triamcinolone (NASACORT AQ) 55 MCG/ACT AERO nasal inhaler Place 2 sprays into the nose daily. 1 Inhaler 12   Current Facility-Administered Medications  Medication Dose Route Frequency Provider Last Rate Last Dose  . triamcinolone acetonide (KENALOG) 10 MG/ML injection 10 mg  10 mg Other Once Landis Martins, DPM        Allergies  Allergen Reactions  . Lipitor [Atorvastatin] Other (See Comments)    myalgia    Past Medical History:  Diagnosis Date  . Anemia   . Anxiety   . Arthritis   .  Bipolar depression ([place])   . CAD, multiple vessel   . Chronic pain disorder    chronic bilat shoulder pain and feet pain  . Depression   . Erectile dysfunction 01/17/2013  . GERD (gastroesophageal reflux disease)   . History of colon polyps   . Hyperlipidemia   . Hypertension   . Insomnia   . Iron deficiency anemia 01/16/2013  . Mixed hyperlipidemia 03/08/2013  . Status post insertion of drug-eluting stent into left anterior descending (LAD) artery for coronary artery disease   . Stented coronary artery 01/16/2013   X 2  . Systolic murmur   . Unspecified vitamin D deficiency 01/17/2013    Social History   Socioeconomic History  . Marital status: Single    Spouse name: Not on file  . Number of children: Not on file  . Years of education: Not on file  . Highest education level: Not on file  Occupational History  . Not on file  Social Needs  . Financial resource strain: Not on file  . Food insecurity:    Worry: Not on file    Inability: Not on file  . Transportation needs:    Medical: Not on file    Non-medical: Not on file  Tobacco Use  . Smoking status: Former Smoker    Packs/day: 1.00    Years: 20.00    Pack years: 20.00    Types: Cigarettes    Last attempt to quit: 02/22/2002    Years since quitting: 15.3  . Smokeless tobacco: Current User    Types: Chew  Substance and Sexual Activity  . Alcohol use: Yes  . Drug use: No  . Sexual activity: Not on file  Lifestyle  . Physical activity:    Days per week: Not on file    Minutes per session: Not on file  . Stress: Not on file  Relationships  . Social connections:    Talks on phone: Not on file    Gets together: Not on file    Attends religious service: Not on file    Active member of club or organization: Not on file    Attends meetings of clubs or organizations: Not on file    Relationship status: Not on file  . Intimate partner violence:    Fear of current or ex partner: Not on file    Emotionally abused:  Not on file    Physically abused: Not on file    Forced sexual activity: Not on file  Other Topics  Concern  . Not on file  Social History Narrative  . Not on file     Family History  Problem Relation Age of Onset  . Cancer Mother   . Heart disease Mother   . Heart disease Maternal Grandmother   . Stroke Maternal Grandmother   . Hyperlipidemia Maternal Grandmother   . Cancer Maternal Grandmother   . Lung cancer Father   . Alzheimer's disease Brother   . Cancer Maternal Grandfather   . Heart disease Paternal Grandfather   . Stroke Paternal Grandfather      Review of Systems: General: negative for chills, fever, night sweats or weight changes.  Cardiovascular: negative for chest pain, dyspnea on exertion, edema, orthopnea, palpitations, paroxysmal nocturnal dyspnea or shortness of breath Dermatological: negative for rash Respiratory: negative for cough or wheezing Urologic: negative for hematuria Abdominal: negative for nausea, vomiting, diarrhea, bright red blood per rectum, melena, or hematemesis Neurologic: negative for visual changes, syncope, or dizziness All other systems reviewed and are otherwise negative except as noted above.    Blood pressure 140/64, pulse (!) 54, height 5\' 10"  (1.778 m), weight 164 lb 9.6 oz (74.7 kg).  General appearance: alert, cooperative and no distress Neck: no carotid bruit and no JVD Lungs: clear to auscultation bilaterally Heart: regular rate and rhythm Extremities: extremities normal, atraumatic, no cyanosis or edema Skin: Skin color, texture, turgor normal. No rashes or lesions Neurologic: Grossly normal   ASSESSMENT AND PLAN:    Chest pain with moderate risk of acute coronary syndrome Lexiscan was intermediate but no ischemia. Symptomatically he is better since he cut his Plaquenil back  CAD -S/P PCI 2008, 2010 2008 drug-eluting Cypher 3.0 x 28 mm stent to the ramus intermedius artery 2010, drug-eluting Cypher 2.5 x 30 mm  stent to the first oblique marginal artery Myoview low risk 2015  Rheumatoid arthritis Followed by Rheumatologist  Mixed hyperlipidemia On high dose statin  Bradycardia with generalized fatigue-   PLAN  I suggested no further cardiac work up at this time. I did recommend he cut his Coreg back. He has a f/u with Dr Sallyanne Kuster in July but if he is feeling fine he'll reschedule this for 6 months.    Kerin Ransom PA-C 07/05/2017 11:22 AM

## 2017-07-05 NOTE — Patient Instructions (Signed)
Medication Instructions:   DECREASE CARVEDILOL TO 12.5 MG TWICE DAILY= 1/2 OF THE 25 MG TABLET TWICE DAILY  Follow-Up:  Your physician recommends that you schedule a follow-up appointment in: [place]

## 2017-07-07 ENCOUNTER — Other Ambulatory Visit: Payer: Self-pay | Admitting: Internal Medicine

## 2017-07-07 MED ORDER — PREDNISONE 10 MG PO TABS: ORAL_TABLET | ORAL | 1 refills | Status: DC

## 2017-07-23 ENCOUNTER — Other Ambulatory Visit: Payer: Self-pay | Admitting: Cardiovascular Disease

## 2017-07-23 ENCOUNTER — Other Ambulatory Visit: Payer: Self-pay | Admitting: Internal Medicine

## 2017-07-23 DIAGNOSIS — E785 Hyperlipidemia, unspecified: Secondary | ICD-10-CM

## 2017-08-23 ENCOUNTER — Other Ambulatory Visit: Payer: Self-pay | Admitting: Internal Medicine

## 2017-08-23 NOTE — Telephone Encounter (Signed)
Done erx 

## 2017-08-31 ENCOUNTER — Other Ambulatory Visit: Payer: Self-pay | Admitting: Internal Medicine

## 2017-09-02 ENCOUNTER — Ambulatory Visit: Payer: Medicare Other | Admitting: Cardiovascular Disease

## 2017-09-08 ENCOUNTER — Encounter: Payer: Self-pay | Admitting: Cardiovascular Disease

## 2017-09-08 ENCOUNTER — Ambulatory Visit: Payer: Medicare Other | Admitting: Cardiovascular Disease

## 2017-09-08 VITALS — BP 130/60 | HR 61 | Ht 70.0 in | Wt 163.8 lb

## 2017-09-08 DIAGNOSIS — I25118 Atherosclerotic heart disease of native coronary artery with other forms of angina pectoris: Secondary | ICD-10-CM

## 2017-09-08 DIAGNOSIS — E7800 Pure hypercholesterolemia, unspecified: Secondary | ICD-10-CM | POA: Diagnosis not present

## 2017-09-08 DIAGNOSIS — I10 Essential (primary) hypertension: Secondary | ICD-10-CM

## 2017-09-08 NOTE — Patient Instructions (Signed)
Dr Croitoru recommends that you schedule a follow-up appointment in 12 months. You will receive a reminder letter in the mail two months in advance. If you don't receive a letter, please call our office to schedule the follow-up appointment.  If you need a refill on your cardiac medications before your next appointment, please call your pharmacy. 

## 2017-09-08 NOTE — Progress Notes (Signed)
Patient ID: Phillip [name], male   DOB: 1945-12-02, 72 y.o.   MRN: [id number]    Cardiology Office Note    Date:  09/08/2017   ID:  Phillip [name], DOB [birth year], MRN [id number]  PCP:  Phillip Borg, MD  Cardiologist:   Phillip Klein, MD   Chief Complaint  Patient presents with  . Follow-up    Nuclear stress test    History of Present Illness:  Phillip [name] is a 72 y.o. male with recent chest discomfort, known CAD, HTN and hyperlipidemia.  A couple of months ago he had to stop smoking along with few days in a row because he developed chest tightness.  He has not had any symptoms at rest.  He underwent a nuclear stress test that showed a mildly depressed left ventricular systolic function (EF 10%) but did not show any reversible ischemia.  The report described a fixed inferior wall defect and apical hypokinesis.  He has not had any more episodes of chest discomfort.  He has not taken any nitroglycerin he remains physically active and feels well.  He has not had exertional dyspnea and denies palpitations, syncope, leg edema, claudication, rectal dysfunction or focal neurological complaints.  Overall doing "great".  He received drug-eluting stents in 2008 (3.028 drug-eluting Cypher to proximal ramus intermedius) and 2010 (2.513 drug-eluting Cypher to OM 1 branch of left circumflex; IVUS showed 40% ostial left main, 60% proximal LAD, 50% mid LAD) and had a normal perfusion study in Nov 2015. He has normal left ventricular systolic function with an ejection fraction of 55% by scintigraphy in 2012. Intolerant to multiple statins in the past, currently tolerating Crestor.   Past Medical History:  Diagnosis Date  . Anemia   . Anxiety   . Arthritis   . Bipolar depression ([place])   . CAD, multiple vessel   . Chronic pain disorder    chronic bilat shoulder pain and feet pain  . Depression   . Erectile dysfunction 01/17/2013  . GERD (gastroesophageal reflux disease)   . History of colon  polyps   . Hyperlipidemia   . Hypertension   . Insomnia   . Iron deficiency anemia 01/16/2013  . Mixed hyperlipidemia 03/08/2013  . Status post insertion of drug-eluting stent into left anterior descending (LAD) artery for coronary artery disease   . Stented coronary artery 01/16/2013   X 2  . Systolic murmur   . Unspecified vitamin D deficiency 01/17/2013    Past Surgical History:  Procedure Laterality Date  . CAROTID STENT  2008  . CORONARY ANGIOPLASTY WITH STENT PLACEMENT  about 2014   current Cardiology: Dr Phillip [name]  . EYE SURGERY      Outpatient Medications Prior to Visit  Medication Sig Dispense Refill  . ALPRAZolam (XANAX) 1 MG tablet TAKE 1/2 TO 1 TABLET BY MOUTH TWICE DAILY AS NEEDED 60 tablet 2  . amLODipine (NORVASC) 2.5 MG tablet TAKE 1 TABLET BY MOUTH DAILY 90 tablet 2  . aspirin 81 MG tablet Take 81 mg by mouth daily.    . carbamazepine (TEGRETOL) 200 MG tablet TAKE 1 TABLET BY MOUTH TWICE DAILY 180 tablet 2  . carvedilol (COREG) 12.5 MG tablet Take 1 tablet (12.5 mg total) by mouth 2 (two) times daily with a meal.    . clobetasol cream (TEMOVATE) 0.05 % APPLY TOPICALLY TWICE DAILY AS NEEDED 60 g 1  . clopidogrel (PLAVIX) 75 MG tablet TAKE 1 TABLET BY MOUTH DAILY 90 tablet 2  . Cyanocobalamin (  VITAMIN B 12 PO) Take by mouth.    . fenofibrate 54 MG tablet TAKE 1 TABLET BY MOUTH DAILY 90 tablet 2  . folic acid (FOLVITE) 1 MG tablet TAKE 1 TABLET BY MOUTH DAILY 90 tablet 0  . HYDROcodone-acetaminophen (NORCO/VICODIN) 5-325 MG tablet Take 1 tablet by mouth every 6 (six) hours as needed for moderate pain. 60 tablet 0  . hydroxychloroquine (PLAQUENIL) 200 MG tablet Take 200 mg by mouth daily.    . hydrOXYzine (ATARAX/VISTARIL) 25 MG tablet TAKE 1 TABLET BY MOUTH 3 TIMES DAILY AS NEEDED 270 tablet 2  . leflunomide (ARAVA) 20 MG tablet Take 1 tablet by mouth daily.    . nitroGLYCERIN (NITROSTAT) 0.4 MG SL tablet Place 1 tablet (0.4 mg total) under the tongue every 5 (five)  minutes as needed for chest pain. 25 tablet 3  . Omega-3 Fatty Acids (FISH OIL PO) Take by mouth daily.    . pantoprazole (PROTONIX) 40 MG tablet TAKE 1 TABLET BY MOUTH TWICE DAILY 180 tablet 2  . predniSONE (DELTASONE) 10 MG tablet TAKE 1 TABLET BY MOUTH DAILY 90 tablet 1  . ramipril (ALTACE) 2.5 MG capsule TAKE 1 CAPSULE BY MOUTH DAILY 90 capsule 2  . rosuvastatin (CRESTOR) 20 MG tablet TAKE 1 TABLET BY MOUTH DAILY 30 tablet 0  . rosuvastatin (CRESTOR) 20 MG tablet TAKE 1 TABLET BY MOUTH DAILY 90 tablet 1  . sildenafil (REVATIO) 20 MG tablet Take 3 pills daily as needed 60 tablet 2  . traZODone (DESYREL) 50 MG tablet TAKE 1/2 TO 1 TABLETS BY MOUTH AT BEDTIME AS NEEDED FOR SLEEP 90 tablet 5  . traZODone (DESYREL) 50 MG tablet TAKE 1/2 TO 1 TABLET BY MOUTH EVERY NIGHT AT BEDTIME FOR SLEEP 90 tablet 1  . triamcinolone (NASACORT AQ) 55 MCG/ACT AERO nasal inhaler Place 2 sprays into the nose daily. 1 Inhaler 12  . clopidogrel (PLAVIX) 75 MG tablet TAKE 1 TABLET BY MOUTH DAILY 90 tablet 2  . traZODone (DESYREL) 50 MG tablet TAKE 1/2 TO 1 TABLET BY MOUTH EVERY NIGHT AT BEDTIME FOR SLEEP 90 tablet 0  . traZODone (DESYREL) 50 MG tablet TAKE 1/2 TO 1 TABLET BY MOUTH EVERY NIGHT AT BEDTIME FOR SLEEP 90 tablet 1   Facility-Administered Medications Prior to Visit  Medication Dose Route Frequency Provider Last Rate Last Dose  . triamcinolone acetonide (KENALOG) 10 MG/ML injection 10 mg  10 mg Other Once Landis Martins, DPM         Allergies:   Lipitor [atorvastatin]   Social History   Socioeconomic History  . Marital status: Single    Spouse name: Not on file  . Number of children: Not on file  . Years of education: Not on file  . Highest education level: Not on file  Occupational History  . Not on file  Social Needs  . Financial resource strain: Not on file  . Food insecurity:    Worry: Not on file    Inability: Not on file  . Transportation needs:    Medical: Not on file    Non-medical:  Not on file  Tobacco Use  . Smoking status: Former Smoker    Packs/day: 1.00    Years: 20.00    Pack years: 20.00    Types: Cigarettes    Last attempt to quit: 02/22/2002    Years since quitting: 15.5  . Smokeless tobacco: Current User    Types: Chew  Substance and Sexual Activity  . Alcohol use: Yes  .  Drug use: No  . Sexual activity: Not on file  Lifestyle  . Physical activity:    Days per week: Not on file    Minutes per session: Not on file  . Stress: Not on file  Relationships  . Social connections:    Talks on phone: Not on file    Gets together: Not on file    Attends religious service: Not on file    Active member of club or organization: Not on file    Attends meetings of clubs or organizations: Not on file    Relationship status: Not on file  Other Topics Concern  . Not on file  Social History Narrative  . Not on file     Family History:  The patient's family history includes Alzheimer's disease in his brother; Cancer in his maternal grandfather, maternal grandmother, and mother; Heart disease in his maternal grandmother, mother, and paternal grandfather; Hyperlipidemia in his maternal grandmother; Lung cancer in his father; Stroke in his maternal grandmother and paternal grandfather.   ROS:   Please see the history of present illness.    ROS All other systems reviewed and are negative.   PHYSICAL EXAM:   VS:  BP 130/60 (BP Location: Left Arm, Patient Position: Sitting)   Pulse 61   Ht 5\' 10"  (1.778 m)   Wt 163 lb 12.8 oz (74.3 kg)   SpO2 98%   BMI 23.50 kg/m     General: Alert, oriented x3, no distress, lean Head: no evidence of trauma, PERRL, EOMI, no exophtalmos or lid lag, no myxedema, no xanthelasma; normal ears, nose and oropharynx Neck: normal jugular venous pulsations and no hepatojugular reflux; brisk carotid pulses without delay and no carotid bruits Chest: clear to auscultation, no signs of consolidation by percussion or palpation, normal  fremitus, symmetrical and full respiratory excursions Cardiovascular: normal position and quality of the apical impulse, regular rhythm, normal first and second heart sounds, no murmurs, rubs or gallops Abdomen: no tenderness or distention, no masses by palpation, no abnormal pulsatility or arterial bruits, normal bowel sounds, no hepatosplenomegaly Extremities: no clubbing, cyanosis or edema; 2+ radial, ulnar and brachial pulses bilaterally; 2+ right femoral, posterior tibial and dorsalis pedis pulses; 2+ left femoral, posterior tibial and dorsalis pedis pulses; no subclavian or femoral bruits Neurological: grossly nonfocal Psych: Normal mood and affect   Wt Readings from Last 3 Encounters:  09/08/17 163 lb 12.8 oz (74.3 kg)  07/05/17 164 lb 9.6 oz (74.7 kg)  06/21/17 163 lb (73.9 kg)      Studies/Labs Reviewed:   EKG:  EKG is not ordered today.    Recent Labs: 05/17/2017: ALT 18; BUN 8; Creatinine, Ser 0.73; Potassium 3.9; Sodium 137   Lipid Panel    Component Value Date/Time   CHOL 140 05/17/2017 0829   TRIG 165.0 (H) 05/17/2017 0829   HDL 60.70 05/17/2017 0829   CHOLHDL 2 05/17/2017 0829   VLDL 33.0 05/17/2017 0829   LDLCALC 46 05/17/2017 0829   LDLDIRECT 142.5 02/27/2014 1013    ASSESSMENT:    1. Coronary artery disease involving native coronary artery of native heart with other form of angina pectoris ([place])   2. Essential hypertension   3. Hypercholesterolemia      PLAN:  In order of problems listed above:  1. CAD: Low risk nuclear stress test, although does show some decline in left ventricular systolic function.  He has not had any angina pectoris in a couple of months.  He is on 2  antianginals, amlodipine and carvedilol.  He has a prescription for sublingual nitroglycerin and knows he should never use this medication within 24 hours of using sildenafil.  He has no manifestations to suggest congestive heart failure.  Continue conservative management with aspirin,  statin, clopidogrel, beta-blocker. 2. HTN: Well-controlled 3. HLP: LDL cholesterol level is substantially improved after starting rosuvastatin, well within target range of less than 70.  HDL is also good.    Medication Adjustments/Labs and Tests Ordered: Current medicines are reviewed at length with the patient today.  Concerns regarding medicines are outlined above.  Medication changes, Labs and Tests ordered today are listed in the Patient Instructions below. Patient Instructions  Dr Sallyanne Kuster recommends that you schedule a follow-up appointment in 12 months. You will receive a reminder letter in the mail two months in advance. If you don't receive a letter, please call our office to schedule the follow-up appointment.  If you need a refill on your cardiac medications before your next appointment, please call your pharmacy.      Signed, Phillip Klein, MD  09/08/2017 4:02 PM    [facility] [place], [place], [place]  [address] Phone: [phone]; Fax: [phone]

## 2017-09-20 ENCOUNTER — Other Ambulatory Visit: Payer: Self-pay | Admitting: Internal Medicine

## 2017-09-27 ENCOUNTER — Encounter (HOSPITAL_COMMUNITY): Payer: Self-pay | Admitting: Emergency Medicine

## 2017-09-27 ENCOUNTER — Emergency Department (HOSPITAL_COMMUNITY)
Admission: EM | Admit: 2017-09-27 | Discharge: 2017-09-27 | Disposition: A | Discharge status: Home / Self Care | Payer: Medicare Other | Attending: Emergency Medicine | Admitting: Emergency Medicine

## 2017-09-27 ENCOUNTER — Other Ambulatory Visit: Payer: Self-pay

## 2017-09-27 ENCOUNTER — Emergency Department (HOSPITAL_COMMUNITY): Payer: Medicare Other

## 2017-09-27 DIAGNOSIS — R0609 Other forms of dyspnea: Secondary | ICD-10-CM | POA: Diagnosis not present

## 2017-09-27 DIAGNOSIS — Z955 Presence of coronary angioplasty implant and graft: Secondary | ICD-10-CM | POA: Insufficient documentation

## 2017-09-27 DIAGNOSIS — R079 Chest pain, unspecified: Secondary | ICD-10-CM | POA: Insufficient documentation

## 2017-09-27 DIAGNOSIS — Z7982 Long term (current) use of aspirin: Secondary | ICD-10-CM | POA: Insufficient documentation

## 2017-09-27 DIAGNOSIS — Z7902 Long term (current) use of antithrombotics/antiplatelets: Secondary | ICD-10-CM | POA: Diagnosis not present

## 2017-09-27 DIAGNOSIS — Z79899 Other long term (current) drug therapy: Secondary | ICD-10-CM | POA: Diagnosis not present

## 2017-09-27 DIAGNOSIS — R0602 Shortness of breath: Secondary | ICD-10-CM | POA: Diagnosis not present

## 2017-09-27 DIAGNOSIS — I10 Essential (primary) hypertension: Secondary | ICD-10-CM | POA: Insufficient documentation

## 2017-09-27 DIAGNOSIS — R0789 Other chest pain: Secondary | ICD-10-CM | POA: Diagnosis not present

## 2017-09-27 DIAGNOSIS — Z87891 Personal history of nicotine dependence: Secondary | ICD-10-CM | POA: Diagnosis not present

## 2017-09-27 DIAGNOSIS — I2510 Atherosclerotic heart disease of native coronary artery without angina pectoris: Secondary | ICD-10-CM | POA: Insufficient documentation

## 2017-09-27 LAB — BASIC METABOLIC PANEL
ANION GAP: 10 (ref 5–15)
BUN: 8 mg/dL (ref 8–23)
CHLORIDE: 101 mmol/L (ref 98–111)
CO2: 24 mmol/L (ref 22–32)
CREATININE: 0.73 mg/dL (ref 0.61–1.24)
Calcium: 9.3 mg/dL (ref 8.9–10.3)
GFR calc Af Amer: >60 mL/min (ref >60)
GFR calc non Af Amer: >60 mL/min (ref >60)
Glucose, Bld: 154 mg/dL — ABNORMAL HIGH (ref 70–99)
Potassium: 4.3 mmol/L (ref 3.5–5.1)
Sodium: 135 mmol/L (ref 135–145)

## 2017-09-27 LAB — TROPONIN I: Troponin I: <0.03 ng/mL (ref <0.03)

## 2017-09-27 LAB — CBC
HCT: 34.5 % — ABNORMAL LOW (ref 39.0–52.0)
Hemoglobin: 10.6 g/dL — ABNORMAL LOW (ref 13.0–17.0)
MCH: 29.9 pg (ref 26.0–34.0)
MCHC: 30.7 g/dL (ref 30.0–36.0)
MCV: 97.5 fL (ref 78.0–100.0)
PLATELETS: 393 10*3/uL (ref 150–400)
RBC: 3.54 MIL/uL — AB (ref 4.22–5.81)
RDW: 13.1 % (ref 11.5–15.5)
WBC: 6.8 10*3/uL (ref 4.0–10.5)

## 2017-09-27 LAB — I-STAT TROPONIN, ED: Troponin i, poc: 0 ng/mL (ref 0.00–0.08)

## 2017-09-27 NOTE — ED Provider Notes (Signed)
[facility] EMERGENCY DEPARTMENT Provider Note   CSN: [id number] Arrival date & time: 09/27/17  1449     History   Chief Complaint Chief Complaint  Patient presents with  . Chest Pain    HPI Phillip [name] is a 72 y.o. male who presents for evaluation of chest pain.  He has a past medical history of known coronary artery disease and previous stenting.  The patient had a nuclear stress test in April 2019 that showed no reversible ischemia.  He did have some apical hypokinesis.  The patient states that he has had some exertional dyspnea but also has a history of smoking but quit many years ago.  Patient states that today he was doing some work in his kitchen when he had onset of retrosternal pressure-like chest pain.  Patient states that it was extremely concerning to him and he took 2 nitroglycerin immediately.  He states he waited about 15 minutes and tried to drink a beer.  Patient states that the beer tasted funny to him and he knew he needed to come to the emergency department. although he had improvement in his pain he still had the pressure-like pain in his chest and drove himself he to to the emergency department so was not to "bother anyone."  Patient states that he no longer has any active chest pain or shortness of breath.  His pain was not worsened with any exertion or movement.  Denies any fevers chills cough nausea diaphoresis shortness of breath.  HPI  Past Medical History:  Diagnosis Date  . Anemia   . Anxiety   . Arthritis   . Bipolar depression ([place])   . CAD, multiple vessel   . Chronic pain disorder    chronic bilat shoulder pain and feet pain  . Depression   . Erectile dysfunction 01/17/2013  . GERD (gastroesophageal reflux disease)   . History of colon polyps   . Hyperlipidemia   . Hypertension   . Insomnia   . Iron deficiency anemia 01/16/2013  . Mixed hyperlipidemia 03/08/2013  . Status post insertion of drug-eluting stent into left anterior  descending (LAD) artery for coronary artery disease   . Stented coronary artery 01/16/2013   X 2  . Systolic murmur   . Unspecified vitamin D deficiency 01/17/2013    Patient Active Problem List   Diagnosis Date Noted  . Chest pain with moderate risk of acute coronary syndrome 06/01/2016  . Cough 06/01/2016  . Allergic rhinitis 06/01/2016  . Pulmonary nodules 06/01/2016  . Other chest pain 12/02/2015  . Left shoulder pain 03/11/2015  . Right foot pain 03/11/2015  . Skin abscess 09/19/2014  . Elevated blood sugar 02/27/2014  . Exertional angina ([place]) 12/17/2013  . Rheumatoid arthritis ([place]) 05/23/2013  . Polyarthralgia 05/15/2013  . Myalgia 05/15/2013  . Mixed hyperlipidemia 03/08/2013  . Bilateral hand pain 03/07/2013  . Left wrist pain 03/07/2013  . Right ankle pain 03/07/2013  . Unspecified vitamin D deficiency 01/17/2013  . Erectile dysfunction 01/17/2013  . Rash and nonspecific skin eruption 01/16/2013  . Encounter for well adult exam with abnormal findings 01/16/2013  . 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 Bilateral hearing loss 01/16/2013  . Stented coronary artery 01/16/2013  . Iron deficiency anemia 01/16/2013  . Arthritis   . Hypertension   . Hyperlipidemia   . Anxiety   . Depression   . CAD -S/P PCI 2008, 2010   . History of colon polyps   . Insomnia   .  Bipolar depression ([place])   . GERD (gastroesophageal reflux disease)   . Chronic pain disorder     Past Surgical History:  Procedure Laterality Date  . CAROTID STENT  2008  . CORONARY ANGIOPLASTY WITH STENT PLACEMENT  about 2014   current Cardiology: Dr Orene Desanctis  . EYE SURGERY          Home Medications    Prior to Admission medications   Medication Sig Start Date End Date Taking? Authorizing Provider  ALPRAZolam Duanne Moron) 1 MG tablet TAKE 1/2 TO 1 TABLET BY MOUTH TWICE DAILY AS NEEDED 08/23/17   Biagio Borg, MD  amLODipine (NORVASC) 2.5 MG tablet TAKE 1 TABLET BY MOUTH ONCE DAILY 09/20/17   Biagio Borg, MD  aspirin 81 MG tablet Take 81 mg by mouth daily.    [provider]  carbamazepine (TEGRETOL) 200 MG tablet TAKE 1 TABLET BY MOUTH TWICE DAILY 02/17/17   Biagio Borg, MD  carvedilol (COREG) 12.5 MG tablet Take 1 tablet (12.5 mg total) by mouth 2 (two) times daily with a meal. 07/05/17   Kilroy, Doreene Burke, PA-C  clobetasol cream (TEMOVATE) 0.05 % APPLY TOPICALLY TWICE DAILY AS NEEDED 03/22/14   Biagio Borg, MD  clopidogrel (PLAVIX) 75 MG tablet TAKE 1 TABLET BY MOUTH DAILY 02/17/17   Biagio Borg, MD  Cyanocobalamin (VITAMIN B 12 PO) Take by mouth.    [provider]  fenofibrate 54 MG tablet TAKE 1 TABLET BY MOUTH DAILY 01/16/17   Biagio Borg, MD  folic acid (FOLVITE) 1 MG tablet TAKE 1 TABLET BY MOUTH DAILY 01/22/14   Biagio Borg, MD  HYDROcodone-acetaminophen (NORCO/VICODIN) 5-325 MG tablet Take 1 tablet by mouth every 6 (six) hours as needed for moderate pain. 11/11/16   Biagio Borg, MD  hydroxychloroquine (PLAQUENIL) 200 MG tablet Take 200 mg by mouth daily.    [provider]  hydrOXYzine (ATARAX/VISTARIL) 25 MG tablet TAKE 1 TABLET BY MOUTH 3 TIMES DAILY AS NEEDED 03/28/17   Biagio Borg, MD  leflunomide (ARAVA) 20 MG tablet Take 1 tablet by mouth daily. 11/29/13   [provider]  nitroGLYCERIN (NITROSTAT) 0.4 MG SL tablet Place 1 tablet (0.4 mg total) under the tongue every 5 (five) minutes as needed for chest pain. 06/16/17   Erlene Quan, PA-C  Omega-3 Fatty Acids (FISH OIL PO) Take by mouth daily.    [provider]  pantoprazole (PROTONIX) 40 MG tablet TAKE 1 TABLET BY MOUTH TWICE DAILY 06/17/17   Biagio Borg, MD  predniSONE (DELTASONE) 10 MG tablet TAKE 1 TABLET BY MOUTH DAILY 07/07/17   Biagio Borg, MD  ramipril (ALTACE) 2.5 MG capsule TAKE 1 CAPSULE BY MOUTH DAILY 02/17/17   Biagio Borg, MD  rosuvastatin (CRESTOR) 20 MG tablet TAKE 1 TABLET BY MOUTH DAILY 02/17/17   Croitoru, Mihai, MD  rosuvastatin (CRESTOR) 20 MG  tablet TAKE 1 TABLET BY MOUTH DAILY 07/25/17   Croitoru, Mihai, MD  sildenafil (REVATIO) 20 MG tablet Take 3 pills daily as needed 01/24/15   Biagio Borg, MD  traZODone (DESYREL) 50 MG tablet TAKE 1/2 TO 1 TABLETS BY MOUTH AT BEDTIME AS NEEDED FOR SLEEP 01/24/15   Biagio Borg, MD  traZODone (DESYREL) 50 MG tablet TAKE 1/2 TO 1 TABLET BY MOUTH EVERY NIGHT AT BEDTIME FOR SLEEP 08/03/16   Biagio Borg, MD  triamcinolone (NASACORT AQ) 55 MCG/ACT AERO nasal inhaler Place 2 sprays into the nose daily.  06/01/16   Biagio Borg, MD    Family History Family History  Problem Relation Age of Onset  . Cancer Mother   . Heart disease Mother   . Heart disease Maternal Grandmother   . Stroke Maternal Grandmother   . Hyperlipidemia Maternal Grandmother   . Cancer Maternal Grandmother   . Lung cancer Father   . Alzheimer's disease Brother   . Cancer Maternal Grandfather   . Heart disease Paternal Grandfather   . Stroke Paternal Grandfather     Social History Social History   Tobacco Use  . Smoking status: Former Smoker    Packs/day: 1.00    Years: 20.00    Pack years: 20.00    Types: Cigarettes    Last attempt to quit: 02/22/2002    Years since quitting: 15.6  . Smokeless tobacco: Current User    Types: Chew  Substance Use Topics  . Alcohol use: Yes  . Drug use: No     Allergies   Lipitor [atorvastatin]   Review of Systems Review of Systems  Ten systems reviewed and are negative for acute change, except as noted in the HPI.   Physical Exam Updated Vital Signs BP (!) 145/61 (BP Location: Right Arm)   Pulse 62   Temp 97.8 F (36.6 C) (Oral)   Resp 17   SpO2 97%   Physical Exam  Constitutional: He is oriented to person, place, and time. He appears well-developed and well-nourished. No distress.  HENT:  Head: Normocephalic and atraumatic.  Eyes: Conjunctivae are normal. No scleral icterus.  Neck: Normal range of motion. Neck supple.  Cardiovascular: Normal rate, regular  rhythm, normal heart sounds, intact distal pulses and normal pulses.  Pulmonary/Chest: Effort normal and breath sounds normal. No respiratory distress. He has no decreased breath sounds.  Abdominal: Soft. There is no tenderness.  Musculoskeletal: He exhibits no edema.  Neurological: He is alert and oriented to person, place, and time.  Skin: Skin is warm and dry. He is not diaphoretic.  Psychiatric: His behavior is normal.  Nursing note and vitals reviewed.    ED Treatments / Results  Labs (all labs ordered are listed, but only abnormal results are displayed) Labs Reviewed  BASIC METABOLIC PANEL - Abnormal; Notable for the following components:      Result Value   Glucose, Bld 154 (*)    All other components within normal limits  CBC - Abnormal; Notable for the following components:   RBC 3.54 (*)    Hemoglobin 10.6 (*)    HCT 34.5 (*)    All other components within normal limits  I-STAT TROPONIN, ED    EKG EKG Interpretation  Date/Time:  Tuesday September 27 2017 15:02:44 EDT Ventricular Rate:  63 PR Interval:  196 QRS Duration: 92 QT Interval:  402 QTC Calculation: 411 R Axis:   12 Text Interpretation:  Normal sinus rhythm Nonspecific ST abnormality Abnormal ECG Confirmed by Gerlene Fee [id number]) on 09/27/2017 5:26:23 PM   Radiology Dg Chest 2 View  Result Date: 09/27/2017 CLINICAL DATA:  Shortness of breath and mid chest pain began today. Pain was not relieved with nitroglycerin but the pain has subsided to a dull ache. Former smoker. History of coronary artery disease with stent placement. EXAM: CHEST - 2 VIEW COMPARISON:  Chest x-ray of June 17, 2008 and CT scan of the chest of June 14, 2016 FINDINGS: The lungs are adequately inflated. The interstitial markings are coarse though stable. There is no alveolar infiltrate or pleural  effusion. The heart and pulmonary vascularity are normal. There is calcification in the wall of the aortic arch. There is gentle curvature of the  thoracic spine convex toward the right centered at approximately T 10 IMPRESSION: Mild chronic bronchitic changes.  No alveolar pneumonia nor CHF. Thoracic aortic atherosclerosis. Electronically Signed   By: David  Martinique M.D.   On: 09/27/2017 15:40    Procedures Procedures (including critical care time)  Medications Ordered in ED Medications - No data to display   Initial Impression / Assessment and Plan / ED Course  I have reviewed the triage vital signs and the nursing notes.  Pertinent labs & imaging results that were available during my care of the patient were reviewed by me and considered in my medical decision making (see chart for details).     Patient EKG, chest x-ray and labs reviewed.  I discussed the case with the Dr. Marigene Ehlers who is the cards fellow.  We reviewed the patient's history, medications, EKG and feel that this likely does not represent ACS however will need a repeat troponin IN the patient is advised to follow-up with his cardiologist and call the office for close follow-up tomorrow morning.  I have given sign out to PA [place] who will assume care of the patient for disposition.  Final Clinical Impressions(s) / ED Diagnoses   Final diagnoses:  None    ED Discharge Orders    None       Margarita Mail, PA-C 09/27/17 2248    Maudie Flakes, MD 09/27/17 2251

## 2017-09-27 NOTE — ED Provider Notes (Signed)
Assumed care from PA Harris as shift change.  See prior notes for full H&P.  Briefly, 72 y.o. M here with episode of chest pain.  Took NTG ans washed it down with a beer.  States pain started to feel better but decided to come in for evaluation.  Recent negative stress test.  No cough, fever, shortness of breath.  Work-up thus far reassuring.  Plan:  Cardiology was consulted already-- per their recommendations, if delta trop remains flat, can d/c home to follow-up with cardiology clinic in the morning.  Results for orders placed or performed during the hospital encounter of 16/10/96  Basic metabolic panel  Result Value Ref Range   Sodium 135 135 - 145 mmol/L   Potassium 4.3 3.5 - 5.1 mmol/L   Chloride 101 98 - 111 mmol/L   CO2 24 22 - 32 mmol/L   Glucose, Bld 154 (H) 70 - 99 mg/dL   BUN 8 8 - 23 mg/dL   Creatinine, Ser 0.73 0.61 - 1.24 mg/dL   Calcium 9.3 8.9 - 10.3 mg/dL   GFR calc non Af Amer >60 >60 mL/min   GFR calc Af Amer >60 >60 mL/min   Anion gap 10 5 - 15  CBC  Result Value Ref Range   WBC 6.8 4.0 - 10.5 K/uL   RBC 3.54 (L) 4.22 - 5.81 MIL/uL   Hemoglobin 10.6 (L) 13.0 - 17.0 g/dL   HCT 34.5 (L) 39.0 - 52.0 %   MCV 97.5 78.0 - 100.0 fL   MCH 29.9 26.0 - 34.0 pg   MCHC 30.7 30.0 - 36.0 g/dL   RDW 13.1 11.5 - 15.5 %   Platelets 393 150 - 400 K/uL  Troponin I  Result Value Ref Range   Troponin I <0.03 <0.03 ng/mL  I-stat troponin, ED  Result Value Ref Range   Troponin i, poc 0.00 0.00 - 0.08 ng/mL   Comment 3           Dg Chest 2 View  Result Date: 09/27/2017 CLINICAL DATA:  Shortness of breath and mid chest pain began today. Pain was not relieved with nitroglycerin but the pain has subsided to a dull ache. Former smoker. History of coronary artery disease with stent placement. EXAM: CHEST - 2 VIEW COMPARISON:  Chest x-ray of June 17, 2008 and CT scan of the chest of June 14, 2016 FINDINGS: The lungs are adequately inflated. The interstitial markings are coarse though  stable. There is no alveolar infiltrate or pleural effusion. The heart and pulmonary vascularity are normal. There is calcification in the wall of the aortic arch. There is gentle curvature of the thoracic spine convex toward the right centered at approximately T 10 IMPRESSION: Mild chronic bronchitic changes.  No alveolar pneumonia nor CHF. Thoracic aortic atherosclerosis. Electronically Signed   By: David  Martinique M.D.   On: 09/27/2017 15:40    11:20 PM Delta trop negative.  Patient CP free at this time.  VSS. He is comfortable with discharge home and will follow-up in cardiology clinic.  He understands to return here for any new/acute changes.   Larene Pickett, PA-C 09/27/17 2320    Merryl Hacker, MD 09/28/17 [place]

## 2017-09-27 NOTE — Discharge Instructions (Signed)
Follow-up with the cardiology clinic-- call in the morning to make appt. Please return here for any new/acute changes-- recurrent pain, shortness of breath, dizziness, weakness, etc.

## 2017-09-27 NOTE — ED Triage Notes (Signed)
Pt. Stated, I started having chest pain a couple of hours ago , its like a tightness, I was just sitting there. I took one NITRO helped some but I can still feel it.

## 2017-10-20 ENCOUNTER — Other Ambulatory Visit: Payer: Self-pay | Admitting: Internal Medicine

## 2017-11-17 ENCOUNTER — Other Ambulatory Visit (INDEPENDENT_AMBULATORY_CARE_PROVIDER_SITE_OTHER): Payer: Medicare Other

## 2017-11-17 ENCOUNTER — Encounter: Payer: Self-pay | Admitting: Internal Medicine

## 2017-11-17 ENCOUNTER — Other Ambulatory Visit: Payer: Self-pay | Admitting: Internal Medicine

## 2017-11-17 ENCOUNTER — Ambulatory Visit: Payer: Medicare Other | Admitting: Internal Medicine

## 2017-11-17 VITALS — BP 140/70 | HR 57 | Temp 97.7°F | Ht 70.0 in | Wt 165.0 lb

## 2017-11-17 DIAGNOSIS — Z0000 Encounter for general adult medical examination without abnormal findings: Secondary | ICD-10-CM

## 2017-11-17 DIAGNOSIS — R739 Hyperglycemia, unspecified: Secondary | ICD-10-CM | POA: Diagnosis not present

## 2017-11-17 DIAGNOSIS — Z23 Encounter for immunization: Secondary | ICD-10-CM

## 2017-11-17 LAB — LIPID PANEL
CHOLESTEROL: 148 mg/dL (ref 0–200)
HDL: 64.8 mg/dL (ref >39.00)
LDL Cholesterol: 68 mg/dL (ref 0–99)
NonHDL: 83.03
TRIGLYCERIDES: 76 mg/dL (ref 0.0–149.0)
Total CHOL/HDL Ratio: 2
VLDL: 15.2 mg/dL (ref 0.0–40.0)

## 2017-11-17 LAB — HEPATIC FUNCTION PANEL
ALBUMIN: 4.3 g/dL (ref 3.5–5.2)
ALT: 15 U/L (ref 0–53)
AST: 13 U/L (ref 0–37)
Alkaline Phosphatase: 47 U/L (ref 39–117)
Bilirubin, Direct: 0.1 mg/dL (ref 0.0–0.3)
Total Bilirubin: 0.3 mg/dL (ref 0.2–1.2)
Total Protein: 7 g/dL (ref 6.0–8.3)

## 2017-11-17 LAB — URINALYSIS, ROUTINE W REFLEX MICROSCOPIC
BILIRUBIN URINE: NEGATIVE
HGB URINE DIPSTICK: NEGATIVE
Ketones, ur: NEGATIVE
Leukocytes, UA: NEGATIVE
Nitrite: NEGATIVE
Specific Gravity, Urine: 1.015 (ref 1.000–1.030)
TOTAL PROTEIN, URINE-UPE24: NEGATIVE
UROBILINOGEN UA: 0.2 (ref 0.0–1.0)
Urine Glucose: NEGATIVE
WBC UA: NONE SEEN — AB (ref 0–?)
pH: 5.5 (ref 5.0–8.0)

## 2017-11-17 LAB — CBC WITH DIFFERENTIAL/PLATELET
Basophils Absolute: 0.1 10*3/uL (ref 0.0–0.1)
Basophils Relative: 1.1 % (ref 0.0–3.0)
EOS PCT: 2 % (ref 0.0–5.0)
Eosinophils Absolute: 0.1 10*3/uL (ref 0.0–0.7)
HEMATOCRIT: 36.6 % — AB (ref 39.0–52.0)
Hemoglobin: 12.3 g/dL — ABNORMAL LOW (ref 13.0–17.0)
Lymphocytes Relative: 29.5 % (ref 12.0–46.0)
Lymphs Abs: 1.5 10*3/uL (ref 0.7–4.0)
MCHC: 33.6 g/dL (ref 30.0–36.0)
MCV: 88.3 fl (ref 78.0–100.0)
Monocytes Absolute: 0.6 10*3/uL (ref 0.1–1.0)
Monocytes Relative: 11 % (ref 3.0–12.0)
Neutro Abs: 2.9 10*3/uL (ref 1.4–7.7)
Neutrophils Relative %: 56.4 % (ref 43.0–77.0)
Platelets: 296 10*3/uL (ref 150.0–400.0)
RBC: 4.14 Mil/uL — AB (ref 4.22–5.81)
RDW: 12.3 % (ref 11.5–15.5)
WBC: 5.2 10*3/uL (ref 4.0–10.5)

## 2017-11-17 LAB — HEMOGLOBIN A1C: Hgb A1c MFr Bld: 6.8 % — ABNORMAL HIGH (ref 4.6–6.5)

## 2017-11-17 LAB — BASIC METABOLIC PANEL
BUN: 10 mg/dL (ref 6–23)
CO2: 30 meq/L (ref 19–32)
Calcium: 9.3 mg/dL (ref 8.4–10.5)
Chloride: 98 mEq/L (ref 96–112)
Creatinine, Ser: 0.75 mg/dL (ref 0.40–1.50)
GFR: 108.63 mL/min (ref >60.00)
GLUCOSE: 129 mg/dL — AB (ref 70–99)
POTASSIUM: 4.1 meq/L (ref 3.5–5.1)
SODIUM: 136 meq/L (ref 135–145)

## 2017-11-17 LAB — TSH: TSH: 1.44 u[IU]/mL (ref 0.35–4.50)

## 2017-11-17 LAB — PSA: PSA: 0.6 ng/mL (ref 0.10–4.00)

## 2017-11-17 MED ORDER — ALPRAZOLAM 1 MG PO TABS: ORAL_TABLET | ORAL | 2 refills | Status: DC

## 2017-11-17 NOTE — Patient Instructions (Signed)
You had the flu shot today, and Pneumovax pneumonia shot today  Please continue all other medications as before, and refills have been done if requested.  Please have the pharmacy call with any other refills you may need.  Please continue your efforts at being more active, low cholesterol diet, and weight control.  You are otherwise up to date with prevention measures today.  Please keep your appointments with your specialists as you may have planned  Please go to the LAB in the Basement (turn left off the elevator) for the tests to be done today  You will be contacted by phone if any changes need to be made immediately.  Otherwise, you will receive a letter about your results with an explanation, but please check with MyChart first.  Please remember to sign up for MyChart if you have not done so, as this will be important to you in the future with finding out test results, communicating by private email, and scheduling acute appointments online when needed.  Please return in 6 months, or sooner if needed

## 2017-11-17 NOTE — Telephone Encounter (Signed)
Done erx 

## 2017-11-17 NOTE — Progress Notes (Signed)
Subjective:    Patient ID: Phillip [name], male    DOB: Aug 14, 1945, 72 y.o.   MRN: [id number]  HPI  Here for wellness and f/u;  Overall doing ok;  Pt denies Chest pain, worsening SOB, DOE, wheezing, orthopnea, PND, worsening LE edema, palpitations, dizziness or syncope.  Pt denies neurological change such as new headache, facial or extremity weakness.  Pt denies polydipsia, polyuria, or low sugar symptoms. Pt states overall good compliance with treatment and medications, good tolerability, and has been trying to follow appropriate diet.  Pt denies worsening depressive symptoms, suicidal ideation or panic. No fever, night sweats, wt loss, loss of appetite, or other constitutional symptoms.  Pt states good ability with ADL's, has low fall risk, home safety reviewed and adequate, no other significant changes in hearing or vision, and only occasionally active with exercise.  No nbew complaints Past Medical History:  Diagnosis Date  . Anemia   . Anxiety   . Arthritis   . Bipolar depression ([place])   . CAD, multiple vessel   . Chronic pain disorder    chronic bilat shoulder pain and feet pain  . Depression   . Erectile dysfunction 01/17/2013  . GERD (gastroesophageal reflux disease)   . History of colon polyps   . Hyperlipidemia   . Hypertension   . Insomnia   . Iron deficiency anemia 01/16/2013  . Mixed hyperlipidemia 03/08/2013  . Status post insertion of drug-eluting stent into left anterior descending (LAD) artery for coronary artery disease   . Stented coronary artery 01/16/2013   X 2  . Systolic murmur   . Unspecified vitamin D deficiency 01/17/2013   Past Surgical History:  Procedure Laterality Date  . CAROTID STENT  2008  . CORONARY ANGIOPLASTY WITH STENT PLACEMENT  about 2014   current Cardiology: Dr Orene Desanctis  . EYE SURGERY      reports that he quit smoking about 15 years ago. His smoking use included cigarettes. He has a 20.00 pack-year smoking history. His smokeless tobacco  use includes chew. He reports that he drinks alcohol. He reports that he does not use drugs. family history includes Alzheimer's disease in his brother; Cancer in his maternal grandfather, maternal grandmother, and mother; Heart disease in his maternal grandmother, mother, and paternal grandfather; Hyperlipidemia in his maternal grandmother; Lung cancer in his father; Stroke in his maternal grandmother and paternal grandfather. Allergies  Allergen Reactions  . Lipitor [Atorvastatin] Other (See Comments)    myalgia   Current Outpatient Medications on File Prior to Visit  Medication Sig Dispense Refill  . amLODipine (NORVASC) 2.5 MG tablet TAKE 1 TABLET BY MOUTH ONCE DAILY 90 tablet 2  . aspirin 81 MG tablet Take 81 mg by mouth daily.    . carbamazepine (TEGRETOL) 200 MG tablet TAKE 1 TABLET BY MOUTH TWICE DAILY (Patient taking differently: TAKE 100MG  BY MOUTH TWICE DAILY) 180 tablet 2  . carvedilol (COREG) 12.5 MG tablet Take 1 tablet (12.5 mg total) by mouth 2 (two) times daily with a meal. (Patient taking differently: Take 6.25 mg by mouth 2 (two) times daily with a meal. )    . clobetasol cream (TEMOVATE) 0.05 % APPLY TOPICALLY TWICE DAILY AS NEEDED 60 g 1  . clopidogrel (PLAVIX) 75 MG tablet TAKE 1 TABLET BY MOUTH DAILY 90 tablet 2  . Cyanocobalamin (VITAMIN B 12 PO) Take by mouth.    . fenofibrate 54 MG tablet TAKE 1 TABLET BY MOUTH ONCE DAILY 90 tablet 2  . folic acid (  FOLVITE) 1 MG tablet TAKE 1 TABLET BY MOUTH DAILY 90 tablet 0  . HYDROcodone-acetaminophen (NORCO/VICODIN) 5-325 MG tablet Take 1 tablet by mouth every 6 (six) hours as needed for moderate pain. 60 tablet 0  . hydroxychloroquine (PLAQUENIL) 200 MG tablet Take 100 mg by mouth daily.     . hydrOXYzine (ATARAX/VISTARIL) 25 MG tablet TAKE 1 TABLET BY MOUTH 3 TIMES DAILY AS NEEDED 270 tablet 2  . leflunomide (ARAVA) 20 MG tablet Take 10 mg by mouth 2 (two) times daily.     . nitroGLYCERIN (NITROSTAT) 0.4 MG SL tablet Place 1  tablet (0.4 mg total) under the tongue every 5 (five) minutes as needed for chest pain. 25 tablet 3  . Omega-3 Fatty Acids (FISH OIL PO) Take by mouth daily.    . pantoprazole (PROTONIX) 40 MG tablet TAKE 1 TABLET BY MOUTH TWICE DAILY 180 tablet 2  . predniSONE (DELTASONE) 10 MG tablet TAKE 1 TABLET BY MOUTH DAILY 90 tablet 1  . rosuvastatin (CRESTOR) 20 MG tablet TAKE 1 TABLET BY MOUTH DAILY 30 tablet 0  . sildenafil (REVATIO) 20 MG tablet Take 3 pills daily as needed 60 tablet 2  . traZODone (DESYREL) 50 MG tablet TAKE 1/2 TO 1 TABLETS BY MOUTH AT BEDTIME AS NEEDED FOR SLEEP (Patient taking differently: Take 50 mg by mouth at bedtime. ) 90 tablet 5  . triamcinolone (NASACORT AQ) 55 MCG/ACT AERO nasal inhaler Place 2 sprays into the nose daily. 1 Inhaler 12   Current Facility-Administered Medications on File Prior to Visit  Medication Dose Route Frequency Provider Last Rate Last Dose  . triamcinolone acetonide (KENALOG) 10 MG/ML injection 10 mg  10 mg Other Once Landis Martins, DPM       Review of Systems Constitutional: Negative for other unusual diaphoresis, sweats, appetite or weight changes HENT: Negative for other worsening hearing loss, ear pain, facial swelling, mouth sores or neck stiffness.   Eyes: Negative for other worsening pain, redness or other visual disturbance.  Respiratory: Negative for other stridor or swelling Cardiovascular: Negative for other palpitations or other chest pain  Gastrointestinal: Negative for worsening diarrhea or loose stools, blood in stool, distention or other pain Genitourinary: Negative for hematuria, flank pain or other change in urine volume.  Musculoskeletal: Negative for myalgias or other joint swelling.  Skin: Negative for other color change, or other wound or worsening drainage.  Neurological: Negative for other syncope or numbness. Hematological: Negative for other adenopathy or swelling Psychiatric/Behavioral: Negative for hallucinations,  other worsening agitation, SI, self-injury, or new decreased concentration All other system neg per pt    Objective:   Physical Exam BP 140/70 (BP Location: Left Arm, Patient Position: Sitting, Cuff Size: Normal)   Pulse (!) 57   Temp 97.7 F (36.5 C) (Oral)   Ht 5\' 10"  (1.778 m)   Wt 165 lb (74.8 kg)   SpO2 98%   BMI 23.68 kg/m  VS noted,  Constitutional: Pt is oriented to person, place, and time. Appears well-developed and well-nourished, in no significant distress and comfortable Head: Normocephalic and atraumatic  Eyes: Conjunctivae and EOM are normal. Pupils are equal, round, and reactive to light Right Ear: External ear normal without discharge Left Ear: External ear normal without discharge Nose: Nose without discharge or deformity Mouth/Throat: Oropharynx is without other ulcerations and moist  Neck: Normal range of motion. Neck supple. No JVD present. No tracheal deviation present or significant neck LA or mass Cardiovascular: Normal rate, regular rhythm, normal heart sounds and  intact distal pulses.   Pulmonary/Chest: WOB normal and breath sounds without rales or wheezing  Abdominal: Soft. Bowel sounds are normal. NT. No HSM  Musculoskeletal: Normal range of motion. Exhibits no edema Lymphadenopathy: Has no other cervical adenopathy.  Neurological: Pt is alert and oriented to person, place, and time. Pt has normal reflexes. No cranial nerve deficit. Motor grossly intact, Gait intact Skin: Skin is warm and dry. No rash noted or new ulcerations Psychiatric:  Has normal mood and affect. Behavior is normal without agitation No other exam findings    Assessment & Plan:

## 2017-11-18 ENCOUNTER — Encounter: Payer: Self-pay | Admitting: Internal Medicine

## 2017-11-18 NOTE — Telephone Encounter (Signed)
MD approved and sent electronically to pof../lmb  

## 2017-11-18 NOTE — Assessment & Plan Note (Signed)

## 2017-11-21 ENCOUNTER — Ambulatory Visit: Payer: Medicare Other | Admitting: Cardiovascular Disease

## 2017-11-21 ENCOUNTER — Encounter

## 2017-11-21 ENCOUNTER — Encounter: Payer: Self-pay | Admitting: Cardiovascular Disease

## 2017-11-21 VITALS — BP 126/74 | HR 63 | Ht 70.0 in | Wt 165.0 lb

## 2017-11-21 DIAGNOSIS — I10 Essential (primary) hypertension: Secondary | ICD-10-CM

## 2017-11-21 DIAGNOSIS — E7800 Pure hypercholesterolemia, unspecified: Secondary | ICD-10-CM

## 2017-11-21 DIAGNOSIS — I25118 Atherosclerotic heart disease of native coronary artery with other forms of angina pectoris: Secondary | ICD-10-CM

## 2017-11-21 NOTE — Patient Instructions (Signed)
Dr Sallyanne Kuster has recommended making the following medication changes: 1. STOP Fenofibrate  Your physician recommends that you schedule a follow-up appointment in JULY 2020.  If you need a refill on your cardiac medications before your next appointment, please call your pharmacy.

## 2017-11-21 NOTE — Progress Notes (Signed)
Patient ID: Phillip [name], male   DOB: [birth year], 72 y.o.   MRN: [id number]    Cardiology Office Note    Date:  11/26/2017   ID:  Phillip [name], DOB 1945/08/15, MRN [id number]  PCP:  Biagio Borg, MD  Cardiologist:   Sanda Klein, MD   Chief Complaint  Patient presents with  . Coronary Artery Disease    History of Present Illness:  Phillip [name] is a 72 y.o. male with recent chest discomfort, known CAD, HTN and hyperlipidemia.  He is doing well.  He had one episode of chest pain on August 6, resolved with a single nitroglycerin.  Cardiac troponin was normal.  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  The patient specifically denies any other episodes of chest pain at rest or exertion, dyspnea at rest or with exertion, orthopnea, paroxysmal nocturnal dyspnea, syncope, palpitations, focal neurological deficits, intermittent claudication, lower extremity edema, unexplained weight gain, cough, hemoptysis or wheezing. Marland Kitchen  He received drug-eluting stents in 2008 (3.028 drug-eluting Cypher to proximal ramus intermedius) and 2010 (2.513 drug-eluting Cypher to OM 1 branch of left circumflex; IVUS showed 40% ostial left main, 60% proximal LAD, 50% mid LAD) and had a normal perfusion study in Nov 2015. He has normal left ventricular systolic function with an ejection fraction of 55% by scintigraphy in 2012. Intolerant to multiple statins in the past, currently tolerating Crestor.   Past Medical History:  Diagnosis Date  . Anemia   . Anxiety   . Arthritis   . Bipolar depression ([place])   . CAD, multiple vessel   . Chronic pain disorder    chronic bilat shoulder pain and feet pain  . Depression   . Erectile dysfunction 01/17/2013  . GERD (gastroesophageal reflux disease)   . History of colon polyps   . Hyperlipidemia   . Hypertension   . Insomnia   . Iron deficiency anemia 01/16/2013  . Mixed hyperlipidemia 03/08/2013  . Status  post insertion of drug-eluting stent into left anterior descending (LAD) artery for coronary artery disease   . Stented coronary artery 01/16/2013   X 2  . Systolic murmur   . Unspecified vitamin D deficiency 01/17/2013    Past Surgical History:  Procedure Laterality Date  . CAROTID STENT  2008  . CORONARY ANGIOPLASTY WITH STENT PLACEMENT  about 2014   current Cardiology: Dr Orene Desanctis  . EYE SURGERY      Outpatient Medications Prior to Visit  Medication Sig Dispense Refill  . ALPRAZolam (XANAX) 1 MG tablet TAKE 1/2 TO 1 TABLET BY MOUTH TWICE DAILY AS NEEDED 60 tablet 2  . amLODipine (NORVASC) 2.5 MG tablet TAKE 1 TABLET BY MOUTH ONCE DAILY 90 tablet 2  . aspirin 81 MG tablet Take 81 mg by mouth daily.    . carbamazepine (TEGRETOL) 200 MG tablet TAKE 1 TABLET BY MOUTH TWICE DAILY (Patient taking differently: TAKE 100MG  BY MOUTH TWICE DAILY) 180 tablet 2  . carvedilol (COREG) 12.5 MG tablet Take 1 tablet (12.5 mg total) by mouth 2 (two) times daily with a meal. (Patient taking differently: Take 6.25 mg by mouth 2 (two) times daily with a meal. )    . clobetasol cream (TEMOVATE) 0.05 % APPLY TOPICALLY TWICE DAILY AS NEEDED 60 g 1  . clopidogrel (PLAVIX) 75 MG tablet TAKE 1 TABLET BY MOUTH DAILY 90 tablet 2  . Cyanocobalamin (VITAMIN B 12 PO) Take by mouth.    Marland Kitchen  folic acid (FOLVITE) 1 MG tablet TAKE 1 TABLET BY MOUTH DAILY 90 tablet 0  . HYDROcodone-acetaminophen (NORCO/VICODIN) 5-325 MG tablet Take 1 tablet by mouth every 6 (six) hours as needed for moderate pain. 60 tablet 0  . hydroxychloroquine (PLAQUENIL) 200 MG tablet Take 100 mg by mouth daily.     . hydrOXYzine (ATARAX/VISTARIL) 25 MG tablet TAKE 1 TABLET BY MOUTH 3 TIMES DAILY AS NEEDED 270 tablet 2  . leflunomide (ARAVA) 20 MG tablet Take 10 mg by mouth 2 (two) times daily.     . nitroGLYCERIN (NITROSTAT) 0.4 MG SL tablet Place 1 tablet (0.4 mg total) under the tongue every 5 (five) minutes as needed for chest pain. 25 tablet 3  .  Omega-3 Fatty Acids (FISH OIL PO) Take by mouth daily.    . pantoprazole (PROTONIX) 40 MG tablet TAKE 1 TABLET BY MOUTH TWICE DAILY 180 tablet 2  . predniSONE (DELTASONE) 10 MG tablet TAKE 1 TABLET BY MOUTH DAILY 90 tablet 1  . ramipril (ALTACE) 2.5 MG capsule TAKE 1 CAPSULE BY MOUTH DAILY 90 capsule 3  . rosuvastatin (CRESTOR) 20 MG tablet TAKE 1 TABLET BY MOUTH DAILY 30 tablet 0  . sildenafil (REVATIO) 20 MG tablet Take 3 pills daily as needed 60 tablet 2  . traZODone (DESYREL) 50 MG tablet TAKE 1/2 TO 1 TABLETS BY MOUTH AT BEDTIME AS NEEDED FOR SLEEP (Patient taking differently: Take 50 mg by mouth at bedtime. ) 90 tablet 5  . triamcinolone (NASACORT AQ) 55 MCG/ACT AERO nasal inhaler Place 2 sprays into the nose daily. 1 Inhaler 12  . fenofibrate 54 MG tablet TAKE 1 TABLET BY MOUTH ONCE DAILY 90 tablet 2   Facility-Administered Medications Prior to Visit  Medication Dose Route Frequency Provider Last Rate Last Dose  . triamcinolone acetonide (KENALOG) 10 MG/ML injection 10 mg  10 mg Other Once Landis Martins, DPM         Allergies:   Lipitor [atorvastatin]   Social History   Socioeconomic History  . Marital status: Single    Spouse name: Not on file  . Number of children: Not on file  . Years of education: Not on file  . Highest education level: Not on file  Occupational History  . Not on file  Social Needs  . Financial resource strain: Not on file  . Food insecurity:    Worry: Not on file    Inability: Not on file  . Transportation needs:    Medical: Not on file    Non-medical: Not on file  Tobacco Use  . Smoking status: Former Smoker    Packs/day: 1.00    Years: 20.00    Pack years: 20.00    Types: Cigarettes    Last attempt to quit: 02/22/2002    Years since quitting: 15.7  . Smokeless tobacco: Current User    Types: Chew  Substance and Sexual Activity  . Alcohol use: Yes  . Drug use: No  . Sexual activity: Not on file  Lifestyle  . Physical activity:    Days  per week: Not on file    Minutes per session: Not on file  . Stress: Not on file  Relationships  . Social connections:    Talks on phone: Not on file    Gets together: Not on file    Attends religious service: Not on file    Active member of club or organization: Not on file    Attends meetings of clubs or organizations: Not  on file    Relationship status: Not on file  Other Topics Concern  . Not on file  Social History Narrative  . Not on file     Family History:  The patient's family history includes Alzheimer's disease in his brother; Cancer in his maternal grandfather, maternal grandmother, and mother; Heart disease in his maternal grandmother, mother, and paternal grandfather; Hyperlipidemia in his maternal grandmother; Lung cancer in his father; Stroke in his maternal grandmother and paternal grandfather.   ROS:   Please see the history of present illness.    ROS All other systems reviewed and are negative  PHYSICAL EXAM:   VS:  BP 126/74 (BP Location: Left Arm, Patient Position: Sitting, Cuff Size: Normal)   Pulse 63   Ht 5\' 10"  (1.778 m)   Wt 165 lb (74.8 kg)   BMI 23.68 kg/m      General: Alert, oriented x3, no distress, lean, appears fairly fit Head: no evidence of trauma, PERRL, EOMI, no exophtalmos or lid lag, no myxedema, no xanthelasma; normal ears, nose and oropharynx Neck: normal jugular venous pulsations and no hepatojugular reflux; brisk carotid pulses without delay and no carotid bruits Chest: clear to auscultation, no signs of consolidation by percussion or palpation, normal fremitus, symmetrical and full respiratory excursions Cardiovascular: normal position and quality of the apical impulse, regular rhythm, normal first and second heart sounds, no murmurs, rubs or gallops Abdomen: no tenderness or distention, no masses by palpation, no abnormal pulsatility or arterial bruits, normal bowel sounds, no hepatosplenomegaly Extremities: no clubbing, cyanosis or  edema; 2+ radial, ulnar and brachial pulses bilaterally; 2+ right femoral, posterior tibial and dorsalis pedis pulses; 2+ left femoral, posterior tibial and dorsalis pedis pulses; no subclavian or femoral bruits Neurological: grossly nonfocal Psych: Normal mood and affect   Wt Readings from Last 3 Encounters:  11/21/17 165 lb (74.8 kg)  11/17/17 165 lb (74.8 kg)  09/08/17 163 lb 12.8 oz (74.3 kg)      Studies/Labs Reviewed:   EKG:  EKG is not ordered today.  09/27/2017 ECG shows normal sinus rhythm with nonspecific changes in leads I and aVL  Recent Labs: 11/17/2017: ALT 15; BUN 10; Creatinine, Ser 0.75; Hemoglobin 12.3; Platelets 296.0; Potassium 4.1; Sodium 136; TSH 1.44   Lipid Panel    Component Value Date/Time   CHOL 148 11/17/2017 0940   TRIG 76.0 11/17/2017 0940   HDL 64.80 11/17/2017 0940   CHOLHDL 2 11/17/2017 0940   VLDL 15.2 11/17/2017 0940   LDLCALC 68 11/17/2017 0940   LDLDIRECT 142.5 02/27/2014 1013    ASSESSMENT:    1. Coronary artery disease involving native coronary artery of native heart with other form of angina pectoris ([place])   2. Essential hypertension   3. Hypercholesteremia      PLAN:  In order of problems listed above:  1. CAD: Low risk nuclear stress test a few months ago, but has infrequent episodes of angina.   he is on 2 antianginals, amlodipine and carvedilol.  Continue conservative management with aspirin, statin, clopidogrel, beta-blocker. 2. HTN: Well-controlled 3. HLP: LDL cholesterol at target <70, on statin. TG are low, stop fibrate.    Medication Adjustments/Labs and Tests Ordered: Current medicines are reviewed at length with the patient today.  Concerns regarding medicines are outlined above.  Medication changes, Labs and Tests ordered today are listed in the Patient Instructions below. Patient Instructions  Dr Sallyanne Kuster has recommended making the following medication changes: 1. STOP Fenofibrate  Your physician recommends that  you schedule a follow-up appointment in JULY 2020.  If you need a refill on your cardiac medications before your next appointment, please call your pharmacy.      Signed, Sanda Klein, MD  11/26/2017 3:46 PM    [address], [place], [place]  [address] Phone: [phone]; Fax: [phone]

## 2017-11-23 ENCOUNTER — Other Ambulatory Visit: Payer: Self-pay | Admitting: Internal Medicine

## 2017-11-26 ENCOUNTER — Encounter: Payer: Self-pay | Admitting: Cardiovascular Disease

## 2017-12-05 ENCOUNTER — Other Ambulatory Visit: Payer: Self-pay

## 2017-12-05 MED ORDER — HYDROXYCHLOROQUINE SULFATE 200 MG PO TABS: 100.0000 mg | ORAL_TABLET | Freq: Every day | ORAL | 1 refills | Status: DC

## 2017-12-19 ENCOUNTER — Other Ambulatory Visit: Payer: Self-pay | Admitting: Internal Medicine

## 2017-12-21 ENCOUNTER — Telehealth: Payer: Self-pay | Admitting: Internal Medicine

## 2017-12-21 NOTE — Telephone Encounter (Signed)
Needs an appointment or he can call his dentist.

## 2017-12-21 NOTE — Telephone Encounter (Signed)
Copied from [place] [id number]. Topic: Quick Communication - Rx Refill/Question >> Dec 21, 2017 10:27 AM Marin Olp L wrote: Medication: amoxicillin (has an infected tooth a and prefers not to come in for an appointment)  Has the patient contacted their pharmacy? No. (Agent: If no, request that the patient contact the pharmacy for the refill.) (Agent: If yes, when and what did the pharmacy advise?)   Preferred Pharmacy (with phone number or street name): [facility] - [place] - [address] [place] [address] Phone: [phone] Fax: [phone]  Agent: Please be advised that RX refills may take up to 3 business days. We ask that you follow-up with your pharmacy.

## 2017-12-21 NOTE — Telephone Encounter (Signed)
Called pt, LVM.   

## 2018-01-11 ENCOUNTER — Telehealth: Payer: Self-pay

## 2018-01-11 DIAGNOSIS — M150 Primary generalized (osteo)arthritis: Secondary | ICD-10-CM | POA: Diagnosis not present

## 2018-01-11 DIAGNOSIS — M2550 Pain in unspecified joint: Secondary | ICD-10-CM | POA: Diagnosis not present

## 2018-01-11 DIAGNOSIS — M0579 Rheumatoid arthritis with rheumatoid factor of multiple sites without organ or systems involvement: Secondary | ICD-10-CM | POA: Diagnosis not present

## 2018-01-11 DIAGNOSIS — Z6824 Body mass index (BMI) 24.0-24.9, adult: Secondary | ICD-10-CM | POA: Diagnosis not present

## 2018-01-11 NOTE — Telephone Encounter (Signed)
Dr. Jenny Reichmann I see this medication is still listed in the patients chart. Can you please verify that he is still on this medication before I reach out to the pharmacy? Thank you!  Copied from [place] [id number]. Topic: General - Other >> Jan 11, 2018 10:08 AM Janace Aris A wrote: Reason for CRM: The pharmacy called in to get clarification on the medication ramipril (ALTACE) 2.5 MG capsule, they wanted to know if this drug is still an active medication, or has it been discontinued.   They would like a call back regarding this inquiry.

## 2018-01-11 NOTE — Telephone Encounter (Signed)
Yes, this should be continued. 

## 2018-01-12 NOTE — Telephone Encounter (Signed)
Pharmacy notified.

## 2018-01-17 NOTE — Telephone Encounter (Signed)
The Pharmacist  from [facility]  called back and states they have not received a call back in regards to  ramipril (ALTACE) 2.5 MG capsule    she would like a call at [phone]

## 2018-02-01 ENCOUNTER — Other Ambulatory Visit: Payer: Self-pay | Admitting: Cardiovascular Disease

## 2018-02-01 DIAGNOSIS — E785 Hyperlipidemia, unspecified: Secondary | ICD-10-CM

## 2018-02-13 ENCOUNTER — Other Ambulatory Visit: Payer: Self-pay | Admitting: Internal Medicine

## 2018-02-13 NOTE — Telephone Encounter (Signed)
Done erx 

## 2018-03-02 ENCOUNTER — Telehealth: Payer: Self-pay | Admitting: Internal Medicine

## 2018-03-02 DIAGNOSIS — Z961 Presence of intraocular lens: Secondary | ICD-10-CM | POA: Diagnosis not present

## 2018-03-02 DIAGNOSIS — H25041 Posterior subcapsular polar age-related cataract, right eye: Secondary | ICD-10-CM | POA: Diagnosis not present

## 2018-03-02 DIAGNOSIS — H25011 Cortical age-related cataract, right eye: Secondary | ICD-10-CM | POA: Diagnosis not present

## 2018-03-02 DIAGNOSIS — H2511 Age-related nuclear cataract, right eye: Secondary | ICD-10-CM | POA: Diagnosis not present

## 2018-03-02 NOTE — Telephone Encounter (Signed)
Patient has dropped off a parking placard to be completed for renewal. Form has been completed & placed in providers box to sign.

## 2018-03-06 DIAGNOSIS — M79644 Pain in right finger(s): Secondary | ICD-10-CM | POA: Diagnosis not present

## 2018-03-06 DIAGNOSIS — M79645 Pain in left finger(s): Secondary | ICD-10-CM | POA: Diagnosis not present

## 2018-03-06 NOTE — Telephone Encounter (Signed)
Relation to pt: self  Call back number: [phone]   Reason for call:  Patient would like to pick up handicap placard information today, please advise

## 2018-03-06 NOTE — Telephone Encounter (Signed)
LVM to inform patient his form is ready to be picked up.   Copy sent to scan.

## 2018-03-24 DIAGNOSIS — M25572 Pain in left ankle and joints of left foot: Secondary | ICD-10-CM | POA: Diagnosis not present

## 2018-03-27 DIAGNOSIS — Z0120 Encounter for dental examination and cleaning without abnormal findings: Secondary | ICD-10-CM | POA: Diagnosis not present

## 2018-03-29 ENCOUNTER — Telehealth: Payer: Self-pay

## 2018-03-29 NOTE — Telephone Encounter (Signed)
   [name] Pre-operative Risk Assessment    Request for surgical clearance:  1. What type of surgery is being performed? Cataract extraction with intraocular lens implantation of the right eye.   2. When is this surgery scheduled? 04/05/2018   3. What type of clearance is required (medical clearance vs. Pharmacy clearance to hold med vs. Both)? both  4. Are there any medications that need to be held prior to surgery and how long? Aspirin, and Plavix    5. Practice name and name of physician performing surgery? [facility]. What is your office phone number [phone]    7.   What is your office fax number [phone]  8.   Anesthesia type (None, local, MAC, general) ? unknown   Phillip [name] 03/29/2018, 10:44 AM  _________________________________________________________________   (provider comments below)

## 2018-03-31 NOTE — Telephone Encounter (Signed)
   Primary Cardiologist: Sanda Klein, MD  Chart reviewed as part of pre-operative protocol coverage. Cataract extractions are recognized in guidelines as low risk surgeries that do not typically require specific preoperative testing or holding of blood thinner therapy. Therefore, given past medical history and time since last visit, based on ACC/AHA guidelines, Phillip [name] would be at acceptable risk for the planned procedure without further cardiovascular testing.  As per Dr. Sallyanne Kuster, OK to hold ASA + plavix for 5-7 days before the surgery  I will route this recommendation to the requesting party via [place] fax function and remove from pre-op pool.  Please call with questions.  [place], PA 03/31/2018, 2:09 PM

## 2018-03-31 NOTE — Telephone Encounter (Signed)
OK to hold ASA + plavix for 5-7 days before the surgery. MCr

## 2018-04-03 DIAGNOSIS — M25572 Pain in left ankle and joints of left foot: Secondary | ICD-10-CM | POA: Diagnosis not present

## 2018-04-03 NOTE — Telephone Encounter (Signed)
Phillip [name] is calling to request a copy of surgical clearance to be faxed to her. She states they have not received it yet.

## 2018-04-03 NOTE — Telephone Encounter (Signed)
MANUAL RE FAXED INFORMATION TO SURGICAL OFFICE

## 2018-04-04 ENCOUNTER — Telehealth: Payer: Self-pay

## 2018-04-04 NOTE — Telephone Encounter (Signed)
   [facility] Pre-operative Risk Assessment    Request for surgical clearance:  1. What type of surgery is being performed? Right cataract extraction  2. When is this surgery scheduled? 04/05/18  3. What type of clearance is required (medical clearance vs. Pharmacy clearance to hold med vs. Both)? Both  4. Are there any medications that need to be held prior to surgery and how long? Plavix and Aspirin  5. Practice name and name of physician performing surgery? [facility]  6. What is your office phone number [phone]   7.   What is your office fax number [phone]  8.   Anesthesia type   Not listed  Kathyrn Lass 04/04/2018, 9:55 AM  _________________________________________________________________   (provider comments below)

## 2018-04-04 NOTE — Telephone Encounter (Signed)
preop clearance done last week.

## 2018-04-05 DIAGNOSIS — H2511 Age-related nuclear cataract, right eye: Secondary | ICD-10-CM | POA: Diagnosis not present

## 2018-04-17 ENCOUNTER — Other Ambulatory Visit: Payer: Self-pay | Admitting: Internal Medicine

## 2018-05-09 ENCOUNTER — Other Ambulatory Visit: Payer: Self-pay | Admitting: Internal Medicine

## 2018-05-09 NOTE — Telephone Encounter (Signed)
Done erx 

## 2018-05-19 ENCOUNTER — Ambulatory Visit: Payer: Medicare Other | Admitting: Internal Medicine

## 2018-06-03 IMAGING — NM NM MISC PROCEDURE
9 series · 54 of 54 positions shown · non-contrast
Comparison: none

[Series 1: wbr rest · 6.40mm/px · 6 of 64 frames shown]
[frame 6/64]
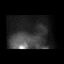
[frame 16/64]
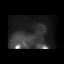
[frame 27/64]
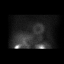
[frame 38/64]
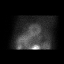
[frame 48/64]
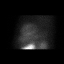
[frame 59/64]
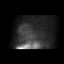

[Series 1: wbr_r-proj_st wbr rest · 6.40mm/px · 6 of 64 frames shown]
[frame 6/64]
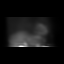
[frame 16/64]
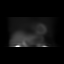
[frame 27/64]
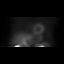
[frame 38/64]
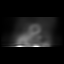
[frame 48/64]
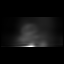
[frame 59/64]
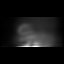

[Series 1: rest sax · 6.4mm · 6.40mm/px · 6 of 21 frames shown]
[frame 2/21]
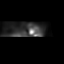
[frame 6/21]
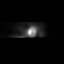
[frame 9/21]
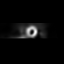
[frame 13/21]
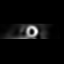
[frame 16/21]
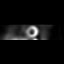
[frame 20/21]
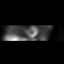

[Series 2: wbr stress-gsp · 6.40mm/px · 6 of 512 frames shown]
[frame 43/512]
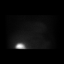
[frame 128/512]
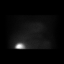
[frame 214/512]
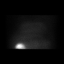
[frame 299/512]
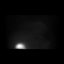
[frame 384/512]
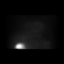
[frame 470/512]
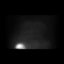

[Series 2: stress sax gs · 6.4mm · 6.40mm/px · 6 of 168 frames shown]
[frame 15/168]
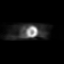
[frame 43/168]
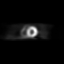
[frame 71/168]
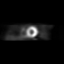
[frame 99/168]
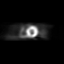
[frame 127/168]
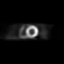
[frame 155/168]
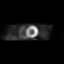

[Series 2: wbr_s-proj_st wbr stress-gsp · 6.40mm/px · 6 of 512 frames shown]
[frame 43/512]
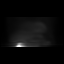
[frame 128/512]
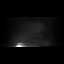
[frame 214/512]
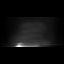
[frame 299/512]
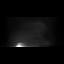
[frame 384/512]
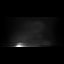
[frame 470/512]
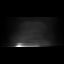

[Series 3: wbr_s-proj_st wbr stress-sum-em · 6.40mm/px · 6 of 64 frames shown]
[frame 6/64]
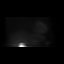
[frame 16/64]
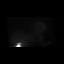
[frame 27/64]
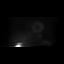
[frame 38/64]
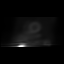
[frame 48/64]
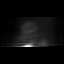
[frame 59/64]
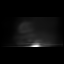

[Series 3: stress sax · 6.4mm · 6.40mm/px · 6 of 21 frames shown]
[frame 2/21]
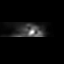
[frame 6/21]
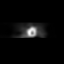
[frame 9/21]
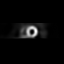
[frame 13/21]
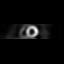
[frame 16/21]
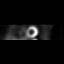
[frame 20/21]
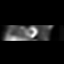

[Series 3: wbr stress-sum-em · 6.40mm/px · 6 of 64 frames shown]
[frame 6/64]
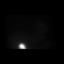
[frame 16/64]
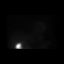
[frame 27/64]
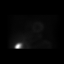
[frame 38/64]
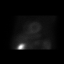
[frame 48/64]
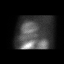
[frame 59/64]
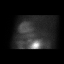

[54 of 54 positions shown; findings below may reference images not displayed]

Canned report from images found in remote index.

Refer to host system for actual result text.

## 2018-06-13 DIAGNOSIS — M2550 Pain in unspecified joint: Secondary | ICD-10-CM | POA: Diagnosis not present

## 2018-06-13 DIAGNOSIS — R5382 Chronic fatigue, unspecified: Secondary | ICD-10-CM | POA: Diagnosis not present

## 2018-06-13 DIAGNOSIS — M150 Primary generalized (osteo)arthritis: Secondary | ICD-10-CM | POA: Diagnosis not present

## 2018-06-13 DIAGNOSIS — M0579 Rheumatoid arthritis with rheumatoid factor of multiple sites without organ or systems involvement: Secondary | ICD-10-CM | POA: Diagnosis not present

## 2018-06-15 ENCOUNTER — Other Ambulatory Visit: Payer: Self-pay | Admitting: Internal Medicine

## 2018-06-26 ENCOUNTER — Encounter: Payer: Self-pay | Admitting: Internal Medicine

## 2018-06-26 ENCOUNTER — Ambulatory Visit (INDEPENDENT_AMBULATORY_CARE_PROVIDER_SITE_OTHER): Payer: Medicare Other | Admitting: Internal Medicine

## 2018-06-26 ENCOUNTER — Ambulatory Visit: Payer: Self-pay | Admitting: *Deleted

## 2018-06-26 DIAGNOSIS — R42 Dizziness and giddiness: Secondary | ICD-10-CM | POA: Insufficient documentation

## 2018-06-26 DIAGNOSIS — I10 Essential (primary) hypertension: Secondary | ICD-10-CM

## 2018-06-26 DIAGNOSIS — E119 Type 2 diabetes mellitus without complications: Secondary | ICD-10-CM | POA: Diagnosis not present

## 2018-06-26 NOTE — Assessment & Plan Note (Signed)
?   Control, cont same tx for now,  to f/u any worsening symptoms or concerns

## 2018-06-26 NOTE — Patient Instructions (Signed)

## 2018-06-26 NOTE — Assessment & Plan Note (Signed)
stable overall by history and exam, recent data reviewed with pt, and pt to continue medical treatment as before,  to f/u any worsening symptoms or concerns, for a1c with labs 

## 2018-06-26 NOTE — Progress Notes (Addendum)
Patient ID: Phillip [name], male   DOB: [birth year], 73 y.o.   MRN: [id number]  Virtual Visit via Video Note but failed, so phone only  I connected with Phillip [name] on 06/26/18 at  7:00 PM EDT by a video enabled telemedicine application and verified that I am speaking with the correct person using two identifiers.  Location: Patient: at home Provider: at home after hours  Total time:   18 minutes   I discussed the limitations of evaluation and management by telemedicine and the availability of in person appointments. The patient expressed understanding and agreed to proceed.  History of Present Illness: Here with c/o dizziness with standing quickly, unusual for him for last 4 days. Pt denies chest pain, increased sob or doe, wheezing, orthopnea, PND, increased LE swelling, palpitations, or syncope.  Pt denies new neurological symptoms such as new headache, or facial or extremity weakness or numbness   Pt denies polydipsia, polyuria. No overt bleeding on plavix.  Went by Psychologist, occupational and BP 178/82, 98.8 temp, not sure of HR on BB.  Pt denies fever, wt loss, night sweats, loss of appetite, or other constitutional symptoms Denies worsening reflux, abd pain, dysphagia, n/v, bowel change or blood. Past Medical History:  Diagnosis Date  . Anemia   . Anxiety   . Arthritis   . Bipolar depression ([place])   . CAD, multiple vessel   . Chronic pain disorder    chronic bilat shoulder pain and feet pain  . Depression   . Erectile dysfunction 01/17/2013  . GERD (gastroesophageal reflux disease)   . History of colon polyps   . Hyperlipidemia   . Hypertension   . Insomnia   . Iron deficiency anemia 01/16/2013  . Mixed hyperlipidemia 03/08/2013  . Status post insertion of drug-eluting stent into left anterior descending (LAD) artery for coronary artery disease   . Stented coronary artery 01/16/2013   X 2  . Systolic murmur   . Unspecified vitamin D deficiency 01/17/2013   Past Surgical History:   Procedure Laterality Date  . CAROTID STENT  2008  . CORONARY ANGIOPLASTY WITH STENT PLACEMENT  about 2014   current Cardiology: Dr Orene Desanctis  . EYE SURGERY      reports that he quit smoking about 16 years ago. His smoking use included cigarettes. He has a 20.00 pack-year smoking history. His smokeless tobacco use includes chew. He reports current alcohol use. He reports that he does not use drugs. family history includes Alzheimer's disease in his brother; Cancer in his maternal grandfather, maternal grandmother, and mother; Heart disease in his maternal grandmother, mother, and paternal grandfather; Hyperlipidemia in his maternal grandmother; Lung cancer in his father; Stroke in his maternal grandmother and paternal grandfather. Allergies  Allergen Reactions  . Lipitor [Atorvastatin] Other (See Comments)    myalgia   Current Outpatient Medications on File Prior to Visit  Medication Sig Dispense Refill  . ALPRAZolam (XANAX) 1 MG tablet TAKE 1/2 TO 1 TABLET BY MOUTH TWICE DAILY AS NEEDED 60 tablet 2  . amLODipine (NORVASC) 2.5 MG tablet TAKE 1 TABLET BY MOUTH ONCE DAILY 90 tablet 2  . aspirin 81 MG tablet Take 81 mg by mouth daily.    . carbamazepine (TEGRETOL) 200 MG tablet TAKE 1 TABLET BY MOUTH TWICE DAILY (Patient taking differently: TAKE 100MG  BY MOUTH TWICE DAILY) 180 tablet 2  . carvedilol (COREG) 25 MG tablet TAKE 1 TABLET BY MOUTH TWICE DAILY WITH MEALS 180 tablet 1  . clobetasol cream (TEMOVATE)  0.05 % APPLY TOPICALLY TWICE DAILY AS NEEDED 60 g 1  . clopidogrel (PLAVIX) 75 MG tablet TAKE 1 TABLET BY MOUTH DAILY 90 tablet 2  . Cyanocobalamin (VITAMIN B 12 PO) Take by mouth.    . folic acid (FOLVITE) 1 MG tablet TAKE 1 TABLET BY MOUTH DAILY 90 tablet 0  . HYDROcodone-acetaminophen (NORCO/VICODIN) 5-325 MG tablet Take 1 tablet by mouth every 6 (six) hours as needed for moderate pain. 60 tablet 0  . hydroxychloroquine (PLAQUENIL) 200 MG tablet TAKE 1/2 TABLET BY MOUTH DAILY 60 tablet 1   . hydrOXYzine (ATARAX/VISTARIL) 25 MG tablet TAKE 1 TABLET BY MOUTH 3 TIMES DAILY AS NEEDED 270 tablet 1  . leflunomide (ARAVA) 20 MG tablet Take 10 mg by mouth 2 (two) times daily.     . nitroGLYCERIN (NITROSTAT) 0.4 MG SL tablet Place 1 tablet (0.4 mg total) under the tongue every 5 (five) minutes as needed for chest pain. 25 tablet 3  . Omega-3 Fatty Acids (FISH OIL PO) Take by mouth daily.    . pantoprazole (PROTONIX) 40 MG tablet TAKE 1 TABLET BY MOUTH TWICE DAILY 180 tablet 1  . predniSONE (DELTASONE) 10 MG tablet TAKE 1 TABLET BY MOUTH DAILY 90 tablet 1  . ramipril (ALTACE) 2.5 MG capsule TAKE 1 CAPSULE BY MOUTH DAILY 90 capsule 3  . rosuvastatin (CRESTOR) 20 MG tablet TAKE 1 TABLET BY MOUTH DAILY 90 tablet 3  . sildenafil (REVATIO) 20 MG tablet Take 3 pills daily as needed 60 tablet 2  . traZODone (DESYREL) 50 MG tablet TAKE 1/2 TO 1 TABLET BY MOUTH AT BEDTIMEFOR SLEEP 90 tablet 1  . triamcinolone (NASACORT AQ) 55 MCG/ACT AERO nasal inhaler Place 2 sprays into the nose daily. 1 Inhaler 12   Current Facility-Administered Medications on File Prior to Visit  Medication Dose Route Frequency Provider Last Rate Last Dose  . triamcinolone acetonide (KENALOG) 10 MG/ML injection 10 mg  10 mg Other Once Landis Martins, DPM        Observations/Objective: Unable by phone Lab Results  Component Value Date   WBC 5.2 11/17/2017   HGB 12.3 (L) 11/17/2017   HCT 36.6 (L) 11/17/2017   PLT 296.0 11/17/2017   GLUCOSE 129 (H) 11/17/2017   CHOL 148 11/17/2017   TRIG 76.0 11/17/2017   HDL 64.80 11/17/2017   LDLDIRECT 142.5 02/27/2014   LDLCALC 68 11/17/2017   ALT 15 11/17/2017   AST 13 11/17/2017   NA 136 11/17/2017   K 4.1 11/17/2017   CL 98 11/17/2017   CREATININE 0.75 11/17/2017   BUN 10 11/17/2017   CO2 30 11/17/2017   TSH 1.44 11/17/2017   PSA 0.60 11/17/2017   INR 1.0 09/15/2008   HGBA1C 6.8 (H) 11/17/2017   Assessment and Plan: See notes  Follow Up Instructions: See notes    I discussed the assessment and treatment plan with the patient. The patient was provided an opportunity to ask questions and all were answered. The patient agreed with the plan and demonstrated an understanding of the instructions.   The patient was advised to call back or seek an in-person evaluation if the symptoms worsen or if the condition fails to improve as anticipated.  Cathlean Cower, MD

## 2018-06-26 NOTE — Telephone Encounter (Signed)
Pt called stating that he feels intermittently  dizzy upon standing, and getting fatigued easily; these symptoms have been occurring since 4/32020; recommendations made per nurse triage protocol; conference call initiated with Tammy; per Tammy, pt offered and accepted virtual visit with Dr Cathlean Cower, [facility], 06/26/2018 at 1900; the pt verbalized understanding; will route to office for notification of this encounter.   Reason for Disposition . [1] MODERATE dizziness (e.g., interferes with normal activities) AND [2] has NOT been evaluated by physician for this  (Exception: dizziness caused by heat exposure, sudden standing, or poor fluid intake)  Answer Assessment - Initial Assessment Questions 1. DESCRIPTION: "Describe your dizziness."    "Feels like a crab walking sideways" 2. LIGHTHEADED: "Do you feel lightheaded?" (e.g., somewhat faint, woozy, weak upon standing)     Easily fatigued, Weak  3. VERTIGO: "Do you feel like either you or the room is spinning or tilting?"       No 4. SEVERITY: "How bad is it?"  "Do you feel like you are going to faint?" "Can you stand and walk?"   - MILD - walking normally   - MODERATE - interferes with normal activities (e.g., work, school)    - SEVERE - unable to stand, requires support to walk, feels like passing out now.      moderate 5. ONSET:  "When did the dizziness begin?"     06/22/2018 6. AGGRAVATING FACTORS: "Does anything make it worse?" (e.g., standing, change in head position)     standing 7. HEART RATE: "Can you tell me your heart rate?" "How many beats in 15 seconds?"  (Note: not all patients can do this)       Pt not able to complete this task 8. CAUSE: "What do you think is causing the dizziness?"     ? Inner ear problems 9. RECURRENT SYMPTOM: "Have you had dizziness before?" If so, ask: "When was the last time?" "What happened that time?"     no 10. OTHER SYMPTOMS: "Do you have any other symptoms?" (e.g., fever, chest pain, vomiting,  diarrhea, bleeding)      no 11. PREGNANCY: "Is there any chance you are pregnant?" "When was your last menstrual period?"       n/a  Protocols used: DIZZINESS [facility]

## 2018-06-26 NOTE — Assessment & Plan Note (Signed)
Etiology unclear, for labs as ordered,  to f/u any worsening symptoms or concerns 

## 2018-06-27 ENCOUNTER — Other Ambulatory Visit (INDEPENDENT_AMBULATORY_CARE_PROVIDER_SITE_OTHER): Payer: Medicare Other

## 2018-06-27 ENCOUNTER — Other Ambulatory Visit: Payer: Medicare Other

## 2018-06-27 DIAGNOSIS — E119 Type 2 diabetes mellitus without complications: Secondary | ICD-10-CM

## 2018-06-27 DIAGNOSIS — D649 Anemia, unspecified: Secondary | ICD-10-CM | POA: Diagnosis not present

## 2018-06-27 LAB — HEPATIC FUNCTION PANEL
ALT: 15 U/L (ref 0–53)
AST: 14 U/L (ref 0–37)
Albumin: 4 g/dL (ref 3.5–5.2)
Alkaline Phosphatase: 44 U/L (ref 39–117)
Bilirubin, Direct: 0.1 mg/dL (ref 0.0–0.3)
Total Bilirubin: 0.2 mg/dL (ref 0.2–1.2)
Total Protein: 6.6 g/dL (ref 6.0–8.3)

## 2018-06-27 LAB — URINALYSIS, ROUTINE W REFLEX MICROSCOPIC
Bilirubin Urine: NEGATIVE
Hgb urine dipstick: NEGATIVE
Ketones, ur: NEGATIVE
Leukocytes,Ua: NEGATIVE
Nitrite: NEGATIVE
RBC / HPF: NONE SEEN (ref 0–?)
Specific Gravity, Urine: 1.015 (ref 1.000–1.030)
Total Protein, Urine: NEGATIVE
Urine Glucose: 250 — AB
Urobilinogen, UA: 0.2 (ref 0.0–1.0)
WBC, UA: NONE SEEN (ref 0–?)
pH: 6.5 (ref 5.0–8.0)

## 2018-06-27 LAB — BASIC METABOLIC PANEL
BUN: 9 mg/dL (ref 6–23)
CO2: 28 mEq/L (ref 19–32)
Calcium: 8.6 mg/dL (ref 8.4–10.5)
Chloride: 100 mEq/L (ref 96–112)
Creatinine, Ser: 0.72 mg/dL (ref 0.40–1.50)
GFR: 106.95 mL/min (ref >60.00)
Glucose, Bld: 178 mg/dL — ABNORMAL HIGH (ref 70–99)
Potassium: 3.7 mEq/L (ref 3.5–5.1)
Sodium: 136 mEq/L (ref 135–145)

## 2018-06-27 LAB — CBC WITH DIFFERENTIAL/PLATELET
Basophils Absolute: 0.1 10*3/uL (ref 0.0–0.1)
Basophils Relative: 1.1 % (ref 0.0–3.0)
Eosinophils Absolute: 0.1 10*3/uL (ref 0.0–0.7)
Eosinophils Relative: 2.1 % (ref 0.0–5.0)
HCT: 27.4 % — ABNORMAL LOW (ref 39.0–52.0)
Hemoglobin: 9.3 g/dL — ABNORMAL LOW (ref 13.0–17.0)
Lymphocytes Relative: 22.5 % (ref 12.0–46.0)
Lymphs Abs: 1.3 10*3/uL (ref 0.7–4.0)
MCHC: 33.9 g/dL (ref 30.0–36.0)
MCV: 91.3 fl (ref 78.0–100.0)
Monocytes Absolute: 0.6 10*3/uL (ref 0.1–1.0)
Monocytes Relative: 10.6 % (ref 3.0–12.0)
Neutro Abs: 3.7 10*3/uL (ref 1.4–7.7)
Neutrophils Relative %: 63.7 % (ref 43.0–77.0)
Platelets: 376 10*3/uL (ref 150.0–400.0)
RBC: 3 Mil/uL — ABNORMAL LOW (ref 4.22–5.81)
RDW: 13.5 % (ref 11.5–15.5)
WBC: 5.8 10*3/uL (ref 4.0–10.5)

## 2018-06-27 LAB — TSH: TSH: 2.09 u[IU]/mL (ref 0.35–4.50)

## 2018-06-27 LAB — LIPID PANEL
Cholesterol: 135 mg/dL (ref 0–200)
HDL: 59.9 mg/dL (ref >39.00)
LDL Cholesterol: 55 mg/dL (ref 0–99)
NonHDL: 75.55
Total CHOL/HDL Ratio: 2
Triglycerides: 104 mg/dL (ref 0.0–149.0)
VLDL: 20.8 mg/dL (ref 0.0–40.0)

## 2018-06-27 LAB — IBC PANEL
Iron: 27 ug/dL — ABNORMAL LOW (ref 42–165)
Saturation Ratios: 6.5 % — ABNORMAL LOW (ref 20.0–50.0)
Transferrin: 295 mg/dL (ref 212.0–360.0)

## 2018-06-27 LAB — HEMOGLOBIN A1C: Hgb A1c MFr Bld: 6.3 % (ref 4.6–6.5)

## 2018-06-28 ENCOUNTER — Encounter: Payer: Self-pay | Admitting: Internal Medicine

## 2018-06-28 ENCOUNTER — Other Ambulatory Visit: Payer: Self-pay | Admitting: Internal Medicine

## 2018-06-28 DIAGNOSIS — D509 Iron deficiency anemia, unspecified: Secondary | ICD-10-CM

## 2018-06-28 MED ORDER — FERROUS SULFATE 325 (65 FE) MG PO TBEC: 325.0000 mg | DELAYED_RELEASE_TABLET | Freq: Two times a day (BID) | ORAL | 5 refills | Status: AC

## 2018-06-29 ENCOUNTER — Telehealth: Payer: Self-pay

## 2018-06-29 NOTE — Telephone Encounter (Signed)
-----   Message from Biagio Borg, MD sent at 06/28/2018  8:59 PM EDT ----- Letter sent, cont same tx except  The test results show that your current treatment is OK, except we have likely found the reason for the shortness of breath with exercise, which is new iron deficiency anemia.  The reason for this is not clear.  I recall asking if you have noticed any obvious bleeding, but you had said no.  Still, you may have a "slow leak" of blood internally over a period of time that is now causing more symptoms.  To look into this, we need to have you go back to the lab for a FOBT (fecal occult blood test), and repeat CBC (blood count).  We also need to start Iron Sulfate at 325 mg twice per day, and refer to Gastroenterology.  I will send the prescription and place the orders for you to do soon, and you should also hear from the office about all of this.  [name] to please inform pt, I will do orders, rx and referral

## 2018-06-29 NOTE — Telephone Encounter (Signed)
Called pt, LVM.   CRM created.  

## 2018-07-19 ENCOUNTER — Other Ambulatory Visit: Payer: Self-pay | Admitting: Internal Medicine

## 2018-07-31 ENCOUNTER — Telehealth: Payer: Self-pay | Admitting: Cardiovascular Disease

## 2018-07-31 NOTE — Telephone Encounter (Signed)
Patient is returning your call.  

## 2018-07-31 NOTE — Telephone Encounter (Signed)
Left message to call back  

## 2018-07-31 NOTE — Telephone Encounter (Signed)
Please increase the amlodipine to 10 mg daily and have him seen in the office for follow up sometime this week for check on his response to medication changes. Need to continue to take BP's at home and keep a record. .Usually takes a couple of weeks to really see how the BP is doing, but due to elevation, he should be seen this week sometime for evaluation.

## 2018-07-31 NOTE — Telephone Encounter (Signed)
Advised patient, verbalized understanding. Patient has visit with [facility] 08/02/18

## 2018-07-31 NOTE — Telephone Encounter (Signed)
New message   Pt c/o BP issue: STAT if pt c/o blurred vision, one-sided weakness or slurred speech  1. What are your last 5 BP readings? 200/104 b/p   2. Are you having any other symptoms (ex. Dizziness, headache, blurred vision, passed out)? Dizziness on yesterday   3. What is your BP issue?patient's b/p is elevated

## 2018-07-31 NOTE — Telephone Encounter (Signed)
Spoke with patient and his blood pressure was elevated. He had not taken his morning medications. Yesterday he did play golf and knows he got overheated. He had plenty of fluids prior to golfing but did drink beer during that time. Today his blood pressure was 197/104 without medications and had only had coffee to drink. Advised to drink plenty of water to rehydrate himself and take medications. He does not have a blood pressure machine at home. He is not taking Ramipril, stated it had been discontinued some time ago. He has been having a "fluttering in his chest" for the last couple of weeks. Has been having increased shortness of breath with exertion at times for 1-2 months. Patient would like an in office visit. Will forward to [place] to see if she can see this week

## 2018-08-01 ENCOUNTER — Other Ambulatory Visit: Payer: Self-pay | Admitting: Internal Medicine

## 2018-08-02 ENCOUNTER — Ambulatory Visit (INDEPENDENT_AMBULATORY_CARE_PROVIDER_SITE_OTHER): Payer: Medicare Other | Admitting: Adult Health

## 2018-08-02 ENCOUNTER — Other Ambulatory Visit: Payer: Self-pay

## 2018-08-02 ENCOUNTER — Encounter: Payer: Self-pay | Admitting: Adult Health

## 2018-08-02 ENCOUNTER — Other Ambulatory Visit: Payer: Medicare Other

## 2018-08-02 VITALS — BP 138/68 | HR 72 | Temp 97.4°F | Ht 70.0 in | Wt 164.0 lb

## 2018-08-02 DIAGNOSIS — Z9861 Coronary angioplasty status: Secondary | ICD-10-CM

## 2018-08-02 DIAGNOSIS — I10 Essential (primary) hypertension: Secondary | ICD-10-CM

## 2018-08-02 DIAGNOSIS — E7800 Pure hypercholesterolemia, unspecified: Secondary | ICD-10-CM

## 2018-08-02 DIAGNOSIS — R002 Palpitations: Secondary | ICD-10-CM

## 2018-08-02 DIAGNOSIS — I2510 Atherosclerotic heart disease of native coronary artery without angina pectoris: Secondary | ICD-10-CM

## 2018-08-02 DIAGNOSIS — F1010 Alcohol abuse, uncomplicated: Secondary | ICD-10-CM

## 2018-08-02 DIAGNOSIS — D649 Anemia, unspecified: Secondary | ICD-10-CM

## 2018-08-02 MED ORDER — AMLODIPINE BESYLATE 2.5 MG PO TABS: 5.0000 mg | ORAL_TABLET | Freq: Every day | ORAL | Status: DC

## 2018-08-02 NOTE — Patient Instructions (Addendum)
Medication Instructions:  DECREASE AMLODIPINE 5MG  DAILY If you need a refill on your cardiac medications before your next appointment, please call your pharmacy.  Testing/Procedures: Echocardiogram - Your physician has requested that you have an echocardiogram. Echocardiography is a painless test that uses sound waves to create images of your heart. It provides your doctor with information about the size and shape of your heart and how well your hearts chambers and valves are working. This procedure takes approximately one hour. There are no restrictions for this procedure. This will be performed at our [facility] location - [address].  Special Instructions: PLEASE FOLLOW LOW SODIUM DIET  Follow-Up: You will need a follow up appointment in 1 months 09-12-2018 @940AM  VIRTUAL VISIT WITH Sanda Klein, MD, Jory Sims, DNP, AACC  or one of the following Advanced Practice Providers on your designated Care Team: Almyra Deforest, PA-C  Fabian Sharp, PA-C     At [facility], you and your health needs are our priority.  As part of our continuing mission to provide you with exceptional heart care, we have created designated Provider Care Teams.  These Care Teams include your primary Cardiologist (physician) and Advanced Practice Providers (APPs -  Physician Assistants and Nurse Practitioners) who all work together to provide you with the care you need, when you need it.  Thank you for choosing [facility] at [facility]!!      Low-Sodium Eating Plan Sodium, which is an element that makes up salt, helps you maintain a healthy balance of fluids in your body. Too much sodium can increase your blood pressure and cause fluid and waste to be held in your body. Your health care provider or dietitian may recommend following this plan if you have high blood pressure (hypertension), kidney disease, liver disease, or heart failure. Eating less sodium can help lower your blood pressure, reduce  swelling, and protect your heart, liver, and kidneys. What are tips for following this plan? General guidelines  Most people on this plan should limit their sodium intake to 1,500-2,000 mg (milligrams) of sodium each day. Reading food labels   The Nutrition Facts label lists the amount of sodium in one serving of the food. If you eat more than one serving, you must multiply the listed amount of sodium by the number of servings.  Choose foods with less than 140 mg of sodium per serving.  Avoid foods with 300 mg of sodium or more per serving. Shopping  Look for lower-sodium products, often labeled as "low-sodium" or "no salt added."  Always check the sodium content even if foods are labeled as "unsalted" or "no salt added".  Buy fresh foods. ? Avoid canned foods and premade or frozen meals. ? Avoid canned, cured, or processed meats  Buy breads that have less than 80 mg of sodium per slice. Cooking  Eat more home-cooked food and less restaurant, buffet, and fast food.  Avoid adding salt when cooking. Use salt-free seasonings or herbs instead of table salt or sea salt. Check with your health care provider or pharmacist before using salt substitutes.  Cook with plant-based oils, such as canola, sunflower, or olive oil. Meal planning  When eating at a restaurant, ask that your food be prepared with less salt or no salt, if possible.  Avoid foods that contain MSG (monosodium glutamate). MSG is sometimes added to [place] food, bouillon, and some canned foods. What foods are recommended? The items listed may not be a complete list. Talk with your  dietitian about what dietary choices are best for you. Grains Low-sodium cereals, including oats, puffed wheat and rice, and shredded wheat. Low-sodium crackers. Unsalted rice. Unsalted pasta. Low-sodium bread. Whole-grain breads and whole-grain pasta. Vegetables Fresh or frozen vegetables. "No salt added" canned vegetables. "No salt added"  tomato sauce and paste. Low-sodium or reduced-sodium tomato and vegetable juice. Fruits Fresh, frozen, or canned fruit. Fruit juice. Meats and other protein foods Fresh or frozen (no salt added) meat, poultry, seafood, and fish. Low-sodium canned tuna and salmon. Unsalted nuts. Dried peas, beans, and lentils without added salt. Unsalted canned beans. Eggs. Unsalted nut butters. Dairy Milk. Soy milk. Cheese that is naturally low in sodium, such as ricotta cheese, fresh mozzarella, or Swiss cheese Low-sodium or reduced-sodium cheese. Cream cheese. Yogurt. Fats and oils Unsalted butter. Unsalted margarine with no trans fat. Vegetable oils such as canola or olive oils. Seasonings and other foods Fresh and dried herbs and spices. Salt-free seasonings. Low-sodium mustard and ketchup. Sodium-free salad dressing. Sodium-free light mayonnaise. Fresh or refrigerated horseradish. Lemon juice. Vinegar. Homemade, reduced-sodium, or low-sodium soups. Unsalted popcorn and pretzels. Low-salt or salt-free chips. What foods are not recommended? The items listed may not be a complete list. Talk with your dietitian about what dietary choices are best for you. Grains Instant hot cereals. Bread stuffing, pancake, and biscuit mixes. Croutons. Seasoned rice or pasta mixes. Noodle soup cups. Boxed or frozen macaroni and cheese. Regular salted crackers. Self-rising flour. Vegetables Sauerkraut, pickled vegetables, and relishes. Olives. [place] fries. Onion rings. Regular canned vegetables (not low-sodium or reduced-sodium). Regular canned tomato sauce and paste (not low-sodium or reduced-sodium). Regular tomato and vegetable juice (not low-sodium or reduced-sodium). Frozen vegetables in sauces. Meats and other protein foods Meat or fish that is salted, canned, smoked, spiced, or pickled. Bacon, ham, sausage, hotdogs, corned beef, chipped beef, packaged lunch meats, salt pork, jerky, pickled herring, anchovies, regular canned  tuna, sardines, salted nuts. Dairy Processed cheese and cheese spreads. Cheese curds. Blue cheese. Feta cheese. String cheese. Regular cottage cheese. Buttermilk. Canned milk. Fats and oils Salted butter. Regular margarine. Ghee. Bacon fat. Seasonings and other foods Onion salt, garlic salt, seasoned salt, table salt, and sea salt. Canned and packaged gravies. Worcestershire sauce. Tartar sauce. Barbecue sauce. Teriyaki sauce. Soy sauce, including reduced-sodium. Steak sauce. Fish sauce. Oyster sauce. Cocktail sauce. Horseradish that you find on the shelf. Regular ketchup and mustard. Meat flavorings and tenderizers. Bouillon cubes. Hot sauce and Tabasco sauce. Premade or packaged marinades. Premade or packaged taco seasonings. Relishes. Regular salad dressings. Salsa. Potato and tortilla chips. Corn chips and puffs. Salted popcorn and pretzels. Canned or dried soups. Pizza. Frozen entrees and pot pies. Summary  Eating less sodium can help lower your blood pressure, reduce swelling, and protect your heart, liver, and kidneys.  Most people on this plan should limit their sodium intake to 1,500-2,000 mg (milligrams) of sodium each day.  Canned, boxed, and frozen foods are high in sodium. Restaurant foods, fast foods, and pizza are also very high in sodium. You also get sodium by adding salt to food.  Try to cook at home, eat more fresh fruits and vegetables, and eat less fast food, canned, processed, or prepared foods. This information is not intended to replace advice given to you by your health care provider. Make sure you discuss any questions you have with your health care provider. Document Released: 07/31/2001 Document Revised: 02/02/2016 Document Reviewed: 02/02/2016 Elsevier Interactive Patient Education  2019 [employer].

## 2018-08-02 NOTE — Progress Notes (Signed)
Cardiology Office Note     Date:  08/02/2018   ID:  Phillip [name], DOB 1946-02-05, MRN [id number]  PCP:  Phillip Borg, MD  Cardiologist:  Phillip [name]     History of Present Illness: Phillip [name] is a 73 y.o. male who presents for ongoing assessment and management of CAD with DES to ramus intermedius in 2008, and DES to OM1 branch of the left circumflex in 2010, HTN, and HL (intoerant to multiple statins but now on Crestor). He was last seen  In the office by Dr. Sallyanne [name] on 9/230/2019 and was stable from cardiac standpoint. He was continued on his medical regimen.   He called our office on 07/31/2018 with elevated BP, 200/104. We have asked him to be seen today for evaluation.   He states that he has been having some palpitations and noticing higher BP readings over the last month. He admits to heavy drinking throughout the day, at least a 6 pack of beer or more along with 2-3 "dirty martini's"  He has not restricted his salt.  He has been taking extra doses of amlodipine for BP elevations.   He has seen his PCP, Dr. Jenny [name] who did labs which revealed that he was anemic. Hgb of 9.3. He was advised to have FOBT and follow up labs, but he does not remember being told to do so.  I have given him a copy of the letter that was sent to him from Dr. Jenny [name].   He denies  Chest pain dizziness, DOE. He takes his BP using a wrist machine.   Past Medical History:  Diagnosis Date  . Anemia   . Anxiety   . Arthritis   . Bipolar depression ([place])   . CAD, multiple vessel   . Chronic pain disorder    chronic bilat shoulder pain and feet pain  . Depression   . Erectile dysfunction 01/17/2013  . GERD (gastroesophageal reflux disease)   . History of colon polyps   . Hyperlipidemia   . Hypertension   . Insomnia   . Iron deficiency anemia 01/16/2013  . Mixed hyperlipidemia 03/08/2013  . Status post insertion of drug-eluting stent into left anterior descending (LAD) artery for coronary artery disease   .  Stented coronary artery 01/16/2013   X 2  . Systolic murmur   . Unspecified vitamin D deficiency 01/17/2013    Past Surgical History:  Procedure Laterality Date  . CAROTID STENT  2008  . CORONARY ANGIOPLASTY WITH STENT PLACEMENT  about 2014   current Cardiology: Dr Phillip [name]  . EYE SURGERY       Current Outpatient Medications  Medication Sig Dispense Refill  . ALPRAZolam (XANAX) 1 MG tablet TAKE 1/2 TO 1 TABLET BY MOUTH TWICE DAILY AS NEEDED 60 tablet 2  . amLODipine (NORVASC) 2.5 MG tablet Take 10 mg by mouth daily.    Marland Kitchen aspirin 81 MG tablet Take 81 mg by mouth daily.    . carbamazepine (TEGRETOL) 200 MG tablet TAKE 1 TABLET BY MOUTH TWICE DAILY (Patient taking differently: TAKE 100MG  BY MOUTH TWICE DAILY) 180 tablet 2  . carvedilol (COREG) 25 MG tablet TAKE 1 TABLET BY MOUTH TWICE DAILY WITH MEALS 180 tablet 1  . clobetasol cream (TEMOVATE) 0.05 % APPLY TOPICALLY TWICE DAILY AS NEEDED 60 g 1  . clopidogrel (PLAVIX) 75 MG tablet TAKE 1 TABLET BY MOUTH DAILY 90 tablet 0  . Cyanocobalamin (VITAMIN B 12 PO) Take by mouth.    . fenofibrate 54 MG  tablet TAKE 1 TABLET BY MOUTH DAILY 90 tablet 2  . ferrous sulfate 325 (65 FE) MG EC tablet Take 1 tablet (325 mg total) by mouth 2 (two) times a day. 60 tablet 5  . folic acid (FOLVITE) 1 MG tablet TAKE 1 TABLET BY MOUTH DAILY 90 tablet 0  . HYDROcodone-acetaminophen (NORCO/VICODIN) 5-325 MG tablet Take 1 tablet by mouth every 6 (six) hours as needed for moderate pain. 60 tablet 0  . hydroxychloroquine (PLAQUENIL) 200 MG tablet TAKE 1/2 TABLET BY MOUTH DAILY 60 tablet 1  . hydrOXYzine (ATARAX/VISTARIL) 25 MG tablet TAKE 1 TABLET BY MOUTH 3 TIMES DAILY AS NEEDED 270 tablet 1  . leflunomide (ARAVA) 20 MG tablet Take 10 mg by mouth 2 (two) times daily.     . nitroGLYCERIN (NITROSTAT) 0.4 MG SL tablet Place 1 tablet (0.4 mg total) under the tongue every 5 (five) minutes as needed for chest pain. 25 tablet 3  . Omega-3 Fatty Acids (FISH OIL PO) Take  by mouth daily.    . pantoprazole (PROTONIX) 40 MG tablet TAKE 1 TABLET BY MOUTH TWICE DAILY 180 tablet 1  . predniSONE (DELTASONE) 10 MG tablet TAKE 1 TABLET BY MOUTH DAILY 90 tablet 1  . ramipril (ALTACE) 2.5 MG capsule TAKE 1 CAPSULE BY MOUTH DAILY 90 capsule 3  . rosuvastatin (CRESTOR) 20 MG tablet TAKE 1 TABLET BY MOUTH DAILY 90 tablet 3  . sildenafil (REVATIO) 20 MG tablet Take 3 pills daily as needed 60 tablet 2  . traZODone (DESYREL) 50 MG tablet TAKE 1/2 TO 1 TABLET BY MOUTH AT BEDTIMEFOR SLEEP 90 tablet 1  . triamcinolone (NASACORT AQ) 55 MCG/ACT AERO nasal inhaler Place 2 sprays into the nose daily. 1 Inhaler 12   Current Facility-Administered Medications  Medication Dose Route Frequency Provider Last Rate Last Dose  . triamcinolone acetonide (KENALOG) 10 MG/ML injection 10 mg  10 mg Other Once Phillip [name], DPM        Allergies:   Lipitor [atorvastatin]    Social History:  The patient  reports that he quit smoking about 16 years ago. His smoking use included cigarettes. He has a 20.00 pack-year smoking history. His smokeless tobacco use includes chew. He reports current alcohol use. He reports that he does not use drugs.   Family History:  The patient's family history includes Alzheimer's disease in his brother; Cancer in his maternal grandfather, maternal grandmother, and mother; Heart disease in his maternal grandmother, mother, and paternal grandfather; Hyperlipidemia in his maternal grandmother; Lung cancer in his father; Stroke in his maternal grandmother and paternal grandfather.    ROS: All other systems are reviewed and negative. Unless otherwise mentioned in H&P    PHYSICAL EXAM: VS:  There were no vitals taken for this visit. , BMI There is no height or weight on file to calculate BMI. GEN: Well nourished, well developed, in no acute distress HEENT: normal Neck: no JVD, carotid bruits, or masses Cardiac: RRR;distant heart sounds,  no murmurs, rubs, or gallops,no  edema  Respiratory:  Clear to auscultation bilaterally, normal work of breathing GI: soft, nontender, nondistended, + BS MS: no deformity or atrophy Skin: warm and dry, no rash Neuro:  Strength and sensation are intact Psych: euthymic mood, full affect   EKG:  NSR rate of 68 bpm  Recent Labs: 06/27/2018: ALT 15; BUN 9; Creatinine, Ser 0.72; Hemoglobin 9.3; Platelets 376.0; Potassium 3.7; Sodium 136; TSH 2.09    Lipid Panel    Component Value Date/Time  CHOL 135 06/27/2018 0940   TRIG 104.0 06/27/2018 0940   HDL 59.90 06/27/2018 0940   CHOLHDL 2 06/27/2018 0940   VLDL 20.8 06/27/2018 0940   LDLCALC 55 06/27/2018 0940   LDLDIRECT 142.5 02/27/2014 1013      Wt Readings from Last 3 Encounters:  11/21/17 165 lb (74.8 kg)  11/17/17 165 lb (74.8 kg)  09/08/17 163 lb 12.8 oz (74.3 kg)      Other studies Reviewed: Study Highlights 06/22/2018     The left ventricular ejection fraction is mildly decreased (45-54%).  Nuclear stress EF: 45%.  There was no ST segment deviation noted during stress.  Findings consistent with prior myocardial infarction.  This is an intermediate risk study.   Small inferior wall infarct from apex to base with no ischemia EF 45% with apical hypokinesis      ASSESSMENT AND PLAN:  1. Hypertension: He is not restricting his salt. I checked his BP in the office finding 138/68. He has taken 5 mg of amlodipine today and ramipril along with coreg.  Review of labs from 06/27/2018 creatinine 0.72, potassium 3.7. I will check echo for evaluation of LV fx and for cardiomyopathy in the setting of excessive ETOH use. He is to get a BP cuff that will take brachial BP, keep fresh batteries in his machine and avoid salt.   2. Frequent palpitations: Consider placing a cardiac monitor if this is persistent to rule out PAF in the setting of ETOH abuse.  3. CAD: DES to the ramus intermedius in 2008 and DES to the OM1 branch of the left Cx in 2010. He continues on  coreg, and Plavix. No chest pain or pressure is reported. Continue secondary prevention.   4. Hypercholesterolemia; Continue Crestor 20 mg daily.  I have reviewed is recent labs per his PCP and LFT';s are WNL.   5. ETOH abuse: Drinks at least a 6 pack a day or more, along with "dirty martini's with extra olives" 2-3 each evening. I have counseled him about the amount of ETOH consumption. He verbalizes understanding but is unlikely to reduce his drinking.   6. Anemia: Being followed by Dr. Jenny [name]. Most recent Hgb 9.3 with Hct 27.4. He has follow labs and FOBT pending.    Current medicines are reviewed at length with the patient today.    Labs/ tests ordered today include: Echocardiogram   Phill Myron. West Pugh, ANP, [facility]   08/02/2018 7:39 AM    [facility] [address] Office [phone] Fax [phone]

## 2018-08-11 ENCOUNTER — Other Ambulatory Visit (INDEPENDENT_AMBULATORY_CARE_PROVIDER_SITE_OTHER): Payer: Medicare Other

## 2018-08-11 ENCOUNTER — Telehealth (HOSPITAL_COMMUNITY): Payer: Self-pay | Admitting: Radiology

## 2018-08-11 DIAGNOSIS — D509 Iron deficiency anemia, unspecified: Secondary | ICD-10-CM | POA: Diagnosis not present

## 2018-08-11 LAB — FECAL OCCULT BLOOD, IMMUNOCHEMICAL: Fecal Occult Bld: POSITIVE — AB

## 2018-08-11 NOTE — Telephone Encounter (Signed)

## 2018-08-14 ENCOUNTER — Ambulatory Visit (HOSPITAL_COMMUNITY): Payer: Medicare Other | Attending: Cardiovascular Disease

## 2018-08-14 ENCOUNTER — Encounter (INDEPENDENT_AMBULATORY_CARE_PROVIDER_SITE_OTHER): Payer: Self-pay

## 2018-08-14 ENCOUNTER — Other Ambulatory Visit: Payer: Self-pay

## 2018-08-14 DIAGNOSIS — R002 Palpitations: Secondary | ICD-10-CM | POA: Diagnosis not present

## 2018-08-14 DIAGNOSIS — I10 Essential (primary) hypertension: Secondary | ICD-10-CM | POA: Insufficient documentation

## 2018-08-21 ENCOUNTER — Other Ambulatory Visit: Payer: Self-pay | Admitting: Cardiovascular Disease

## 2018-08-21 MED ORDER — AMLODIPINE BESYLATE 5 MG PO TABS: 5.0000 mg | ORAL_TABLET | Freq: Every day | ORAL | 3 refills | Status: DC

## 2018-08-21 NOTE — Telephone Encounter (Signed)
Rx(s) sent to pharmacy electronically.  

## 2018-08-21 NOTE — Telephone Encounter (Signed)
 *  STAT* If patient is at the pharmacy, call can be transferred to refill team.   1. Which medications need to be refilled? (please list name of each medication and dose if known) amLODipine (NORVASC) 2.5 MG tablet  2. Which pharmacy/location (including street and city if local pharmacy) is medication to be sent to? [facility]  3. Do they need a 30 day or 90 day supply? Normal told him at last visit to take 4 daily and he is running out of his prescription early. He would like to get a refill with the script written with the new dosage.

## 2018-08-22 ENCOUNTER — Other Ambulatory Visit: Payer: Self-pay | Admitting: Internal Medicine

## 2018-08-22 NOTE — Telephone Encounter (Signed)
Done erx 

## 2018-09-03 NOTE — Progress Notes (Unsigned)
{Choose 1 Note Type (Telehealth Visit or Telephone Visit):[phone]}   Date:  09/03/2018   ID:  Phillip [name], DOB 07-17-1945, MRN [id number]  {Patient Location:[phone]::"Home"} {Provider [phone]::"Home"}  PCP:  Biagio Borg, MD  Cardiologist:  Sanda Klein, MD  Electrophysiologist:  None   Evaluation Performed:  {Choose Visit Type:[phone]::"Follow-Up Visit"}  Chief Complaint:  ***  History of Present Illness:    Phillip [name] is a 73 y.o. male who presents for ongoing assessment and management of coronary artery disease, history of drug-eluting stent to the ramus intermediate in 2008, drug-eluting stent to the OM1 branch of the left circumflex in 2010, hypertension, hyperlipidemia (intolerant to multiple statins but is now on Crestor).  The patient was last seen in the office on 08/02/2018 after complaints of elevated blood pressure readings at home.  He admitted to heavy drinking throughout the day at least a sixpack of beer along with 2-3 "dirty martinis" each day.  He had not restricted his salt intake.  On that visit, his blood pressure was 136/68 by manual check while I assessed him in the office.  I repeated his echocardiogram to check for changes in LV function and for cardiomyopathy in the setting of excessive EtOH use.  He was advised to take his blood pressure with a brachial pulse blood pressure monitor.  He was advised to decrease his salt and alcohol use.  He had also complained of some palpitations and I considered adding a monitor if these became persistent.   The patient {does/does not:200015} have symptoms concerning for COVID-19 infection (fever, chills, cough, or new shortness of breath).    Past Medical History:  Diagnosis Date  . Anemia   . Anxiety   . Arthritis   . Bipolar depression ([place])   . CAD, multiple vessel   . Chronic pain disorder    chronic bilat shoulder pain and feet pain  . Depression   . Erectile dysfunction 01/17/2013  .  GERD (gastroesophageal reflux disease)   . History of colon polyps   . Hyperlipidemia   . Hypertension   . Insomnia   . Iron deficiency anemia 01/16/2013  . Mixed hyperlipidemia 03/08/2013  . Status post insertion of drug-eluting stent into left anterior descending (LAD) artery for coronary artery disease   . Stented coronary artery 01/16/2013   X 2  . Systolic murmur   . Unspecified vitamin D deficiency 01/17/2013   Past Surgical History:  Procedure Laterality Date  . CAROTID STENT  2008  . CORONARY ANGIOPLASTY WITH STENT PLACEMENT  about 2014   current Cardiology: Dr Orene Desanctis  . EYE SURGERY       No outpatient medications have been marked as taking for the 09/04/18 encounter (Appointment) with Lendon Colonel, NP.   Current Facility-Administered Medications for the 09/04/18 encounter (Appointment) with Lendon Colonel, NP  Medication  . triamcinolone acetonide (KENALOG) 10 MG/ML injection 10 mg     Allergies:   Lipitor [atorvastatin]   Social History   Tobacco Use  . Smoking status: Former Smoker    Packs/day: 1.00    Years: 20.00    Pack years: 20.00    Types: Cigarettes    Quit date: 02/22/2002    Years since quitting: 16.5  . Smokeless tobacco: Current User    Types: Chew  Substance Use Topics  . Alcohol use: Yes  . Drug use: No     Family Hx: The patient's family history includes Alzheimer's disease in his brother; Cancer in his  maternal grandfather, maternal grandmother, and mother; Heart disease in his maternal grandmother, mother, and paternal grandfather; Hyperlipidemia in his maternal grandmother; Lung cancer in his father; Stroke in his maternal grandmother and paternal grandfather.  ROS:   Please see the history of present illness.    *** All other systems reviewed and are negative.   Prior CV studies:   The following studies were reviewed today:  Echocardiogram 07/14/2018 1. The left ventricle has normal systolic function with an ejection  fraction of 60-65%. The cavity size was normal. There is moderately increased left ventricular wall thickness. Left ventricular diastolic parameters were normal.  2. The right ventricle has normal systolic function. The cavity was normal. There is no increase in right ventricular wall thickness.  3. The tricuspid valve is grossly normal.  4. The aortic valve is tricuspid. Mild thickening of the aortic valve. Mild calcification of the aortic valve.  Labs/Other Tests and Data Reviewed:    EKG:  {EKG/Telemetry Strips Reviewed:[phone]}  Recent Labs: 06/27/2018: ALT 15; BUN 9; Creatinine, Ser 0.72; Hemoglobin 9.3; Platelets 376.0; Potassium 3.7; Sodium 136; TSH 2.09   Recent Lipid Panel Lab Results  Component Value Date/Time   CHOL 135 06/27/2018 09:40 AM   TRIG 104.0 06/27/2018 09:40 AM   HDL 59.90 06/27/2018 09:40 AM   CHOLHDL 2 06/27/2018 09:40 AM   LDLCALC 55 06/27/2018 09:40 AM   LDLDIRECT 142.5 02/27/2014 10:13 AM    Wt Readings from Last 3 Encounters:  08/02/18 164 lb (74.4 kg)  11/21/17 165 lb (74.8 kg)  11/17/17 165 lb (74.8 kg)     Objective:    Vital Signs:  There were no vitals taken for this visit.   {HeartCare Virtual Exam (Optional):[phone]::"VITAL SIGNS:  reviewed"}  ASSESSMENT & PLAN:    1. ***  COVID-19 Education: The signs and symptoms of COVID-19 were discussed with the patient and how to seek care for testing (follow up with PCP or arrange E-visit).  ***The importance of social distancing was discussed today.  Time:   Today, I have spent *** minutes with the patient with telehealth technology discussing the above problems.     Medication Adjustments/Labs and Tests Ordered: Current medicines are reviewed at length with the patient today.  Concerns regarding medicines are outlined above.   Tests Ordered: No orders of the defined types were placed in this encounter.   Medication Changes: No orders of the defined types were placed in this  encounter.   Disposition:  Follow up {follow up:15908}  Signed, Phill Myron. West Pugh, ANP, AACC  09/03/2018 3:27 PM    [facility]

## 2018-09-04 ENCOUNTER — Telehealth: Payer: Self-pay

## 2018-09-04 ENCOUNTER — Telehealth: Payer: Medicare Other | Admitting: Adult Health

## 2018-09-04 NOTE — Telephone Encounter (Signed)
Made in error

## 2018-09-12 ENCOUNTER — Telehealth (INDEPENDENT_AMBULATORY_CARE_PROVIDER_SITE_OTHER): Payer: Medicare Other | Admitting: Cardiovascular Disease

## 2018-09-12 ENCOUNTER — Encounter: Payer: Self-pay | Admitting: Cardiovascular Disease

## 2018-09-12 VITALS — BP 136/75 | HR 69 | Ht 70.0 in

## 2018-09-12 DIAGNOSIS — I2510 Atherosclerotic heart disease of native coronary artery without angina pectoris: Secondary | ICD-10-CM

## 2018-09-12 DIAGNOSIS — E119 Type 2 diabetes mellitus without complications: Secondary | ICD-10-CM | POA: Diagnosis not present

## 2018-09-12 DIAGNOSIS — I10 Essential (primary) hypertension: Secondary | ICD-10-CM

## 2018-09-12 DIAGNOSIS — Z9861 Coronary angioplasty status: Secondary | ICD-10-CM

## 2018-09-12 DIAGNOSIS — E782 Mixed hyperlipidemia: Secondary | ICD-10-CM | POA: Diagnosis not present

## 2018-09-12 DIAGNOSIS — D508 Other iron deficiency anemias: Secondary | ICD-10-CM

## 2018-09-12 MED ORDER — CARVEDILOL 12.5 MG PO TABS: 12.5000 mg | ORAL_TABLET | Freq: Two times a day (BID) | ORAL | 3 refills | Status: DC

## 2018-09-12 NOTE — Progress Notes (Signed)
Virtual Visit via Video Note   This visit type was conducted due to national recommendations for restrictions regarding the COVID-19 Pandemic (e.g. social distancing) in an effort to limit this patient's exposure and mitigate transmission in our community.  Due to his co-morbid illnesses, this patient is at least at moderate risk for complications without adequate follow up.  This format is felt to be most appropriate for this patient at this time.  All issues noted in this document were discussed and addressed.  A limited physical exam was performed with this format.  Please refer to the patient's chart for his consent to telehealth for [facility].   Date:  09/12/2018   ID:  Phillip [name], DOB 07-24-1945, MRN [id number]  Patient Location: Home Provider Location: Office  PCP:  Biagio Borg, MD  Cardiologist:  Sanda Klein, MD  Electrophysiologist:  None   Evaluation Performed:  Follow-Up Visit  Chief Complaint:  CAD  History of Present Illness:    Phillip [name] is a 73 y.o. male with CAD, HTN, mixed hyperlipidemia.  He has done well over the last year.  He does not have problems with chest pain or dyspnea.  He played 27 holes of golf the other day and did fine except for feeling a little dizzy due to the heat.  His carvedilol has been causing fatigue and he cut the dose back in half.  (He misunderstood instructions and was taking a medication once a day instead of half dose twice a day).  He has a lot of bruising but no overt bleeding.  He remains moderately anemic with a hemoglobin of 9.3 in May.  He is tolerating rosuvastatin without side effects and has an excellent lipid profile.  He continues to drink 2 or 3 "red neck martinis" most nights, but this is substantially less than in the past.  He underwent an echocardiogram last month which showed improvement in left ventricular systolic function, EF is now 60-65%.  There is no mention of wall motion abnormalities and even  diastolic function was estimated to be normal.  By comparison, his nuclear stress test from April 2019 showed LVEF of 45% and suggestion of an old inferior scar.  The patient does not have symptoms concerning for COVID-19 infection (fever, chills, cough, or new shortness of breath).  Last week he went to a wedding with about 100 guests, indoors, no one was wearing masks.   Past Medical History:  Diagnosis Date  . Anemia   . Anxiety   . Arthritis   . Bipolar depression ([place])   . CAD, multiple vessel   . Chronic pain disorder    chronic bilat shoulder pain and feet pain  . Depression   . Erectile dysfunction 01/17/2013  . GERD (gastroesophageal reflux disease)   . History of colon polyps   . Hyperlipidemia   . Hypertension   . Insomnia   . Iron deficiency anemia 01/16/2013  . Mixed hyperlipidemia 03/08/2013  . Status post insertion of drug-eluting stent into left anterior descending (LAD) artery for coronary artery disease   . Stented coronary artery 01/16/2013   X 2  . Systolic murmur   . Unspecified vitamin D deficiency 01/17/2013   Past Surgical History:  Procedure Laterality Date  . CAROTID STENT  2008  . CORONARY ANGIOPLASTY WITH STENT PLACEMENT  about 2014   current Cardiology: Dr Orene Desanctis  . EYE SURGERY       Current Meds  Medication Sig  . ALPRAZolam Duanne Moron)  1 MG tablet TAKE 1/2 TO 1 TABLET BY MOUTH TWICE DAILY AS NEEDED  . amLODipine (NORVASC) 5 MG tablet Take 1 tablet (5 mg total) by mouth daily.  Marland Kitchen aspirin 81 MG tablet Take 81 mg by mouth daily.  . carbamazepine (TEGRETOL) 200 MG tablet TAKE 1 TABLET BY MOUTH TWICE DAILY (Patient taking differently: TAKE 100MG  BY MOUTH TWICE DAILY)  . carvedilol (COREG) 12.5 MG tablet Take 1 tablet (12.5 mg total) by mouth 2 (two) times daily with a meal.  . clobetasol cream (TEMOVATE) 0.05 % APPLY TOPICALLY TWICE DAILY AS NEEDED  . Cyanocobalamin (VITAMIN B 12 PO) Take by mouth.  . ferrous sulfate 325 (65 FE) MG EC tablet Take 1  tablet (325 mg total) by mouth 2 (two) times a day.  . folic acid (FOLVITE) 1 MG tablet TAKE 1 TABLET BY MOUTH DAILY  . HYDROcodone-acetaminophen (NORCO/VICODIN) 5-325 MG tablet Take 1 tablet by mouth every 6 (six) hours as needed for moderate pain.  . hydroxychloroquine (PLAQUENIL) 200 MG tablet TAKE 1/2 TABLET BY MOUTH DAILY  . hydrOXYzine (ATARAX/VISTARIL) 25 MG tablet TAKE 1 TABLET BY MOUTH 3 TIMES DAILY AS NEEDED  . leflunomide (ARAVA) 20 MG tablet Take 10 mg by mouth 2 (two) times daily.   . nitroGLYCERIN (NITROSTAT) 0.4 MG SL tablet Place 1 tablet (0.4 mg total) under the tongue every 5 (five) minutes as needed for chest pain.  . Omega-3 Fatty Acids (FISH OIL PO) Take by mouth daily.  . pantoprazole (PROTONIX) 40 MG tablet TAKE 1 TABLET BY MOUTH TWICE DAILY  . predniSONE (DELTASONE) 10 MG tablet TAKE 1 TABLET BY MOUTH DAILY  . ramipril (ALTACE) 2.5 MG capsule TAKE 1 CAPSULE BY MOUTH DAILY  . rosuvastatin (CRESTOR) 20 MG tablet TAKE 1 TABLET BY MOUTH DAILY  . sildenafil (REVATIO) 20 MG tablet Take 3 pills daily as needed  . traZODone (DESYREL) 50 MG tablet TAKE 1/2 TO 1 TABLET BY MOUTH AT BEDTIMEFOR SLEEP  . triamcinolone (NASACORT AQ) 55 MCG/ACT AERO nasal inhaler Place 2 sprays into the nose daily.  . [DISCONTINUED] carvedilol (COREG) 25 MG tablet TAKE 1 TABLET BY MOUTH TWICE DAILY WITH MEALS  . [DISCONTINUED] clopidogrel (PLAVIX) 75 MG tablet TAKE 1 TABLET BY MOUTH DAILY  . [DISCONTINUED] fenofibrate 54 MG tablet TAKE 1 TABLET BY MOUTH DAILY   Current Facility-Administered Medications for the 09/12/18 encounter (Telemedicine) with Sanda Klein, MD  Medication  . triamcinolone acetonide (KENALOG) 10 MG/ML injection 10 mg     Allergies:   Lipitor [atorvastatin]   Social History   Tobacco Use  . Smoking status: Former Smoker    Packs/day: 1.00    Years: 20.00    Pack years: 20.00    Types: Cigarettes    Quit date: 02/22/2002    Years since quitting: 16.5  . Smokeless  tobacco: Current User    Types: Chew  Substance Use Topics  . Alcohol use: Yes  . Drug use: No     Family Hx: The patient's family history includes Alzheimer's disease in his brother; Cancer in his maternal grandfather, maternal grandmother, and mother; Heart disease in his maternal grandmother, mother, and paternal grandfather; Hyperlipidemia in his maternal grandmother; Lung cancer in his father; Stroke in his maternal grandmother and paternal grandfather.  ROS:   Please see the history of present illness.    All other systems reviewed and are negative.   Prior CV studies:   The following studies were reviewed today:  Echo August 14, 2018  Labs/Other Tests and Data Reviewed:    EKG:  An ECG dated 08/02/2018 was personally reviewed today and demonstrated:  Normal sinus rhythm, normal tracing  Recent Labs: 06/27/2018: ALT 15; BUN 9; Creatinine, Ser 0.72; Hemoglobin 9.3; Platelets 376.0; Potassium 3.7; Sodium 136; TSH 2.09   Recent Lipid Panel Lab Results  Component Value Date/Time   CHOL 135 06/27/2018 09:40 AM   TRIG 104.0 06/27/2018 09:40 AM   HDL 59.90 06/27/2018 09:40 AM   CHOLHDL 2 06/27/2018 09:40 AM   LDLCALC 55 06/27/2018 09:40 AM   LDLDIRECT 142.5 02/27/2014 10:13 AM    Wt Readings from Last 3 Encounters:  08/02/18 164 lb (74.4 kg)  11/21/17 165 lb (74.8 kg)  11/17/17 165 lb (74.8 kg)     Objective:    Vital Signs:  BP 136/75   Pulse 69   Ht 5\' 10"  (1.778 m)   BMI 23.53 kg/m    VITAL SIGNS:  reviewed GEN:  no acute distress EYES:  sclerae anicteric, EOMI - Extraocular Movements Intact RESPIRATORY:  normal respiratory effort, symmetric expansion CARDIOVASCULAR:  no peripheral edema SKIN:  no rash, lesions or ulcers. MUSCULOSKELETAL:  no obvious deformities. NEURO:  alert and oriented x 3, no obvious focal deficit PSYCH:  normal affect  ASSESSMENT & PLAN:    1. CAD: Asymptomatic.  Most recent functional study was low risk.  Risk factors are generally  well addressed.  Stop clopidogrel.  He appears to have iron deficiency anemia and at this point the benefit from continued clopidogrel therapy is low. 2.  HTN: Generally well controlled.  Take carvedilol 12.5 mg twice daily. 3.  HLP: LDL is at target less than 70.  Excellent triglyceride level.  Stop fenofibrate (we had given him the same instructions since September but I do not think he understood them).  Advised him that the new guidelines state that alcohol should be limited to no more than 7 drinks a week, but he told me "that's not happening". 4. Anemia: Work-up in progress.  Stop clopidogrel.  COVID-19 Education: The signs and symptoms of COVID-19 were discussed with the patient and how to seek care for testing (follow up with PCP or arrange E-visit).  The importance of social distancing was discussed today.  Time:   Today, I have spent 20 minutes with the patient with telehealth technology discussing the above problems.     Medication Adjustments/Labs and Tests Ordered: Current medicines are reviewed at length with the patient today.  Concerns regarding medicines are outlined above.   Tests Ordered: No orders of the defined types were placed in this encounter.   Medication Changes: Meds ordered this encounter  Medications  . carvedilol (COREG) 12.5 MG tablet    Sig: Take 1 tablet (12.5 mg total) by mouth 2 (two) times daily with a meal.    Dispense:  90 tablet    Refill:  3    Follow Up:  In Person 1 year  Signed, Sanda Klein, MD  09/12/2018 10:57 AM    [place]

## 2018-09-12 NOTE — Patient Instructions (Signed)
Medication Instructions:  STOP the Fenofibrate STOP the Clopidogrel (plavix) DECREASE the Carvedilol to 12.5 mg twice daily  If you need a refill on your cardiac medications before your next appointment, please call your pharmacy.   Lab work: None ordered If you have labs (blood work) drawn today and your tests are completely normal, you will receive your results only by: Marland Kitchen MyChart Message (if you have MyChart) OR . A paper copy in the mail If you have any lab test that is abnormal or we need to change your treatment, we will call you to review the results.  Testing/Procedures: None ordered  Follow-Up: At [facility], you and your health needs are our priority.  As part of our continuing mission to provide you with exceptional heart care, we have created designated Provider Care Teams.  These Care Teams include your primary Cardiologist (physician) and Advanced Practice Providers (APPs -  Physician Assistants and Nurse Practitioners) who all work together to provide you with the care you need, when you need it. You will need a follow up appointment in 12 months.  Please call our office 2 months in advance to schedule this appointment.  You may see Sanda Klein, MD or one of the following Advanced Practice Providers on your designated Care Team: [place], [place] . Fabian Sharp, PA-C

## 2018-09-14 DIAGNOSIS — Z0120 Encounter for dental examination and cleaning without abnormal findings: Secondary | ICD-10-CM | POA: Diagnosis not present

## 2018-10-13 ENCOUNTER — Telehealth: Payer: Self-pay | Admitting: Cardiovascular Disease

## 2018-10-13 ENCOUNTER — Encounter: Payer: Self-pay | Admitting: Internal Medicine

## 2018-10-13 NOTE — Telephone Encounter (Signed)
New Message   Pt c/o BP issue: STAT if pt c/o blurred vision, one-sided weakness or slurred speech  1. What are your last 5 BP readings? 168/84 hr 64, 204/71,    2. Are you having any other symptoms (ex. Dizziness, headache, blurred vision, passed out)? No  3. What is your BP issue? Blood pressure is elevated, patient wants to know can he increase his medicine dosage.

## 2018-10-13 NOTE — Telephone Encounter (Signed)
LM2CB 

## 2018-10-18 ENCOUNTER — Other Ambulatory Visit: Payer: Self-pay | Admitting: Internal Medicine

## 2018-10-19 ENCOUNTER — Telehealth: Payer: Self-pay | Admitting: Cardiovascular Disease

## 2018-10-19 NOTE — Telephone Encounter (Signed)
Follow Up   Patient is returning call. Please give patient call back.

## 2018-10-19 NOTE — Telephone Encounter (Signed)
Called back, LVM with call back number to discuss.

## 2018-10-19 NOTE — Telephone Encounter (Signed)
Lm to call back ./cy 

## 2018-10-19 NOTE — Telephone Encounter (Signed)
Pharmacy aware that pt should be on 5 mg at this time.Unable to reach pt re previous message about elevated B/P./cy

## 2018-10-19 NOTE — Telephone Encounter (Signed)
New Message   Pt c/o medication issue:  1. Name of Medication: amLODipine (NORVASC) 5 MG tablet    2. How are you currently taking this medication (dosage and times per day)?   3. Are you having a reaction (difficulty breathing--STAT)?  4. What is your medication issue? Pharmacy is calling on behalf of patient. She states that the patient has been taken 10mg  a day so 2 of the 5mg  tablets. She is wanting to know can an updated rx be sent for 10mg  or should the patient be taking 5mg . Please call

## 2018-10-25 ENCOUNTER — Telehealth: Payer: Self-pay

## 2018-10-25 ENCOUNTER — Other Ambulatory Visit: Payer: Self-pay | Admitting: Internal Medicine

## 2018-10-25 MED ORDER — RAMIPRIL 2.5 MG PO CAPS: 2.5000 mg | ORAL_CAPSULE | Freq: Every day | ORAL | 6 refills | Status: DC

## 2018-10-25 NOTE — Telephone Encounter (Signed)
Phillip [name], Phillip Gobble, MD  Waylan Rocher, LPN; Ricci Barker, RN Phone Number: [phone] In all of [place] , BP is usually highest in the early morning.  Another reason might be that the meds wear off a little faster than the average. I would suggest increasing the ramipril to 5 mg daily. It is a long-acting medication. It may take a couple of weeks to see the full BP effect. Technically we are trying to get the systolic (top) BP under [id number], but since he tends to run a low diastolic (bottom) BP, I would be satisfied if we can get the top number 140 or less   Can we call in the higher ramipril dose for him? Ask if he prefers 30/90 day supply, give a year of refills. ----- Called pt discussed Dr Phillip [name]'s recommendation with him. After looking at his medications, he noticed that he has not been taking the ramipril 2.5mg . I have sent an rx for the ramipril 2.5mg  to be restarted and he will continue to log his BP and will let us know how this goes. He will CB if Bp does not improve.

## 2018-10-25 NOTE — Telephone Encounter (Signed)
See other telephone message-this s duplicate message

## 2018-11-25 ENCOUNTER — Other Ambulatory Visit: Payer: Self-pay | Admitting: Internal Medicine

## 2018-11-27 NOTE — Telephone Encounter (Signed)
Done erx 

## 2018-12-21 ENCOUNTER — Telehealth: Payer: Self-pay | Admitting: Internal Medicine

## 2018-12-21 NOTE — Telephone Encounter (Signed)
Done erx 

## 2018-12-27 MED ORDER — ALPRAZOLAM 1 MG PO TABS: ORAL_TABLET | ORAL | 2 refills | Status: DC

## 2018-12-27 NOTE — Telephone Encounter (Signed)
Pt called in again stating pharmacy still has not received prescription refill for this medication. Showing on system as failed electronically. Please advise and resend.

## 2018-12-27 NOTE — Telephone Encounter (Signed)
Done again

## 2019-01-10 ENCOUNTER — Other Ambulatory Visit: Payer: Self-pay | Admitting: Cardiovascular Disease

## 2019-01-10 DIAGNOSIS — E785 Hyperlipidemia, unspecified: Secondary | ICD-10-CM

## 2019-01-11 NOTE — Telephone Encounter (Signed)
Rx(s) sent to pharmacy electronically.  

## 2019-01-16 ENCOUNTER — Other Ambulatory Visit: Payer: Self-pay | Admitting: Internal Medicine

## 2019-01-16 MED ORDER — PREDNISONE 10 MG PO TABS: 10.0000 mg | ORAL_TABLET | Freq: Every day | ORAL | 1 refills | Status: DC

## 2019-01-16 NOTE — Telephone Encounter (Signed)
Medication Refill - Medication:  predniSONE (DELTASONE) 10 MG tablet   Has the patient contacted their pharmacy?  Yes advised to call office.   Preferred Pharmacy (with phone number or street name):  [facility] - [place], [place]. [phone] (Phone) [phone] (Fax)   Agent: Please be advised that RX refills may take up to 3 business days. We ask that you follow-up with your pharmacy.

## 2019-01-16 NOTE — Telephone Encounter (Signed)
Routing to CMA 

## 2019-01-16 NOTE — Telephone Encounter (Signed)
Requested medication (s) are due for refill today: no  Requested medication (s) are on the active medication list: yes  Last refill:  07/19/2018  Future visit scheduled: yes  Notes to clinic: refill cannot be delegated    Requested Prescriptions  Pending Prescriptions Disp Refills   predniSONE (DELTASONE) 10 MG tablet 90 tablet 1    Sig: Take 1 tablet (10 mg total) by mouth daily.     Not Delegated - Endocrinology:  Oral Corticosteroids Failed - 01/16/2019 10:31 AM      Failed - This refill cannot be delegated      Failed - Valid encounter within last 6 months    Recent Outpatient Visits          6 months ago [facility] -Georges Mouse, MD   1 year ago Preventative health care   [facility] -Georges Mouse, MD   1 year ago Elevated blood sugar   [place] John, Andri W, MD   2 years ago Rheumatoid arthritis, involving unspecified site, unspecified rheumatoid factor presence [facility])   [place] John, Leelyn W, MD   2 years ago Encounter for well adult exam with abnormal findings   [place], MD      Future Appointments            In 6 days Biagio Borg, MD [place], [place] BP in normal range    BP Readings from Last 1 Encounters:  09/12/18 136/75

## 2019-01-22 ENCOUNTER — Other Ambulatory Visit (INDEPENDENT_AMBULATORY_CARE_PROVIDER_SITE_OTHER): Payer: Medicare Other

## 2019-01-22 ENCOUNTER — Ambulatory Visit (INDEPENDENT_AMBULATORY_CARE_PROVIDER_SITE_OTHER): Payer: Medicare Other | Admitting: Internal Medicine

## 2019-01-22 ENCOUNTER — Encounter: Payer: Self-pay | Admitting: Internal Medicine

## 2019-01-22 ENCOUNTER — Telehealth: Payer: Self-pay | Admitting: Internal Medicine

## 2019-01-22 ENCOUNTER — Other Ambulatory Visit: Payer: Self-pay | Admitting: Internal Medicine

## 2019-01-22 ENCOUNTER — Other Ambulatory Visit: Payer: Self-pay

## 2019-01-22 VITALS — BP 162/80 | HR 68 | Temp 97.8°F | Ht 70.0 in | Wt 170.0 lb

## 2019-01-22 DIAGNOSIS — E119 Type 2 diabetes mellitus without complications: Secondary | ICD-10-CM | POA: Diagnosis not present

## 2019-01-22 DIAGNOSIS — Z125 Encounter for screening for malignant neoplasm of prostate: Secondary | ICD-10-CM

## 2019-01-22 DIAGNOSIS — Z0001 Encounter for general adult medical examination with abnormal findings: Secondary | ICD-10-CM

## 2019-01-22 DIAGNOSIS — D508 Other iron deficiency anemias: Secondary | ICD-10-CM

## 2019-01-22 DIAGNOSIS — Z0000 Encounter for general adult medical examination without abnormal findings: Secondary | ICD-10-CM | POA: Diagnosis not present

## 2019-01-22 DIAGNOSIS — M542 Cervicalgia: Secondary | ICD-10-CM | POA: Insufficient documentation

## 2019-01-22 DIAGNOSIS — I10 Essential (primary) hypertension: Secondary | ICD-10-CM

## 2019-01-22 DIAGNOSIS — N529 Male erectile dysfunction, unspecified: Secondary | ICD-10-CM

## 2019-01-22 DIAGNOSIS — M069 Rheumatoid arthritis, unspecified: Secondary | ICD-10-CM

## 2019-01-22 HISTORY — DX: Cervicalgia: M54.2

## 2019-01-22 LAB — URINALYSIS, ROUTINE W REFLEX MICROSCOPIC
Bilirubin Urine: NEGATIVE
Hgb urine dipstick: NEGATIVE
Ketones, ur: NEGATIVE
Leukocytes,Ua: NEGATIVE
Nitrite: NEGATIVE
RBC / HPF: NONE SEEN (ref 0–?)
Specific Gravity, Urine: 1.01 (ref 1.000–1.030)
Total Protein, Urine: NEGATIVE
Urine Glucose: 250 — AB
Urobilinogen, UA: 0.2 (ref 0.0–1.0)
WBC, UA: NONE SEEN (ref 0–?)
pH: 7.5 (ref 5.0–8.0)

## 2019-01-22 LAB — CBC WITH DIFFERENTIAL/PLATELET
Basophils Absolute: 0 10*3/uL (ref 0.0–0.1)
Basophils Relative: 0.4 % (ref 0.0–3.0)
Eosinophils Absolute: 0 10*3/uL (ref 0.0–0.7)
Eosinophils Relative: 0.5 % (ref 0.0–5.0)
HCT: 38 % — ABNORMAL LOW (ref 39.0–52.0)
Hemoglobin: 12.6 g/dL — ABNORMAL LOW (ref 13.0–17.0)
Lymphocytes Relative: 13 % (ref 12.0–46.0)
Lymphs Abs: 0.9 10*3/uL (ref 0.7–4.0)
MCHC: 33.1 g/dL (ref 30.0–36.0)
MCV: 90.5 fl (ref 78.0–100.0)
Monocytes Absolute: 0.3 10*3/uL (ref 0.1–1.0)
Monocytes Relative: 4 % (ref 3.0–12.0)
Neutro Abs: 5.5 10*3/uL (ref 1.4–7.7)
Neutrophils Relative %: 82.1 % — ABNORMAL HIGH (ref 43.0–77.0)
Platelets: 310 10*3/uL (ref 150.0–400.0)
RBC: 4.19 Mil/uL — ABNORMAL LOW (ref 4.22–5.81)
RDW: 13.8 % (ref 11.5–15.5)
WBC: 6.7 10*3/uL (ref 4.0–10.5)

## 2019-01-22 LAB — HEPATIC FUNCTION PANEL
ALT: 24 U/L (ref 0–53)
AST: 21 U/L (ref 0–37)
Albumin: 4.1 g/dL (ref 3.5–5.2)
Alkaline Phosphatase: 63 U/L (ref 39–117)
Bilirubin, Direct: 0.1 mg/dL (ref 0.0–0.3)
Total Bilirubin: 0.3 mg/dL (ref 0.2–1.2)
Total Protein: 7.1 g/dL (ref 6.0–8.3)

## 2019-01-22 LAB — BASIC METABOLIC PANEL
BUN: 8 mg/dL (ref 6–23)
CO2: 29 mEq/L (ref 19–32)
Calcium: 9.3 mg/dL (ref 8.4–10.5)
Chloride: 98 mEq/L (ref 96–112)
Creatinine, Ser: 0.66 mg/dL (ref 0.40–1.50)
GFR: 118.07 mL/min (ref >60.00)
Glucose, Bld: 177 mg/dL — ABNORMAL HIGH (ref 70–99)
Potassium: 4.1 mEq/L (ref 3.5–5.1)
Sodium: 136 mEq/L (ref 135–145)

## 2019-01-22 LAB — LIPID PANEL
Cholesterol: 176 mg/dL (ref 0–200)
HDL: 77.6 mg/dL (ref >39.00)
LDL Cholesterol: 79 mg/dL (ref 0–99)
NonHDL: 98.39
Total CHOL/HDL Ratio: 2
Triglycerides: 98 mg/dL (ref 0.0–149.0)
VLDL: 19.6 mg/dL (ref 0.0–40.0)

## 2019-01-22 LAB — FERRITIN: Ferritin: 34.9 ng/mL (ref 22.0–322.0)

## 2019-01-22 LAB — MICROALBUMIN / CREATININE URINE RATIO
Creatinine,U: 35 mg/dL
Microalb Creat Ratio: 2 mg/g (ref 0.0–30.0)
Microalb, Ur: 0.7 mg/dL (ref 0.0–1.9)

## 2019-01-22 LAB — IBC PANEL
Iron: 114 ug/dL (ref 42–165)
Saturation Ratios: 34.4 % (ref 20.0–50.0)
Transferrin: 237 mg/dL (ref 212.0–360.0)

## 2019-01-22 LAB — HEMOGLOBIN A1C: Hgb A1c MFr Bld: 7 % — ABNORMAL HIGH (ref 4.6–6.5)

## 2019-01-22 LAB — TSH: TSH: 0.96 u[IU]/mL (ref 0.35–4.50)

## 2019-01-22 LAB — PSA: PSA: 0.73 ng/mL (ref 0.10–4.00)

## 2019-01-22 MED ORDER — HYDROXYCHLOROQUINE SULFATE 200 MG PO TABS: 100.0000 mg | ORAL_TABLET | Freq: Every day | ORAL | 1 refills | Status: DC

## 2019-01-22 MED ORDER — SILDENAFIL CITRATE 20 MG PO TABS: ORAL_TABLET | ORAL | 2 refills | Status: DC

## 2019-01-22 NOTE — Assessment & Plan Note (Signed)
Ok for cialis prn,  to f/u any worsening symptoms or concerns  

## 2019-01-22 NOTE — Assessment & Plan Note (Addendum)
For f/u iron labs, and refer GI  In addition to the time spent performing CPE, I spent an additional 25 minutes face to face,in which greater than 50% of this time was spent in counseling and coordination of care for patient's acute illness as documented, including the differential dx, treatment, further evaluation and other management of iron deficiency anemia, RA, post neck pain, HTn, DM, and ED

## 2019-01-22 NOTE — Patient Instructions (Signed)
Please remember to get your second Shingrix shot on or about Dec 16 at the pharmacy  Please take all new medication as prescribed  - the cialis as needed  Please continue all other medications as before, and refills have been done if requested including the plaquinil, but remember to see Rheumatology for further refills  Please have the pharmacy call with any other refills you may need.  Please continue your efforts at being more active, low cholesterol diet, and weight control.  You are otherwise up to date with prevention measures today.  Please keep your appointments with your specialists as you may have planned  You will be contacted regarding the referral for: [place], and Gastroenterology  Please go to the LAB in the Basement (turn left off the elevator) for the tests to be done today  You will be contacted by phone if any changes need to be made immediately.  Otherwise, you will receive a letter about your results with an explanation, but please check with MyChart first.  Please remember to sign up for MyChart if you have not done so, as this will be important to you in the future with finding out test results, communicating by private email, and scheduling acute appointments online when needed.  Please return in 6 months, or sooner if needed, with Lab testing done 3-5 days before

## 2019-01-22 NOTE — Telephone Encounter (Signed)
Patient came back into the office stating that hydroxychloroquine (PLAQUENIL) 200 MG tablet was sent to the incorrect pharmacy. He would like [facility] sent to - [facility] [address]. He also asked for a prescription of Cialis to be sent as well.  Please advise.

## 2019-01-22 NOTE — Telephone Encounter (Signed)
[facility]ills [facility] to [facility]. See meds.

## 2019-01-22 NOTE — Assessment & Plan Note (Signed)

## 2019-01-22 NOTE — Assessment & Plan Note (Signed)
Ok for bridging plaquinil 200 qd, refer to rheumatology

## 2019-01-22 NOTE — Assessment & Plan Note (Signed)
Minor, c/w likely underlying DJD, for tylenol prn

## 2019-01-22 NOTE — Progress Notes (Signed)
Subjective:    Patient ID: Phillip [name], male    DOB: 05-20-1945, 73 y.o.   MRN: [id number]  HPI  Here for wellness and f/u;  Overall doing ok;  Pt denies Chest pain, worsening SOB, DOE, wheezing, orthopnea, PND, worsening LE edema, palpitations, dizziness or syncope.  Pt denies neurological change such as new headache, facial or extremity weakness.  Pt denies polydipsia, polyuria, or low sugar symptoms. Pt states overall good compliance with treatment and medications, good tolerability, and has been trying to follow appropriate diet.  Pt denies worsening depressive symptoms, suicidal ideation or panic. No fever, night sweats, wt loss, loss of appetite, or other constitutional symptoms.  Pt states good ability with ADL's, has low fall risk, home safety reviewed and adequate, no other significant changes in hearing or vision, and only occasionally active with exercise.  Plans for eye appt next wk. Also, Denies worsening reflux, abd pain, dysphagia, n/v, bowel change or blood. Also, Ed symptoms worsening last 6 mo, asks for ED med, just cant seem to maintain to completion. RA symptoms stable, but ran out of plaquinil x 2 wks, for some reason his refill requests from [facility] do not seem to be addressed.  Also c/o mild intermittent left sided post neck pain, dull, without radation, worse to only certain movements of the head and neck, o/w no pain Past Medical History:  Diagnosis Date  . Anemia   . Anxiety   . Arthritis   . Bipolar depression ([place])   . CAD, multiple vessel   . Chronic pain disorder    chronic bilat shoulder pain and feet pain  . Depression   . Erectile dysfunction 01/17/2013  . GERD (gastroesophageal reflux disease)   . History of colon polyps   . Hyperlipidemia   . Hypertension   . Insomnia   . Iron deficiency anemia 01/16/2013  . Mixed hyperlipidemia 03/08/2013  . Status post insertion of drug-eluting stent into left anterior descending (LAD) artery for coronary artery  disease   . Stented coronary artery 01/16/2013   X 2  . Systolic murmur   . Unspecified vitamin D deficiency 01/17/2013   Past Surgical History:  Procedure Laterality Date  . CAROTID STENT  2008  . CORONARY ANGIOPLASTY WITH STENT PLACEMENT  about 2014   current Cardiology: Dr Orene Desanctis  . EYE SURGERY      reports that he quit smoking about 16 years ago. His smoking use included cigarettes. He has a 20.00 pack-year smoking history. His smokeless tobacco use includes chew. He reports current alcohol use. He reports that he does not use drugs. family history includes Alzheimer's disease in his brother; Cancer in his maternal grandfather, maternal grandmother, and mother; Heart disease in his maternal grandmother, mother, and paternal grandfather; Hyperlipidemia in his maternal grandmother; Lung cancer in his father; Stroke in his maternal grandmother and paternal grandfather. Allergies  Allergen Reactions  . Lipitor [Atorvastatin] Other (See Comments)    myalgia   Current Outpatient Medications on File Prior to Visit  Medication Sig Dispense Refill  . ALPRAZolam (XANAX) 1 MG tablet 1 tab by mouth twice per day as needed 60 tablet 2  . amLODipine (NORVASC) 5 MG tablet Take 1 tablet (5 mg total) by mouth daily. 90 tablet 3  . aspirin 81 MG tablet Take 81 mg by mouth daily.    . carbamazepine (TEGRETOL) 200 MG tablet TAKE 1 TABLET BY MOUTH TWICE DAILY 180 tablet 2  . carvedilol (COREG) 12.5 MG tablet Take  1 tablet (12.5 mg total) by mouth 2 (two) times daily with a meal. 90 tablet 3  . clobetasol cream (TEMOVATE) 0.05 % APPLY TOPICALLY TWICE DAILY AS NEEDED 60 g 1  . Cyanocobalamin (VITAMIN B 12 PO) Take by mouth.    . ferrous sulfate 325 (65 FE) MG EC tablet Take 1 tablet (325 mg total) by mouth 2 (two) times a day. 60 tablet 5  . folic acid (FOLVITE) 1 MG tablet TAKE 1 TABLET BY MOUTH DAILY 90 tablet 0  . HYDROcodone-acetaminophen (NORCO/VICODIN) 5-325 MG tablet Take 1 tablet by mouth every  6 (six) hours as needed for moderate pain. 60 tablet 0  . hydrOXYzine (ATARAX/VISTARIL) 25 MG tablet TAKE 1 TABLET BY MOUTH 3 TIMES DAILY AS NEEDED 270 tablet 1  . leflunomide (ARAVA) 20 MG tablet Take 10 mg by mouth 2 (two) times daily.     . nitroGLYCERIN (NITROSTAT) 0.4 MG SL tablet Place 1 tablet (0.4 mg total) under the tongue every 5 (five) minutes as needed for chest pain. 25 tablet 3  . Omega-3 Fatty Acids (FISH OIL PO) Take by mouth daily.    . pantoprazole (PROTONIX) 40 MG tablet TAKE 1 TABLET BY MOUTH TWICE DAILY 180 tablet 1  . predniSONE (DELTASONE) 10 MG tablet Take 1 tablet (10 mg total) by mouth daily. 90 tablet 1  . ramipril (ALTACE) 2.5 MG capsule Take 1 capsule (2.5 mg total) by mouth daily. 30 capsule 6  . rosuvastatin (CRESTOR) 20 MG tablet Take 1 tablet (20 mg total) by mouth daily. 90 tablet 2  . traZODone (DESYREL) 50 MG tablet TAKE 1/2 TO 1 TABLET BY MOUTH AT BEDTIMEFOR SLEEP 90 tablet 1  . triamcinolone (NASACORT AQ) 55 MCG/ACT AERO nasal inhaler Place 2 sprays into the nose daily. 1 Inhaler 12   Current Facility-Administered Medications on File Prior to Visit  Medication Dose Route Frequency Provider Last Rate Last Dose  . triamcinolone acetonide (KENALOG) 10 MG/ML injection 10 mg  10 mg Other Once Landis Martins, DPM       Review of Systems Constitutional: Negative for other unusual diaphoresis, sweats, appetite or weight changes HENT: Negative for other worsening hearing loss, ear pain, facial swelling, mouth sores or neck stiffness.   Eyes: Negative for other worsening pain, redness or other visual disturbance.  Respiratory: Negative for other stridor or swelling Cardiovascular: Negative for other palpitations or other chest pain  Gastrointestinal: Negative for worsening diarrhea or loose stools, blood in stool, distention or other pain Genitourinary: Negative for hematuria, flank pain or other change in urine volume.  Musculoskeletal: Negative for myalgias or  other joint swelling.  Skin: Negative for other color change, or other wound or worsening drainage.  Neurological: Negative for other syncope or numbness. Hematological: Negative for other adenopathy or swelling Psychiatric/Behavioral: Negative for hallucinations, other worsening agitation, SI, self-injury, or new decreased concentration All otherwise neg per pt    Objective:   Physical Exam BP (!) 162/80 (BP Location: Left Arm, Patient Position: Sitting, Cuff Size: Normal)   Pulse 68   Temp 97.8 F (36.6 C) (Oral)   Ht 5\' 10"  (1.778 m)   Wt 170 lb (77.1 kg)   SpO2 95%   BMI 24.39 kg/m  VS noted,  Constitutional: Pt is oriented to person, place, and time. Appears well-developed and well-nourished, in no significant distress and comfortable Head: Normocephalic and atraumatic  Eyes: Conjunctivae and EOM are normal. Pupils are equal, round, and reactive to light Right Ear: External ear normal  without discharge Left Ear: External ear normal without discharge Nose: Nose without discharge or deformity Mouth/Throat: Oropharynx is without other ulcerations and moist  Neck: Normal range of motion. Neck supple. No JVD present. No tracheal deviation present or significant neck LA or mass Cardiovascular: Normal rate, regular rhythm, normal heart sounds and intact distal pulses.   Pulmonary/Chest: WOB normal and breath sounds without rales or wheezing  Abdominal: Soft. Bowel sounds are normal. NT. No HSM  Musculoskeletal: Normal range of motion. Exhibits no edema Lymphadenopathy: Has no other cervical adenopathy.  Neurological: Pt is alert and oriented to person, place, and time. Pt has normal reflexes. No cranial nerve deficit. Motor grossly intact, Gait intact Skin: Skin is warm and dry. No rash noted or new ulcerations Psychiatric:  Has normal mood and affect. Behavior is normal without agitation All otherwise neg per pt Lab Results  Component Value Date   WBC 6.7 01/22/2019   HGB 12.6  (L) 01/22/2019   HCT 38.0 (L) 01/22/2019   PLT 310.0 01/22/2019   GLUCOSE 177 (H) 01/22/2019   CHOL 176 01/22/2019   TRIG 98.0 01/22/2019   HDL 77.60 01/22/2019   LDLDIRECT 142.5 02/27/2014   LDLCALC 79 01/22/2019   ALT 24 01/22/2019   AST 21 01/22/2019   NA 136 01/22/2019   K 4.1 01/22/2019   CL 98 01/22/2019   CREATININE 0.66 01/22/2019   BUN 8 01/22/2019   CO2 29 01/22/2019   TSH 0.96 01/22/2019   PSA 0.73 01/22/2019   INR 1.0 09/15/2008   HGBA1C 7.0 (H) 01/22/2019   MICROALBUR 0.7 01/22/2019         Assessment & Plan:

## 2019-01-22 NOTE — Assessment & Plan Note (Signed)
stable overall by history and exam, recent data reviewed with pt, and pt to continue medical treatment as before,  to f/u any worsening symptoms or concerns  

## 2019-01-23 ENCOUNTER — Other Ambulatory Visit: Payer: Self-pay | Admitting: Internal Medicine

## 2019-01-23 NOTE — Telephone Encounter (Signed)
Pharmacy comment: Alternative Requested:PATIENT REQUESTING 100MG .

## 2019-01-24 ENCOUNTER — Telehealth: Payer: Self-pay | Admitting: Internal Medicine

## 2019-01-24 NOTE — Telephone Encounter (Signed)
Pt called stating that the medication hydroxychloroquine, was called in incorrectly. Pt states he is supposed to take two pills a day and that he is supposed to have 60 at a time. Pt states the pharmacy only gave him 15 pills. Please advise.     [id number] [address], [place] [address]  [phone] [phone]  Not a 24 hour pharmacy; exact hours not known.   Pt also states the pharmacy is wanting to charge him $300+ for his Viagra. Pt is requesting to have it sent to the new pharmacy. Please advise.    [facility] [place]  - [address]  [address] [place] [address]  Phone: [phone] Fax: [phone]  Not a 24 hour pharmacy; exact hours not known.

## 2019-01-25 MED ORDER — HYDROXYCHLOROQUINE SULFATE 200 MG PO TABS: 400.0000 mg | ORAL_TABLET | Freq: Every day | ORAL | 1 refills | Status: DC

## 2019-01-25 NOTE — Telephone Encounter (Signed)
Ok this is corrected

## 2019-01-25 NOTE — Addendum Note (Signed)
Addended by: Biagio Borg on: 01/25/2019 12:46 PM   Modules accepted: Orders

## 2019-02-05 DIAGNOSIS — R69 Illness, unspecified: Secondary | ICD-10-CM | POA: Diagnosis not present

## 2019-02-05 DIAGNOSIS — H524 Presbyopia: Secondary | ICD-10-CM | POA: Diagnosis not present

## 2019-02-07 ENCOUNTER — Encounter: Payer: Self-pay | Admitting: Internal Medicine

## 2019-02-07 DIAGNOSIS — M150 Primary generalized (osteo)arthritis: Secondary | ICD-10-CM | POA: Diagnosis not present

## 2019-02-07 DIAGNOSIS — M0579 Rheumatoid arthritis with rheumatoid factor of multiple sites without organ or systems involvement: Secondary | ICD-10-CM | POA: Diagnosis not present

## 2019-02-07 DIAGNOSIS — M2550 Pain in unspecified joint: Secondary | ICD-10-CM | POA: Diagnosis not present

## 2019-02-07 DIAGNOSIS — R5382 Chronic fatigue, unspecified: Secondary | ICD-10-CM | POA: Diagnosis not present

## 2019-02-13 ENCOUNTER — Encounter: Payer: Self-pay | Admitting: Adult Health

## 2019-02-13 ENCOUNTER — Other Ambulatory Visit: Payer: Self-pay

## 2019-02-13 ENCOUNTER — Telehealth: Payer: Self-pay | Admitting: *Deleted

## 2019-02-13 ENCOUNTER — Ambulatory Visit: Payer: Medicare Other | Admitting: Adult Health

## 2019-02-13 VITALS — BP 147/78 | HR 57 | Ht 70.0 in | Wt 167.0 lb

## 2019-02-13 DIAGNOSIS — Z9114 Patient's other noncompliance with medication regimen: Secondary | ICD-10-CM

## 2019-02-13 DIAGNOSIS — F1010 Alcohol abuse, uncomplicated: Secondary | ICD-10-CM

## 2019-02-13 DIAGNOSIS — E782 Mixed hyperlipidemia: Secondary | ICD-10-CM | POA: Diagnosis not present

## 2019-02-13 DIAGNOSIS — I2510 Atherosclerotic heart disease of native coronary artery without angina pectoris: Secondary | ICD-10-CM

## 2019-02-13 DIAGNOSIS — I10 Essential (primary) hypertension: Secondary | ICD-10-CM

## 2019-02-13 NOTE — Patient Instructions (Signed)
Medication Instructions:  TAKE- Amlodipine 5 mg at bedtime TAKE- Carvedilol 12.5 mg every 12 hours TAKE- Ramipril 2.5 mg in the morning  If you need a refill on your cardiac medications before your next appointment, please call your pharmacy.  Labwork: None Ordered   Testing/Procedures: None Ordered  PLEASE READ AND FOLLOW SALTY 6 ATTACHED  Reduce your risk of getting COVID-19 With your heart disease it is especially important for people at increased risk of severe illness from COVID-19, and those who live with them, to protect themselves from getting COVID-19. The best way to protect yourself and to help reduce the spread of the virus that causes COVID-19 is to: Marland Kitchen Limit your interactions with other people as much as possible. . Take precautions to prevent getting COVID-19 when you do interact with others. If you start feeling sick and think you may have COVID-19, get in touch with your healthcare provider within 24 hours.  Follow-Up: IN 3 months Please call our office 2 months in advance, to schedule this appointment. In Person Sanda Klein, MD.    At [facility], you and your health needs are our priority.  As part of our continuing mission to provide you with exceptional heart care, we have created designated Provider Care Teams.  These Care Teams include your primary Cardiologist (physician) and Advanced Practice Providers (APPs -  Physician Assistants and Nurse Practitioners) who all work together to provide you with the care you need, when you need it.  Thank you for choosing [facility] at [facility]!!     Happy Holidays!!

## 2019-02-13 NOTE — Progress Notes (Signed)
Cardiology Office Note   Date:  02/13/2019   ID:  AZMIR MACDERMOTT, DOB [birth year], MRN [id number]  PCP:  Biagio Borg, MD  Cardiologist: Croitoru  Uncontrolled HTN   History of Present Illness: Phillip [name] is a 73 y.o. male who presents for evaluation of chest pain as a walk in.  He has a history of CAD, DES to the LAD in 2011,cardiac catheterization  on June 03, 2006 for elective cardiac catheterization. He was found to have a high-grade ramos lesion, 90%, he underwent stenting with a 3.0 x 28 Cypher stent.  He had residual disease, 50% in  his LAD mid.  30-40% lesions in his RCA, and a 30% lesion in his circumflex, his EF was 55%, his LIMA was widely patent. hypertension, hyperlipidemia, and anemia.   Was last seen in the office by Dr. Sallyanne Kuster on 09/12/2018 and was doing well. Clopidogrel was discontinued. He was also counseled on decreasing his ETOH consumption.  Phillip [name] states that his blood pressures been more elevated over the last several days especially in the morning.  He states his been running [id number] and [id number] systolic.  The patient continues to drink heavily.  He begins with beer around 2 PM and then more heavily into whiskey and "dirty martinis", until he eventually falls asleep.    The patient is not taking his medication as directed in a consistent manner.  He sometimes takes it in the morning, he sometimes takes it in the afternoon, and sometimes takes it in the p.m.  The patient states that he does not always sleep at home as he has several girlfriends, and often stays the night with them and does not bring his medication with him.  He states that he remains active, plays golf, chops wood, and socializes a lot with friends.  He only notices chest pressure when his blood pressure is elevated.  He states that he does not keep his medicines separate, he pours all of them into 1 bottle and takes them by color.  He is uncertain which medicines he takes.    Past Medical History:    Diagnosis Date  . Anemia   . Anxiety   . Arthritis   . Bipolar depression ([place])   . CAD, multiple vessel   . Chronic pain disorder    chronic bilat shoulder pain and feet pain  . Depression   . Erectile dysfunction 01/17/2013  . GERD (gastroesophageal reflux disease)   . History of colon polyps   . Hyperlipidemia   . Hypertension   . Insomnia   . Iron deficiency anemia 01/16/2013  . Mixed hyperlipidemia 03/08/2013  . Posterior neck pain 01/22/2019  . Status post insertion of drug-eluting stent into left anterior descending (LAD) artery for coronary artery disease   . Stented coronary artery 01/16/2013   X 2  . Systolic murmur   . Unspecified vitamin D deficiency 01/17/2013    Past Surgical History:  Procedure Laterality Date  . CAROTID STENT  2008  . CORONARY ANGIOPLASTY WITH STENT PLACEMENT  about 2014   current Cardiology: Dr Orene Desanctis  . EYE SURGERY       Current Outpatient Medications  Medication Sig Dispense Refill  . ALPRAZolam (XANAX) 1 MG tablet 1 tab by mouth twice per day as needed 60 tablet 2  . amLODipine (NORVASC) 5 MG tablet Take 1 tablet (5 mg total) by mouth daily. 90 tablet 3  . aspirin 81 MG tablet Take 81 mg by mouth daily.    Marland Kitchen  carbamazepine (TEGRETOL) 200 MG tablet TAKE 1 TABLET BY MOUTH TWICE DAILY 180 tablet 2  . carvedilol (COREG) 12.5 MG tablet Take 1 tablet (12.5 mg total) by mouth 2 (two) times daily with a meal. 90 tablet 3  . clobetasol cream (TEMOVATE) 0.05 % APPLY TOPICALLY TWICE DAILY AS NEEDED 60 g 1  . Cyanocobalamin (VITAMIN B 12 PO) Take by mouth.    . ferrous sulfate 325 (65 FE) MG EC tablet Take 1 tablet (325 mg total) by mouth 2 (two) times a day. 60 tablet 5  . folic acid (FOLVITE) 1 MG tablet TAKE 1 TABLET BY MOUTH DAILY 90 tablet 0  . HYDROcodone-acetaminophen (NORCO/VICODIN) 5-325 MG tablet Take 1 tablet by mouth every 6 (six) hours as needed for moderate pain. 60 tablet 0  . hydroxychloroquine (PLAQUENIL) 200 MG tablet Take 2  tablets (400 mg total) by mouth daily. 60 tablet 1  . hydrOXYzine (ATARAX/VISTARIL) 25 MG tablet TAKE 1 TABLET BY MOUTH 3 TIMES DAILY AS NEEDED 270 tablet 1  . leflunomide (ARAVA) 20 MG tablet Take 10 mg by mouth 2 (two) times daily.     . nitroGLYCERIN (NITROSTAT) 0.4 MG SL tablet Place 1 tablet (0.4 mg total) under the tongue every 5 (five) minutes as needed for chest pain. 25 tablet 3  . Omega-3 Fatty Acids (FISH OIL PO) Take by mouth daily.    . pantoprazole (PROTONIX) 40 MG tablet TAKE 1 TABLET BY MOUTH TWICE DAILY 180 tablet 3  . predniSONE (DELTASONE) 10 MG tablet Take 1 tablet (10 mg total) by mouth daily. 90 tablet 1  . ramipril (ALTACE) 2.5 MG capsule Take 1 capsule (2.5 mg total) by mouth daily. 30 capsule 6  . rosuvastatin (CRESTOR) 20 MG tablet Take 1 tablet (20 mg total) by mouth daily. 90 tablet 2  . sildenafil (REVATIO) 20 MG tablet Take 3 pills daily as needed 60 tablet 2  . traZODone (DESYREL) 50 MG tablet TAKE 1/2 TO 1 TABLET BY MOUTH AT BEDTIMEFOR SLEEP 90 tablet 1  . triamcinolone (NASACORT AQ) 55 MCG/ACT AERO nasal inhaler Place 2 sprays into the nose daily. 1 Inhaler 12   Current Facility-Administered Medications  Medication Dose Route Frequency Provider Last Rate Last Admin  . triamcinolone acetonide (KENALOG) 10 MG/ML injection 10 mg  10 mg Other Once Landis Martins, DPM        Allergies:   Lipitor [atorvastatin]    Social History:  The patient  reports that he quit smoking about 16 years ago. His smoking use included cigarettes. He has a 20.00 pack-year smoking history. His smokeless tobacco use includes chew. He reports current alcohol use. He reports that he does not use drugs.   Family History:  The patient's family history includes Alzheimer's disease in his brother; Cancer in his maternal grandfather, maternal grandmother, and mother; Heart disease in his maternal grandmother, mother, and paternal grandfather; Hyperlipidemia in his maternal grandmother; Lung  cancer in his father; Stroke in his maternal grandmother and paternal grandfather.    ROS: All other systems are reviewed and negative. Unless otherwise mentioned in H&P    PHYSICAL EXAM: VS:  BP (!) 147/78   Pulse (!) 57   Ht 5\' 10"  (1.778 m)   Wt 167 lb (75.8 kg)   SpO2 97%   BMI 23.96 kg/m  , BMI Body mass index is 23.96 kg/m. GEN: Well nourished, well developed, in no acute distress HEENT: normal Neck: no JVD, carotid bruits, or masses Cardiac: RRR; no murmurs, rubs,  or gallops,no edema  Respiratory:  Clear to auscultation bilaterally, normal work of breathing GI: soft, nontender, nondistended, + BS MS: no deformity or atrophy Skin: warm and dry, no rash Neuro:  Strength and sensation are intact Psych: euthymic mood, full affect   EKG: Sinus bradycardia heart rate 57 bpm  Recent Labs: 01/22/2019: ALT 24; BUN 8; Creatinine, Ser 0.66; Hemoglobin 12.6; Platelets 310.0; Potassium 4.1; Sodium 136; TSH 0.96    Lipid Panel    Component Value Date/Time   CHOL 176 01/22/2019 1544   TRIG 98.0 01/22/2019 1544   HDL 77.60 01/22/2019 1544   CHOLHDL 2 01/22/2019 1544   VLDL 19.6 01/22/2019 1544   LDLCALC 79 01/22/2019 1544   LDLDIRECT 142.5 02/27/2014 1013      Wt Readings from Last 3 Encounters:  02/13/19 167 lb (75.8 kg)  01/22/19 170 lb (77.1 kg)  08/02/18 164 lb (74.4 kg)      Other studies Reviewed: Echocardiogram 08/21/2018 1. The left ventricle has normal systolic function with an ejection fraction of 60-65%. The cavity size was normal. There is moderately increased left ventricular wall thickness. Left ventricular diastolic parameters were normal.  2. The right ventricle has normal systolic function. The cavity was normal. There is no increase in right ventricular wall thickness.  3. The tricuspid valve is grossly normal.  4. The aortic valve is tricuspid. Mild thickening of the aortic valve. Mild calcification of the aortic valve.  NM Stress Test 06/21/2017    Study Highlights    The left ventricular ejection fraction is mildly decreased (45-54%).  Nuclear stress EF: 45%.  There was no ST segment deviation noted during stress.  Findings consistent with prior myocardial infarction.  This is an intermediate risk study.   Small inferior wall infarct from apex to base with no ischemia EF 45% with apical hypokinesis      ASSESSMENT AND PLAN:  1. Uncontrolled Hypertension: Multifactorial in the setting of medical non-compliance, inconsistent dosage times, combined with ETOH consumption. I have counseled him on consistency in taking the medications at the same time every day. He is not inclined to use a pill dispenser because he is not always at home each evening and will forget to take it with him. We dicussed a pill pack but he is reluctant to use this method.  No changes in his regimen, but strong recommendations that he take it consistently.   I have asked him to be more diligent on medication consistency but I am doubtful that he will be consistent with this as long as he continues to drink heavily.  I have explained the dangers of taking his medications with ETOH. He verbalizes understanding. This is a challenging situation with difficultly in finding solutions if the patient does not begin to take more responsibility for his own health maintenance.   2. 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 He begins around 2 pm with beer and graduates to whiskey in the late afternoon. I have talked to him about alcoholic CM and symptoms of CHF. He verbalizes understanding.  I have let him know there is help for him if he wants to quit drinking.   3. CAD: Stents to LAD and ramus. He is no longer on antiplatelet therapy. Will need to have follow up echo after next visit.   4. Hypercholesterolemia:Continue rosuvastatin. Follow up labs on next office visit.   Current medicines are reviewed at  length with the patient  today.  I have spent over 35 minutes with this patient. He was a walk in today.   Labs/ tests ordered today include: None  Phillip [name], ANP, AACC   02/13/2019 1:57 PM    Phillip [name]  Notice: This dictation was prepared with Dragon dictation along with smaller phrase technology. Any transcriptional errors that result from this process are unintentional and may not be corrected upon review.

## 2019-02-13 NOTE — Telephone Encounter (Signed)
The patient came in as a walk in. He stated that 2 days ago he felt chest pressure that lasted 2-3 hours. He stated that it was not relieved when he ambulated. He did not take a nitroglycerin.  He stated that when he took his blood pressure, systolic was [id number]. He denies any other symptoms.  He stated that he has not had any chest pressure since then but his blood pressure has stayed elevated.  Today in the office it was 153/77 with a heart rate of 61.  He has been made an appointment today with Jory Sims, DNP.

## 2019-04-04 ENCOUNTER — Other Ambulatory Visit: Payer: Self-pay | Admitting: Internal Medicine

## 2019-04-30 ENCOUNTER — Telehealth: Payer: Medicare Other | Admitting: Medical

## 2019-04-30 ENCOUNTER — Telehealth: Payer: Self-pay | Admitting: Cardiovascular Disease

## 2019-04-30 NOTE — Telephone Encounter (Signed)
Patient is requesting for all of his medications prescribed by Dr. Sallyanne Kuster be 90 day refills instead of 30. He does not need any refills right now.

## 2019-05-04 ENCOUNTER — Other Ambulatory Visit: Payer: Self-pay | Admitting: Internal Medicine

## 2019-05-04 NOTE — Telephone Encounter (Signed)
Done erx 

## 2019-05-05 ENCOUNTER — Other Ambulatory Visit: Payer: Self-pay | Admitting: Cardiovascular Disease

## 2019-05-16 ENCOUNTER — Ambulatory Visit: Payer: Medicare Other | Admitting: Cardiovascular Disease

## 2019-06-20 ENCOUNTER — Other Ambulatory Visit: Payer: Self-pay | Admitting: Cardiovascular Disease

## 2019-07-02 ENCOUNTER — Other Ambulatory Visit: Payer: Self-pay | Admitting: Internal Medicine

## 2019-07-30 ENCOUNTER — Other Ambulatory Visit: Payer: Self-pay | Admitting: Internal Medicine

## 2019-07-30 NOTE — Telephone Encounter (Signed)
Done erx 

## 2019-07-31 DIAGNOSIS — L578 Other skin changes due to chronic exposure to nonionizing radiation: Secondary | ICD-10-CM | POA: Diagnosis not present

## 2019-07-31 DIAGNOSIS — L570 Actinic keratosis: Secondary | ICD-10-CM | POA: Diagnosis not present

## 2019-07-31 DIAGNOSIS — R233 Spontaneous ecchymoses: Secondary | ICD-10-CM | POA: Diagnosis not present

## 2019-07-31 DIAGNOSIS — L814 Other melanin hyperpigmentation: Secondary | ICD-10-CM | POA: Diagnosis not present

## 2019-08-29 ENCOUNTER — Other Ambulatory Visit: Payer: Self-pay | Admitting: Internal Medicine

## 2019-10-03 ENCOUNTER — Other Ambulatory Visit: Payer: Self-pay | Admitting: *Deleted

## 2019-10-03 MED ORDER — AMLODIPINE BESYLATE 5 MG PO TABS: 5.0000 mg | ORAL_TABLET | Freq: Every day | ORAL | 3 refills | Status: DC

## 2019-10-03 NOTE — Telephone Encounter (Signed)
Rx has been sent to the pharmacy electronically. ° °

## 2019-10-04 ENCOUNTER — Other Ambulatory Visit: Payer: Self-pay | Admitting: Cardiovascular Disease

## 2019-10-04 NOTE — Telephone Encounter (Signed)
*  STAT* If patient is at the pharmacy, call can be transferred to refill team.   1. Which medications need to be refilled? (please list name of each medication and dose if known)  Amlodipine and Ramapril  2. Which pharmacy/location (including street and city if local pharmacy) is medication to be sent to [facility]  3. Do they need a 30 day or 90 day supply? 90 days and refills

## 2019-10-05 NOTE — Telephone Encounter (Signed)
Refills sent to pharmacy. 

## 2019-10-17 ENCOUNTER — Other Ambulatory Visit: Payer: Self-pay | Admitting: Internal Medicine

## 2019-10-30 ENCOUNTER — Other Ambulatory Visit: Payer: Self-pay

## 2019-10-30 ENCOUNTER — Encounter: Payer: Self-pay | Admitting: Internal Medicine

## 2019-10-30 ENCOUNTER — Ambulatory Visit (INDEPENDENT_AMBULATORY_CARE_PROVIDER_SITE_OTHER): Payer: Medicare Other | Admitting: Internal Medicine

## 2019-10-30 VITALS — BP 150/70 | HR 67 | Temp 98.8°F | Ht 70.0 in | Wt 163.0 lb

## 2019-10-30 DIAGNOSIS — K649 Unspecified hemorrhoids: Secondary | ICD-10-CM | POA: Diagnosis not present

## 2019-10-30 DIAGNOSIS — N401 Enlarged prostate with lower urinary tract symptoms: Secondary | ICD-10-CM | POA: Diagnosis not present

## 2019-10-30 DIAGNOSIS — R351 Nocturia: Secondary | ICD-10-CM

## 2019-10-30 DIAGNOSIS — I10 Essential (primary) hypertension: Secondary | ICD-10-CM | POA: Diagnosis not present

## 2019-10-30 DIAGNOSIS — E119 Type 2 diabetes mellitus without complications: Secondary | ICD-10-CM

## 2019-10-30 MED ORDER — TAMSULOSIN HCL 0.4 MG PO CAPS: 0.4000 mg | ORAL_CAPSULE | Freq: Every day | ORAL | 3 refills | Status: DC

## 2019-10-30 MED ORDER — HYDROCORTISONE ACETATE 25 MG RE SUPP: 25.0000 mg | Freq: Two times a day (BID) | RECTAL | 1 refills | Status: AC

## 2019-10-30 MED ORDER — RAMIPRIL 5 MG PO CAPS: 5.0000 mg | ORAL_CAPSULE | Freq: Every day | ORAL | 3 refills | Status: DC

## 2019-10-30 NOTE — Patient Instructions (Addendum)
Please take all new medication as prescribed - the generic flomax for the prostate, and the suppositories  Please continue all other medications as before, except ok to increase the ramipril to 5 mg per day  Please have the pharmacy call with any other refills you may need.  Please continue your efforts at being more active, low cholesterol diet, and weight control.  Please keep your appointments with your specialists as you may have planned  Please make an Appointment to return in 6 months, or sooner if needed

## 2019-10-30 NOTE — Progress Notes (Signed)
Subjective:    Patient ID: Phillip [name], male    DOB: [birth year], 74 y.o.   MRN: [id number]  HPI  Here to f/u; overall doing ok,  Pt denies chest pain, increasing sob or doe, wheezing, orthopnea, PND, increased LE swelling, palpitations, dizziness or syncope.  Pt denies new neurological symptoms such as new headache, or facial or extremity weakness or numbness.  Pt denies polydipsia, polyuria, or low sugar episode.  Pt states overall good compliance with meds, mostly trying to follow appropriate diet, with wt overall stable,  but little exercise however.  Also with worsening slower urinary stream with some retention, but Denies urinary symptoms such as dysuria, frequency, urgency, flank pain, hematuria or n/v, fever, chills.  Also has an anal painful lump with slight bleeding in the past wk, asks for hemorrhoid suppository.   Past Medical History:  Diagnosis Date  . Anemia   . Anxiety   . Arthritis   . Bipolar depression ([place])   . CAD, multiple vessel   . Chronic pain disorder    chronic bilat shoulder pain and feet pain  . Depression   . Erectile dysfunction 01/17/2013  . GERD (gastroesophageal reflux disease)   . History of colon polyps   . Hyperlipidemia   . Hypertension   . Insomnia   . Iron deficiency anemia 01/16/2013  . Mixed hyperlipidemia 03/08/2013  . Posterior neck pain 01/22/2019  . Status post insertion of drug-eluting stent into left anterior descending (LAD) artery for coronary artery disease   . Stented coronary artery 01/16/2013   X 2  . Systolic murmur   . Unspecified vitamin D deficiency 01/17/2013   Past Surgical History:  Procedure Laterality Date  . CAROTID STENT  2008  . CORONARY ANGIOPLASTY WITH STENT PLACEMENT  about 2014   current Cardiology: Dr Orene Desanctis  . EYE SURGERY      reports that he quit smoking about 17 years ago. His smoking use included cigarettes. He has a 20.00 pack-year smoking history. His smokeless tobacco use includes chew. He reports  current alcohol use. He reports that he does not use drugs. family history includes Alzheimer's disease in his brother; Cancer in his maternal grandfather, maternal grandmother, and mother; Heart disease in his maternal grandmother, mother, and paternal grandfather; Hyperlipidemia in his maternal grandmother; Lung cancer in his father; Stroke in his maternal grandmother and paternal grandfather. Allergies  Allergen Reactions  . Lipitor [Atorvastatin] Other (See Comments)    myalgia   Current Outpatient Medications on File Prior to Visit  Medication Sig Dispense Refill  . amLODipine (NORVASC) 5 MG tablet Take 1 tablet (5 mg total) by mouth daily. 90 tablet 3  . aspirin 81 MG tablet Take 81 mg by mouth daily.    . carbamazepine (TEGRETOL) 200 MG tablet TAKE 1 TABLET BY MOUTH TWICE DAILY 180 tablet 2  . carvedilol (COREG) 12.5 MG tablet TAKE 1 TABLET BY MOUTH TWICE DAILY WITH A MEAL 90 tablet 3  . clobetasol cream (TEMOVATE) 0.05 % APPLY TOPICALLY TWICE DAILY AS NEEDED 60 g 1  . Cyanocobalamin (VITAMIN B 12 PO) Take by mouth.    . ferrous sulfate 325 (65 FE) MG EC tablet Take 1 tablet (325 mg total) by mouth 2 (two) times a day. 60 tablet 5  . folic acid (FOLVITE) 1 MG tablet TAKE 1 TABLET BY MOUTH DAILY 90 tablet 0  . HYDROcodone-acetaminophen (NORCO/VICODIN) 5-325 MG tablet Take 1 tablet by mouth every 6 (six) hours as needed for moderate  pain. 60 tablet 0  . hydrOXYzine (ATARAX/VISTARIL) 25 MG tablet TAKE 1 TABLET BY MOUTH 3 TIMES DAILY AS NEEDED 270 tablet 1  . leflunomide (ARAVA) 20 MG tablet Take 10 mg by mouth 2 (two) times daily.     . nitroGLYCERIN (NITROSTAT) 0.4 MG SL tablet Place 1 tablet (0.4 mg total) under the tongue every 5 (five) minutes as needed for chest pain. 25 tablet 3  . Omega-3 Fatty Acids (FISH OIL PO) Take by mouth daily.    . pantoprazole (PROTONIX) 40 MG tablet TAKE 1 TABLET BY MOUTH TWICE DAILY 180 tablet 3  . predniSONE (DELTASONE) 10 MG tablet TAKE 1 TABLET BY  MOUTH DAILY 90 tablet 1  . sildenafil (REVATIO) 20 MG tablet Take 3 pills daily as needed 60 tablet 2  . traZODone (DESYREL) 50 MG tablet TAKE 1/2-1 TABLET BY MOUTH AT BEDTIME FOR SLEEP 90 tablet 1  . triamcinolone (NASACORT AQ) 55 MCG/ACT AERO nasal inhaler Place 2 sprays into the nose daily. 1 Inhaler 12   No current facility-administered medications on file prior to visit.   Review of Systems All otherwise neg per pt    Objective:   Physical Exam BP (!) 150/70 (BP Location: Left Arm, Patient Position: Sitting, Cuff Size: Large)   Pulse 67   Temp 98.8 F (37.1 C) (Oral)   Ht 5\' 10"  (1.778 m)   Wt 163 lb (73.9 kg)   SpO2 95%   BMI 23.39 kg/m  VS noted,  Constitutional: Pt appears in NAD HENT: Head: NCAT.  Right Ear: External ear normal.  Left Ear: External ear normal.  Eyes: . Pupils are equal, round, and reactive to light. Conjunctivae and EOM are normal Nose: without d/c or deformity Neck: Neck supple. Gross normal ROM Cardiovascular: Normal rate and regular rhythm.   Pulmonary/Chest: Effort normal and breath sounds without rales or wheezing.  Abd:  Soft, NT, ND, + BS, no organomegaly Neurological: Pt is alert. At baseline orientation, motor grossly intact Skin: Skin is warm. No rashes, other new lesions, no LE edema Psychiatric: Pt behavior is normal without agitation  All otherwise neg per pt Lab Results  Component Value Date   WBC 6.7 01/22/2019   HGB 12.6 (L) 01/22/2019   HCT 38.0 (L) 01/22/2019   PLT 310.0 01/22/2019   GLUCOSE 177 (H) 01/22/2019   CHOL 176 01/22/2019   TRIG 98.0 01/22/2019   HDL 77.60 01/22/2019   LDLDIRECT 142.5 02/27/2014   LDLCALC 79 01/22/2019   ALT 24 01/22/2019   AST 21 01/22/2019   NA 136 01/22/2019   K 4.1 01/22/2019   CL 98 01/22/2019   CREATININE 0.66 01/22/2019   BUN 8 01/22/2019   CO2 29 01/22/2019   TSH 0.96 01/22/2019   PSA 0.73 01/22/2019   INR 1.0 09/15/2008   HGBA1C 7.0 (H) 01/22/2019   MICROALBUR 0.7 01/22/2019       Assessment & Plan:

## 2019-11-01 ENCOUNTER — Other Ambulatory Visit: Payer: Self-pay | Admitting: Cardiovascular Disease

## 2019-11-01 ENCOUNTER — Other Ambulatory Visit: Payer: Self-pay | Admitting: Internal Medicine

## 2019-11-01 DIAGNOSIS — E785 Hyperlipidemia, unspecified: Secondary | ICD-10-CM

## 2019-11-01 NOTE — Telephone Encounter (Signed)
Ok done erx  But please let pt know the plaquinil should come from now on from his rheumatologist as I do not normally prescribe this medication

## 2019-11-02 NOTE — Telephone Encounter (Signed)
Message left for patient

## 2019-11-04 ENCOUNTER — Encounter: Payer: Self-pay | Admitting: Internal Medicine

## 2019-11-04 DIAGNOSIS — N401 Enlarged prostate with lower urinary tract symptoms: Secondary | ICD-10-CM | POA: Insufficient documentation

## 2019-11-04 DIAGNOSIS — K649 Unspecified hemorrhoids: Secondary | ICD-10-CM | POA: Insufficient documentation

## 2019-11-04 NOTE — Assessment & Plan Note (Signed)
Mild to mod, for anusol supp prn,  to f/u any worsening symptoms or concerns

## 2019-11-04 NOTE — Assessment & Plan Note (Signed)
stable overall by history and exam, recent data reviewed with pt, and pt to continue medical treatment as before,  to f/u any worsening symptoms or concerns  

## 2019-11-04 NOTE — Assessment & Plan Note (Signed)
Mild uncontrolled, for increased altace 5 qd

## 2019-11-04 NOTE — Assessment & Plan Note (Addendum)
Ok for flomax asd,  to f/u any worsening symptoms or concerns  I spent 31 minutes in preparing to see the patient by review of recent labs, imaging and procedures, obtaining and reviewing separately obtained history, communicating with the patient and family or caregiver, ordering medications, tests or procedures, and documenting clinical information in the EHR including the differential Dx, treatment, and any further evaluation and other management of bph, htn, hemorrhoid, dm

## 2019-11-23 ENCOUNTER — Ambulatory Visit: Payer: Medicare Other | Admitting: Cardiovascular Disease

## 2019-11-30 ENCOUNTER — Encounter: Payer: Self-pay | Admitting: General Practice

## 2019-11-30 ENCOUNTER — Ambulatory Visit: Payer: Medicare Other | Admitting: General Practice

## 2019-11-30 ENCOUNTER — Other Ambulatory Visit: Payer: Self-pay

## 2019-11-30 ENCOUNTER — Telehealth: Payer: Self-pay | Admitting: Cardiovascular Disease

## 2019-11-30 VITALS — BP 136/56 | HR 67 | Ht 70.0 in | Wt 164.0 lb

## 2019-11-30 DIAGNOSIS — I2510 Atherosclerotic heart disease of native coronary artery without angina pectoris: Secondary | ICD-10-CM

## 2019-11-30 DIAGNOSIS — I10 Essential (primary) hypertension: Secondary | ICD-10-CM | POA: Diagnosis not present

## 2019-11-30 DIAGNOSIS — R0789 Other chest pain: Secondary | ICD-10-CM

## 2019-11-30 NOTE — Telephone Encounter (Signed)
Spoke with pt and pt is complaining  of chest pain this just started today Pt picked up heavy bag yesterday while fishing  and feels like did  Something at that time. Per pt can move a certain way and pail level changes  Pt also notes B/P is creeping up this am was 204/87 prior to meds HR 67 few minutes ago systolic has come down to 182 about 40 minutes after meds Pt also complainng of having h/a Appt made with Coletta Memos NP today at 10:15 am .Adonis Housekeeper

## 2019-11-30 NOTE — Patient Instructions (Signed)
Medication Instructions:  The current medical regimen is effective;  continue present plan and medications as directed. Please refer to the Current Medication list given to you today. *If you need a refill on your cardiac medications before your next appointment, please call your pharmacy*  Lab Work:   Testing/Procedures:  NONE    NONE  Special Instructions TAKE AND LOG YOUR BLOOD PRESSURE DAILY  PLEASE READ AND FOLLOW SALTY 6-ATTACHED  DECREASE CONSUMPTION OF ALCOHOL  PLEASE REST,ICE AND YOU MAY TAKE TYLENOL FOR YOUR PAIN  Follow-Up: Your next appointment:  6 month(s) In Person with Kirk Ruths, MD -Bernard, FNP-C Please call our office 2 months in advance to schedule this appointment   At [facility], you and your health needs are our priority.  As part of our continuing mission to provide you with exceptional heart care, we have created designated Provider Care Teams.  These Care Teams include your primary Cardiologist (physician) and Advanced Practice Providers (APPs -  Physician Assistants and Nurse Practitioners) who all work together to provide you with the care you need, when you need it.  We recommend signing up for the patient portal called "MyChart".  Sign up information is provided on this After Visit Summary.  MyChart is used to connect with patients for Virtual Visits (Telemedicine).  Patients are able to view lab/test results, encounter notes, upcoming appointments, etc.  Non-urgent messages can be sent to your provider as well.   To learn more about what you can do with MyChart, go to [email or web address].

## 2019-11-30 NOTE — Progress Notes (Signed)
Cardiology Clinic Note   Patient Name: Phillip [name] Date of Encounter: 11/30/2019  Primary Care Provider:  Biagio Borg, MD Primary Cardiologist:  Sanda Klein, MD  Patient Profile    Phillip [name] 74 year old male presents to the clinic today for an evaluation of his chest pain and HTN.  Past Medical History    Past Medical History:  Diagnosis Date  . Anemia   . Anxiety   . Arthritis   . Bipolar depression ([place])   . CAD, multiple vessel   . Chronic pain disorder    chronic bilat shoulder pain and feet pain  . Depression   . Erectile dysfunction 01/17/2013  . GERD (gastroesophageal reflux disease)   . History of colon polyps   . Hyperlipidemia   . Hypertension   . Insomnia   . Iron deficiency anemia 01/16/2013  . Mixed hyperlipidemia 03/08/2013  . Posterior neck pain 01/22/2019  . Status post insertion of drug-eluting stent into left anterior descending (LAD) artery for coronary artery disease   . Stented coronary artery 01/16/2013   X 2  . Systolic murmur   . Unspecified vitamin D deficiency 01/17/2013   Past Surgical History:  Procedure Laterality Date  . CAROTID STENT  2008  . CORONARY ANGIOPLASTY WITH STENT PLACEMENT  about 2014   current Cardiology: Dr Orene Desanctis  . EYE SURGERY      Allergies  Allergies  Allergen Reactions  . Lipitor [Atorvastatin] Other (See Comments)    myalgia    History of Present Illness    Mr. Phillip [name] has a PMH of coronary artery disease (DES to LAD in 2011, cardiac catheterization 4/08 was elective cardiac catheterization and was found to have high-grade ramus which was 90% occluded and received stenting).  He was also found to have residual 50% mid LAD, 30-40% RCA, and 30% circumflex.  His EF was 55% his LIMA was noted to be widely patent.  His PMH also includes HTN, HLD, and anemia.  He was seen by Dr. Sallyanne Kuster 7/20 and was doing well at that time.  His clopidogrel was discontinued.  He was counseled on decreasing his EtOH  consumption.  He was last seen by Jory Sims, DNP on 02/13/2019.  During that time he indicated that his blood pressure has been more elevated over the last several days.  It has been running in the 180s-190s.  He continued to drink heavily.  He would begin drinking beer around 2 PM and transition to spirits until he eventually would fall asleep.  He was noted to not be taking his medication as directed with consistency.  He indicated he was sometimes taking his medication in the mornings, at times in the afternoons and at other times in the evening.  He stated that he did not always sleep at home and had several girlfriends which she stayed the night with on multiple occasions and he would not bring his medication.  He remains active playing golf, chopping wood, and socializing with friends.  He indicated he had chest pain/pressure with elevated blood pressure.  He also indicated that he did not keep his medication separate for them all into 1 bottle.  He indicated he recognized them by color but was unsure which medications he was taking.  He contacted nurse triage line today indicating that he was having chest pain and elevated blood pressure.  He presents to the clinic today for evaluation and states he noticed chest discomfort this morning when he woke up.  His  blood pressure was also elevated.  He indicates that he was fishing yesterday and lifted some heavy gear with his right arm.  He has no exertional chest pain but does have chest pain with movement and deep palpation on exam.  He indicates that he continues to consume 6-7 alcoholic beverages per day.  He is not compliant with his medications and is taking them regularly.  Have recommended rest, cool compresses, over-the-counter pain medication, and give him a blood pressure log to continue to monitor his blood pressure.  Today he denies exertional chest pain, shortness of breath, lower extremity edema, fatigue, palpitations, melena,  hematuria, hemoptysis, diaphoresis, weakness, presyncope, syncope, orthopnea, and PND.  Home Medications    Prior to Admission medications   Medication Sig Start Date End Date Taking? Authorizing Provider  ALPRAZolam Duanne Moron) 1 MG tablet TAKE 1 TABLET BY MOUTH TWICE DAILY AS NEEDED 11/01/19  Yes Biagio Borg, MD  amLODipine (NORVASC) 5 MG tablet Take 1 tablet (5 mg total) by mouth daily. 10/03/19  Yes Leonie Man, MD  aspirin 81 MG tablet Take 81 mg by mouth daily.   Yes [provider]  carbamazepine (TEGRETOL) 200 MG tablet TAKE 1 TABLET BY MOUTH TWICE DAILY 10/18/18  Yes Biagio Borg, MD  carvedilol (COREG) 12.5 MG tablet TAKE 1 TABLET BY MOUTH TWICE DAILY WITH A MEAL 06/22/19  Yes Croitoru, Mihai, MD  clobetasol cream (TEMOVATE) 0.05 % APPLY TOPICALLY TWICE DAILY AS NEEDED 03/22/14  Yes Biagio Borg, MD  Cyanocobalamin (VITAMIN B 12 PO) Take by mouth.   Yes [provider]  ferrous sulfate 325 (65 FE) MG EC tablet Take 1 tablet (325 mg total) by mouth 2 (two) times a day. 06/28/18  Yes Biagio Borg, MD  folic acid (FOLVITE) 1 MG tablet TAKE 1 TABLET BY MOUTH DAILY 01/22/14  Yes Biagio Borg, MD  HYDROcodone-acetaminophen (NORCO/VICODIN) 5-325 MG tablet Take 1 tablet by mouth every 6 (six) hours as needed for moderate pain. 11/11/16  Yes Biagio Borg, MD  hydrocortisone (ANUSOL-HC) 25 MG suppository Place 1 suppository (25 mg total) rectally every 12 (twelve) hours. 10/30/19 10/29/20 Yes Biagio Borg, MD  hydroxychloroquine (PLAQUENIL) 200 MG tablet TAKE 2 TABLETS BY MOUTH DAILY 11/01/19  Yes Biagio Borg, MD  hydrOXYzine (ATARAX/VISTARIL) 25 MG tablet TAKE 1 TABLET BY MOUTH 3 TIMES DAILY AS NEEDED 08/29/19  Yes Biagio Borg, MD  leflunomide (ARAVA) 20 MG tablet Take 10 mg by mouth 2 (two) times daily.  11/29/13  Yes [provider]  nitroGLYCERIN (NITROSTAT) 0.4 MG SL tablet Place 1 tablet (0.4 mg total) under the tongue every 5 (five) minutes as needed for chest pain.  06/16/17  Yes Kilroy, Luke K, PA-C  Omega-3 Fatty Acids (FISH OIL PO) Take by mouth daily.   Yes [provider]  pantoprazole (PROTONIX) 40 MG tablet TAKE 1 TABLET BY MOUTH TWICE DAILY 01/23/19  Yes Biagio Borg, MD  predniSONE (DELTASONE) 10 MG tablet TAKE 1 TABLET BY MOUTH DAILY 07/02/19  Yes Biagio Borg, MD  ramipril (ALTACE) 5 MG capsule Take 1 capsule (5 mg total) by mouth daily. 10/30/19  Yes Biagio Borg, MD  rosuvastatin (CRESTOR) 20 MG tablet TAKE 1 TABLET BY MOUTH DAILY 11/01/19  Yes Croitoru, Mihai, MD  sildenafil (REVATIO) 20 MG tablet Take 3 pills daily as needed 01/22/19  Yes Biagio Borg, MD  tamsulosin (FLOMAX) 0.4 MG CAPS capsule Take 1 capsule (0.4 mg total) by  mouth daily. 10/30/19  Yes Biagio Borg, MD  traZODone (DESYREL) 50 MG tablet TAKE 1/2-1 TABLET BY MOUTH AT BEDTIME FOR SLEEP 10/21/19  Yes Plotnikov, Evie Lacks, MD  triamcinolone (NASACORT AQ) 55 MCG/ACT AERO nasal inhaler Place 2 sprays into the nose daily. 06/01/16  Yes Biagio Borg, MD    Family History    Family History  Problem Relation Age of Onset  . Cancer Mother   . Heart disease Mother   . Heart disease Maternal Grandmother   . Stroke Maternal Grandmother   . Hyperlipidemia Maternal Grandmother   . Cancer Maternal Grandmother   . Lung cancer Father   . Alzheimer's disease Brother   . Cancer Maternal Grandfather   . Heart disease Paternal Grandfather   . Stroke Paternal Grandfather    He indicated that his mother is deceased. He indicated that his father is deceased. He indicated that his brother is alive. He indicated that his maternal grandmother is deceased. He indicated that his maternal grandfather is deceased. He indicated that his paternal grandmother is deceased. He indicated that his paternal grandfather is deceased.  Social History    Social History   Socioeconomic History  . Marital status: Single    Spouse name: Not on file  . Number of children: Not on file  . Years of  education: Not on file  . Highest education level: Not on file  Occupational History  . Not on file  Tobacco Use  . Smoking status: Former Smoker    Packs/day: 1.00    Years: 20.00    Pack years: 20.00    Types: Cigarettes    Quit date: 02/22/2002    Years since quitting: 17.7  . Smokeless tobacco: Current User    Types: Chew  Substance and Sexual Activity  . Alcohol use: Yes  . Drug use: No  . Sexual activity: Not on file  Other Topics Concern  . Not on file  Social History Narrative  . Not on file   Social Determinants of Health   Financial Resource Strain:   . Difficulty of Paying Living Expenses: Not on file  Food Insecurity:   . Worried About Charity fundraiser in the Last Year: Not on file  . Ran Out of Food in the Last Year: Not on file  Transportation Needs:   . Lack of Transportation (Medical): Not on file  . Lack of Transportation (Non-Medical): Not on file  Physical Activity:   . Days of Exercise per Week: Not on file  . Minutes of Exercise per Session: Not on file  Stress:   . Feeling of Stress : Not on file  Social Connections:   . Frequency of Communication with Friends and Family: Not on file  . Frequency of Social Gatherings with Friends and Family: Not on file  . Attends Religious Services: Not on file  . Active Member of Clubs or Organizations: Not on file  . Attends Archivist Meetings: Not on file  . Marital Status: Not on file  Intimate Partner Violence:   . Fear of Current or Ex-Partner: Not on file  . Emotionally Abused: Not on file  . Physically Abused: Not on file  . Sexually Abused: Not on file     Review of Systems    General:  No chills, fever, night sweats or weight changes.  Cardiovascular:  No chest pain, dyspnea on exertion, edema, orthopnea, palpitations, paroxysmal nocturnal dyspnea. Dermatological: No rash, lesions/masses Respiratory: No cough, dyspnea  Urologic: No hematuria, dysuria Abdominal:   No nausea,  vomiting, diarrhea, bright red blood per rectum, melena, or hematemesis Neurologic:  No visual changes, wkns, changes in mental status. All other systems reviewed and are otherwise negative except as noted above.  Physical Exam    VS:  BP (!) 136/56   Pulse 67   Ht 5\' 10"  (1.778 m)   Wt 164 lb (74.4 kg)   SpO2 96%   BMI 23.53 kg/m  , BMI Body mass index is 23.53 kg/m. GEN: Well nourished, well developed, in no acute distress. HEENT: normal. Neck: Supple, no JVD, carotid bruits, or masses. Cardiac: RRR, no murmurs, rubs, or gallops. No clubbing, cyanosis, edema.  Radials/DP/PT 2+ and equal bilaterally.  Respiratory:  Respirations regular and unlabored, clear to auscultation bilaterally. GI: Soft, nontender, nondistended, BS + x 4. MS: no deformity or atrophy. Skin: warm and dry, no rash. Neuro:  Strength and sensation are intact. Psych: Normal affect.  Accessory Clinical Findings    Recent Labs: 01/22/2019: ALT 24; BUN 8; Creatinine, Ser 0.66; Hemoglobin 12.6; Platelets 310.0; Potassium 4.1; Sodium 136; TSH 0.96   Recent Lipid Panel    Component Value Date/Time   CHOL 176 01/22/2019 1544   TRIG 98.0 01/22/2019 1544   HDL 77.60 01/22/2019 1544   CHOLHDL 2 01/22/2019 1544   VLDL 19.6 01/22/2019 1544   LDLCALC 79 01/22/2019 1544   LDLDIRECT 142.5 02/27/2014 1013    ECG personally reviewed by me today-normal sinus rhythm 68 bpm no ST or T wave deviation- No acute changes  Echocardiogram 08/14/2018 IMPRESSIONS    1. The left ventricle has normal systolic function with an ejection  fraction of 60-65%. The cavity size was normal. There is moderately  increased left ventricular wall thickness. Left ventricular diastolic  parameters were normal.  2. The right ventricle has normal systolic function. The cavity was  normal. There is no increase in right ventricular wall thickness.  3. The tricuspid valve is grossly normal.  4. The aortic valve is tricuspid. Mild  thickening of the aortic valve.  Mild calcification of the aortic valve.  Assessment & Plan   1.  Atypical chest pain-appears to be MSK in nature.  Reproducible with deep palpation on exam.  EKG today shows Rest, cool compresses, may use Tylenol for pain Heart healthy low-sodium diet-salty 6 given Increase physical activity as tolerated Continue to monitor  Essential hypertension-BP today 136/56 .  Patient taking medications regularly and compliant.  Right Continue amlodipine, carvedilol, ramipril Heart healthy low-sodium diet-salty 6 given Increase physical activity as tolerated  Coronary artery disease-previous stenting to LAD and ramus.  No exertional chest pain. Continue amlodipine, carvedilol, rosuvastatin Heart healthy low-sodium diet-salty 6 given Increase physical activity as tolerated   Disposition: Follow-up with Dr. Sallyanne Kuster in 6  months.  Jossie Ng. Kruze Atchley NP-C    11/30/2019, 10:52 AM [place] [address] Office [phone] Fax [phone]  Notice: This dictation was prepared with Dragon dictation along with smaller phrase technology. Any transcriptional errors that result from this process are unintentional and may not be corrected upon review.

## 2019-11-30 NOTE — Telephone Encounter (Signed)
Pt c/o of Chest Pain: STAT if CP now or developed within 24 hours  1. Are you having CP right now? Tightness in his chest right now  2. Are you experiencing any other symptoms (ex. SOB, nausea, vomiting, sweating)?   3. How long have you been experiencing CP?  4. Is your CP continuous or coming and going?  5. Have you taken Nitroglycerin?

## 2019-12-05 ENCOUNTER — Telehealth: Payer: Self-pay | Admitting: Internal Medicine

## 2019-12-05 DIAGNOSIS — I10 Essential (primary) hypertension: Secondary | ICD-10-CM | POA: Diagnosis not present

## 2019-12-05 NOTE — Telephone Encounter (Signed)
[facility] nurse called and stated the patient has been triaged for high BP and needs to be seen in 4 hours. His BP was 198/87. Patient states now it is 148/72.  We have no open appointment with in 4 hours. Patient has an [place] for 10/15 with Dr.John.  Please follow up with patient and inform what he needs to do. Thank you.

## 2019-12-05 NOTE — Telephone Encounter (Signed)
I certainly would not go against a telehealth nurse BP rules  Sounds like he should go to [facility], since if I say different I may be opening up myself to legal problems

## 2019-12-05 NOTE — Telephone Encounter (Signed)
Sent to Dr. John to advise. 

## 2019-12-07 ENCOUNTER — Encounter: Payer: Self-pay | Admitting: Internal Medicine

## 2019-12-07 ENCOUNTER — Ambulatory Visit (INDEPENDENT_AMBULATORY_CARE_PROVIDER_SITE_OTHER): Payer: Medicare Other | Admitting: Internal Medicine

## 2019-12-07 ENCOUNTER — Other Ambulatory Visit: Payer: Self-pay

## 2019-12-07 VITALS — BP 142/70 | HR 70 | Temp 98.1°F | Ht 70.0 in | Wt 165.0 lb

## 2019-12-07 DIAGNOSIS — F339 Major depressive disorder, recurrent, unspecified: Secondary | ICD-10-CM

## 2019-12-07 DIAGNOSIS — I10 Essential (primary) hypertension: Secondary | ICD-10-CM | POA: Diagnosis not present

## 2019-12-07 DIAGNOSIS — F1010 Alcohol abuse, uncomplicated: Secondary | ICD-10-CM | POA: Diagnosis not present

## 2019-12-07 DIAGNOSIS — E119 Type 2 diabetes mellitus without complications: Secondary | ICD-10-CM

## 2019-12-07 NOTE — Patient Instructions (Signed)
Please continue all other medications as before, including the amlodipine at 10 mg per day  Please continue to cut back or stop the alcohol, as this will raise the Blood Pressure as well  Please have the pharmacy call with any other refills you may need.  Please continue your efforts at being more active, low cholesterol diet, and weight control.  Please keep your appointments with your specialists as you may have planned

## 2019-12-07 NOTE — Telephone Encounter (Signed)
I spoke with pt x2 days ago pt stated he understood and was going to the [facility] for his BP.  **Spoke with pt to day and he has stated he will still come in to see Dr. Jenny Reichmann for his appointment today.

## 2019-12-07 NOTE — Progress Notes (Signed)
Subjective:    Patient ID: Phillip [name], male    DOB: May 26, 1945, 74 y.o.   MRN: [id number]  HPI  Here to f/u; overall doing ok,  Pt denies chest pain, increasing sob or doe, wheezing, orthopnea, PND, increased LE swelling, palpitations, dizziness or syncope.  Pt denies new neurological symptoms such as new headache, or facial or extremity weakness or numbness.  Pt denies polydipsia, polyuria, or low sugar episode.  Pt states overall good compliance with meds, mostly trying to follow appropriate diet,  bp has been up to 200 recently quite often, [facility] and am[facility]ine increased to 10 mg, and has since also reduced his etoh intake much less than the > 6 beer/day he has been doing for over 6 months.  Denies worsening depressive symptoms, suicidal ideation, or panic; has ongoing anxiety BP Readings from Last 3 Encounters:  12/07/19 (!) 142/70  11/30/19 (!) 136/56  10/30/19 (!) 150/70   Past Medical History:  Diagnosis Date  . Anemia   . Anxiety   . Arthritis   . Bipolar depression ([place])   . CAD, multiple vessel   . Chronic pain disorder    chronic bilat shoulder pain and feet pain  . Depression   . Erectile dysfunction 01/17/2013  . GERD (gastroesophageal reflux disease)   . History of colon polyps   . Hyperlipidemia   . Hypertension   . Insomnia   . Iron deficiency anemia 01/16/2013  . Mixed hyperlipidemia 03/08/2013  . Posterior neck pain 01/22/2019  . Status post insertion of drug-eluting stent into left anterior descending (LAD) artery for coronary artery disease   . Stented coronary artery 01/16/2013   X 2  . Systolic murmur   . Unspecified vitamin D deficiency 01/17/2013   Past Surgical History:  Procedure Laterality Date  . CAROTID STENT  2008  . CORONARY ANGIOPLASTY WITH STENT PLACEMENT  about 2014   current Cardiology: Dr Orene Desanctis  . EYE SURGERY      reports that he quit smoking about 17 years ago. His smoking use included cigarettes. He has a 20.00 pack-year  smoking history. His smokeless tobacco use includes chew. He reports current alcohol use. He reports that he does not use drugs. family history includes Alzheimer's disease in his brother; Cancer in his maternal grandfather, maternal grandmother, and mother; Heart disease in his maternal grandmother, mother, and paternal grandfather; Hyperlipidemia in his maternal grandmother; Lung cancer in his father; Stroke in his maternal grandmother and paternal grandfather. Allergies  Allergen Reactions  . Lipitor [Atorvastatin] Other (See Comments)    myalgia   Current Outpatient Medications on File Prior to Visit  Medication Sig Dispense Refill  . ALPRAZolam (XANAX) 1 MG tablet TAKE 1 TABLET BY MOUTH TWICE DAILY AS NEEDED 60 tablet 2  . amLODipine (NORVASC) 5 MG tablet Take 1 tablet (5 mg total) by mouth daily. (Patient taking differently: Take 5 mg by mouth daily. 2pills) 90 tablet 3  . aspirin 81 MG tablet Take 81 mg by mouth daily.    . carbamazepine (TEGRETOL) 200 MG tablet TAKE 1 TABLET BY MOUTH TWICE DAILY 180 tablet 2  . carvedilol (COREG) 12.5 MG tablet TAKE 1 TABLET BY MOUTH TWICE DAILY WITH A MEAL 90 tablet 3  . clobetasol cream (TEMOVATE) 0.05 % APPLY TOPICALLY TWICE DAILY AS NEEDED 60 g 1  . Cyanocobalamin (VITAMIN B 12 PO) Take by mouth.    . ferrous sulfate 325 (65 FE) MG EC tablet Take 1 tablet (325  mg total) by mouth 2 (two) times a day. 60 tablet 5  . folic acid (FOLVITE) 1 MG tablet TAKE 1 TABLET BY MOUTH DAILY 90 tablet 0  . HYDROcodone-acetaminophen (NORCO/VICODIN) 5-325 MG tablet Take 1 tablet by mouth every 6 (six) hours as needed for moderate pain. 60 tablet 0  . hydrocortisone (ANUSOL-HC) 25 MG suppository Place 1 suppository (25 mg total) rectally every 12 (twelve) hours. 12 suppository 1  . hydroxychloroquine (PLAQUENIL) 200 MG tablet TAKE 2 TABLETS BY MOUTH DAILY 60 tablet 1  . hydrOXYzine (ATARAX/VISTARIL) 25 MG tablet TAKE 1 TABLET BY MOUTH 3 TIMES DAILY AS NEEDED 270 tablet  1  . leflunomide (ARAVA) 20 MG tablet Take 10 mg by mouth 2 (two) times daily.     . nitroGLYCERIN (NITROSTAT) 0.4 MG SL tablet Place 1 tablet (0.4 mg total) under the tongue every 5 (five) minutes as needed for chest pain. 25 tablet 3  . Omega-3 Fatty Acids (FISH OIL PO) Take by mouth daily.    . pantoprazole (PROTONIX) 40 MG tablet TAKE 1 TABLET BY MOUTH TWICE DAILY 180 tablet 3  . predniSONE (DELTASONE) 10 MG tablet TAKE 1 TABLET BY MOUTH DAILY 90 tablet 1  . ramipril (ALTACE) 5 MG capsule Take 1 capsule (5 mg total) by mouth daily. 90 capsule 3  . rosuvastatin (CRESTOR) 20 MG tablet TAKE 1 TABLET BY MOUTH DAILY 90 tablet 2  . sildenafil (REVATIO) 20 MG tablet Take 3 pills daily as needed 60 tablet 2  . tamsulosin (FLOMAX) 0.4 MG CAPS capsule Take 1 capsule (0.4 mg total) by mouth daily. 90 capsule 3  . traZODone (DESYREL) 50 MG tablet TAKE 1/2-1 TABLET BY MOUTH AT BEDTIME FOR SLEEP 90 tablet 1  . triamcinolone (NASACORT AQ) 55 MCG/ACT AERO nasal inhaler Place 2 sprays into the nose daily. 1 Inhaler 12   No current facility-administered medications on file prior to visit.   Review of Systems All otherwise neg per pt    Objective:   Physical Exam BP (!) 142/70 (BP Location: Left Arm, Patient Position: Sitting, Cuff Size: Large)   Pulse 70   Temp 98.1 F (36.7 C) (Oral)   Ht 5\' 10"  (1.778 m)   Wt 165 lb (74.8 kg)   SpO2 95%   BMI 23.68 kg/m  VS noted,  Constitutional: Pt appears in NAD HENT: Head: NCAT.  Right Ear: External ear normal.  Left Ear: External ear normal.  Eyes: . Pupils are equal, round, and reactive to light. Conjunctivae and EOM are normal Nose: without d/c or deformity Neck: Neck supple. Gross normal ROM Cardiovascular: Normal rate and regular rhythm.   Pulmonary/Chest: Effort normal and breath sounds without rales or wheezing.  Abd:  Soft, NT, ND, + BS, no organomegaly Neurological: Pt is alert. At baseline orientation, motor grossly intact Skin: Skin is  warm. No rashes, other new lesions, no LE edema Psychiatric: Pt behavior is normal without agitation  All otherwise neg per pt Lab Results  Component Value Date   WBC 6.7 01/22/2019   HGB 12.6 (L) 01/22/2019   HCT 38.0 (L) 01/22/2019   PLT 310.0 01/22/2019   GLUCOSE 177 (H) 01/22/2019   CHOL 176 01/22/2019   TRIG 98.0 01/22/2019   HDL 77.60 01/22/2019   LDLDIRECT 142.5 02/27/2014   LDLCALC 79 01/22/2019   ALT 24 01/22/2019   AST 21 01/22/2019   NA 136 01/22/2019   K 4.1 01/22/2019   CL 98 01/22/2019   CREATININE 0.66 01/22/2019   BUN  8 01/22/2019   CO2 29 01/22/2019   TSH 0.96 01/22/2019   PSA 0.73 01/22/2019   INR 1.0 09/15/2008   HGBA1C 7.0 (H) 01/22/2019   MICROALBUR 0.7 01/22/2019      Assessment & Plan:

## 2019-12-09 ENCOUNTER — Encounter: Payer: Self-pay | Admitting: Internal Medicine

## 2019-12-09 DIAGNOSIS — F1010 Alcohol abuse, uncomplicated: Secondary | ICD-10-CM | POA: Insufficient documentation

## 2019-12-09 NOTE — Assessment & Plan Note (Signed)
Counseled to abstain

## 2019-12-09 NOTE — Assessment & Plan Note (Signed)
stable overall by history and exam, recent data reviewed with pt, and pt to continue medical treatment as before,  to f/u any worsening symptoms or concerns  

## 2019-12-09 NOTE — Assessment & Plan Note (Addendum)
Improved, ok to continue the amlodipine 10 qd and except a bit more improveement as has only been 3 days on this, cont less etoh as well  I spent 31 minutes in preparing to see the patient by review of recent labs, imaging and procedures, obtaining and reviewing separately obtained history, communicating with the patient and family or caregiver, ordering medications, tests or procedures, and documenting clinical information in the EHR including the differential Dx, treatment, and any further evaluation and other management of htn, dm, depression, alcohol abuse

## 2019-12-19 ENCOUNTER — Other Ambulatory Visit: Payer: Self-pay | Admitting: Internal Medicine

## 2020-01-02 ENCOUNTER — Other Ambulatory Visit: Payer: Self-pay | Admitting: Internal Medicine

## 2020-01-08 ENCOUNTER — Other Ambulatory Visit: Payer: Self-pay | Admitting: Internal Medicine

## 2020-01-23 ENCOUNTER — Other Ambulatory Visit: Payer: Self-pay | Admitting: Internal Medicine

## 2020-02-05 ENCOUNTER — Other Ambulatory Visit: Payer: Self-pay | Admitting: Cardiovascular Disease

## 2020-02-05 ENCOUNTER — Other Ambulatory Visit: Payer: Self-pay | Admitting: Internal Medicine

## 2020-02-05 NOTE — Telephone Encounter (Signed)
Please refill as per office routine med refill policy (all routine meds refilled for 3 mo or monthly per pt preference up to one year from last visit, then month to month grace period for 3 mo, then further med refills will have to be denied)  

## 2020-02-11 ENCOUNTER — Telehealth: Payer: Self-pay | Admitting: Cardiovascular Disease

## 2020-02-11 ENCOUNTER — Telehealth: Payer: Self-pay | Admitting: Internal Medicine

## 2020-02-11 MED ORDER — AMLODIPINE BESYLATE 5 MG PO TABS: 5.0000 mg | ORAL_TABLET | Freq: Two times a day (BID) | ORAL | 3 refills | Status: DC

## 2020-02-11 NOTE — Telephone Encounter (Signed)
amLODipine (NORVASC) 5 MG tablet Patient wondering if he still needs to take this medication, when he went to the hospital they put him back on the medication and he has been taking two 5 mg tablets and when he went to the pharmacy they informed him he didn't need the medication so he was trying to get some clarification.

## 2020-02-11 NOTE — Telephone Encounter (Signed)
Sent to Dr. John. 

## 2020-02-11 NOTE — Telephone Encounter (Signed)
Patient states when he went to urgent care he was advised to double his Amlodipine to 1 tablet to 2x daily. He states due to this increase, he will be out of medication soon. He states if Dr. Sallyanne Kuster advises that he continue taking the medication twice daily, he will need a new presciption sent to his local pharmacy, listed below. Are we able to assist? Please advise.   *STAT* If patient is at the pharmacy, call can be transferred to refill team.   1. Which medications need to be refilled? (please list name of each medication and dose if known) amLODipine (NORVASC) 5 MG tablet  2. Which pharmacy/location (including street and city if local pharmacy) is medication to be sent to? [facility] [place] [address].  3. Do they need a 30 day or 90 day supply? 90 day supply

## 2020-02-11 NOTE — Telephone Encounter (Signed)
Yes, I would believe your heart doctor over the pharmacist who does not prescribe medication or treat patients  Please continue the amlodipine 5 mg twice per day

## 2020-02-12 ENCOUNTER — Encounter: Payer: Self-pay | Admitting: Internal Medicine

## 2020-02-12 NOTE — Telephone Encounter (Signed)
LDVM for of Dr. Jenny Reichmann instructions to please continue to take the Amlodipine at 5mg  twice a day.

## 2020-02-14 LAB — IFOBT (OCCULT BLOOD): IFOBT: POSITIVE

## 2020-02-28 ENCOUNTER — Telehealth: Payer: Self-pay | Admitting: Internal Medicine

## 2020-02-28 DIAGNOSIS — R195 Other fecal abnormalities: Secondary | ICD-10-CM

## 2020-02-28 NOTE — Telephone Encounter (Signed)
Ok to let pt know,  We received a POSITIVE test for fecal occult blood from his BIO-Q testing  I have taken the liberty to refer to Gastroenterology in case any further evaluation needs to be done

## 2020-03-03 ENCOUNTER — Encounter: Payer: Self-pay | Admitting: Internal Medicine

## 2020-03-03 ENCOUNTER — Ambulatory Visit (INDEPENDENT_AMBULATORY_CARE_PROVIDER_SITE_OTHER): Payer: Medicare Other | Admitting: Internal Medicine

## 2020-03-03 ENCOUNTER — Other Ambulatory Visit: Payer: Self-pay

## 2020-03-03 VITALS — BP 138/70 | HR 57 | Temp 98.0°F | Ht 70.0 in | Wt 167.6 lb

## 2020-03-03 DIAGNOSIS — Z23 Encounter for immunization: Secondary | ICD-10-CM | POA: Diagnosis not present

## 2020-03-03 DIAGNOSIS — M069 Rheumatoid arthritis, unspecified: Secondary | ICD-10-CM | POA: Diagnosis not present

## 2020-03-03 DIAGNOSIS — E119 Type 2 diabetes mellitus without complications: Secondary | ICD-10-CM | POA: Diagnosis not present

## 2020-03-03 DIAGNOSIS — S60459A Superficial foreign body of unspecified finger, initial encounter: Secondary | ICD-10-CM

## 2020-03-03 DIAGNOSIS — E538 Deficiency of other specified B group vitamins: Secondary | ICD-10-CM | POA: Diagnosis not present

## 2020-03-03 DIAGNOSIS — M25542 Pain in joints of left hand: Secondary | ICD-10-CM

## 2020-03-03 DIAGNOSIS — I10 Essential (primary) hypertension: Secondary | ICD-10-CM

## 2020-03-03 DIAGNOSIS — E782 Mixed hyperlipidemia: Secondary | ICD-10-CM

## 2020-03-03 DIAGNOSIS — Z0001 Encounter for general adult medical examination with abnormal findings: Secondary | ICD-10-CM

## 2020-03-03 DIAGNOSIS — M25541 Pain in joints of right hand: Secondary | ICD-10-CM | POA: Insufficient documentation

## 2020-03-03 DIAGNOSIS — Z0000 Encounter for general adult medical examination without abnormal findings: Secondary | ICD-10-CM

## 2020-03-03 DIAGNOSIS — M79671 Pain in right foot: Secondary | ICD-10-CM

## 2020-03-03 DIAGNOSIS — R195 Other fecal abnormalities: Secondary | ICD-10-CM | POA: Insufficient documentation

## 2020-03-03 DIAGNOSIS — E559 Vitamin D deficiency, unspecified: Secondary | ICD-10-CM

## 2020-03-03 DIAGNOSIS — D508 Other iron deficiency anemias: Secondary | ICD-10-CM

## 2020-03-03 LAB — HEPATIC FUNCTION PANEL
ALT: 18 U/L (ref 0–53)
AST: 21 U/L (ref 0–37)
Albumin: 4.4 g/dL (ref 3.5–5.2)
Alkaline Phosphatase: 59 U/L (ref 39–117)
Bilirubin, Direct: 0.1 mg/dL (ref 0.0–0.3)
Total Bilirubin: 0.4 mg/dL (ref 0.2–1.2)
Total Protein: 7.1 g/dL (ref 6.0–8.3)

## 2020-03-03 LAB — URINALYSIS, ROUTINE W REFLEX MICROSCOPIC
Bilirubin Urine: NEGATIVE
Hgb urine dipstick: NEGATIVE
Ketones, ur: NEGATIVE
Leukocytes,Ua: NEGATIVE
Nitrite: NEGATIVE
RBC / HPF: NONE SEEN (ref 0–?)
Specific Gravity, Urine: 1.015 (ref 1.000–1.030)
Total Protein, Urine: NEGATIVE
Urine Glucose: >=1000 — AB
Urobilinogen, UA: 0.2 (ref 0.0–1.0)
WBC, UA: NONE SEEN (ref 0–?)
pH: 6 (ref 5.0–8.0)

## 2020-03-03 LAB — LIPID PANEL
Cholesterol: 175 mg/dL (ref 0–200)
HDL: 80.1 mg/dL (ref >39.00)
LDL Cholesterol: 68 mg/dL (ref 0–99)
NonHDL: 95.12
Total CHOL/HDL Ratio: 2
Triglycerides: 134 mg/dL (ref 0.0–149.0)
VLDL: 26.8 mg/dL (ref 0.0–40.0)

## 2020-03-03 LAB — CBC WITH DIFFERENTIAL/PLATELET
Basophils Absolute: 0.1 10*3/uL (ref 0.0–0.1)
Basophils Relative: 0.7 % (ref 0.0–3.0)
Eosinophils Absolute: 0.1 10*3/uL (ref 0.0–0.7)
Eosinophils Relative: 1.2 % (ref 0.0–5.0)
HCT: 39.6 % (ref 39.0–52.0)
Hemoglobin: 13 g/dL (ref 13.0–17.0)
Lymphocytes Relative: 12.4 % (ref 12.0–46.0)
Lymphs Abs: 1.1 10*3/uL (ref 0.7–4.0)
MCHC: 33 g/dL (ref 30.0–36.0)
MCV: 90.9 fl (ref 78.0–100.0)
Monocytes Absolute: 0.6 10*3/uL (ref 0.1–1.0)
Monocytes Relative: 6.5 % (ref 3.0–12.0)
Neutro Abs: 7.2 10*3/uL (ref 1.4–7.7)
Neutrophils Relative %: 79.2 % — ABNORMAL HIGH (ref 43.0–77.0)
Platelets: 284 10*3/uL (ref 150.0–400.0)
RBC: 4.35 Mil/uL (ref 4.22–5.81)
RDW: 13.4 % (ref 11.5–15.5)
WBC: 9.1 10*3/uL (ref 4.0–10.5)

## 2020-03-03 LAB — BASIC METABOLIC PANEL
BUN: 8 mg/dL (ref 6–23)
CO2: 32 mEq/L (ref 19–32)
Calcium: 9.5 mg/dL (ref 8.4–10.5)
Chloride: 97 mEq/L (ref 96–112)
Creatinine, Ser: 0.73 mg/dL (ref 0.40–1.50)
GFR: 89.4 mL/min (ref >60.00)
Glucose, Bld: 255 mg/dL — ABNORMAL HIGH (ref 70–99)
Potassium: 4.6 mEq/L (ref 3.5–5.1)
Sodium: 135 mEq/L (ref 135–145)

## 2020-03-03 LAB — PSA: PSA: 0.75 ng/mL (ref 0.10–4.00)

## 2020-03-03 LAB — VITAMIN B12: Vitamin B-12: 369 pg/mL (ref 211–911)

## 2020-03-03 LAB — VITAMIN D 25 HYDROXY (VIT D DEFICIENCY, FRACTURES): VITD: 52.78 ng/mL (ref 30.00–100.00)

## 2020-03-03 LAB — TSH: TSH: 1.67 u[IU]/mL (ref 0.35–4.50)

## 2020-03-03 LAB — HEMOGLOBIN A1C: Hgb A1c MFr Bld: 8.1 % — ABNORMAL HIGH (ref 4.6–6.5)

## 2020-03-03 NOTE — Progress Notes (Signed)
Established Patient Office Visit  Subjective:  Patient ID: Phillip [name], male    DOB: Oct 11, 1945  Age: 75 y.o. MRN: [id number]  CC:  Chief Complaint  Patient presents with  . Follow-up    Patient wants to discuss getting a referral to GI for colonocopy (positive FOBT 02/12/20) also needs med refills and have his RT middle finger.          HPI:  Phillip [name] is a 75 y.o. male here for wellness exam      Plans to see optho soon. Ok fo rflu shot today.  Had the one shot covid vax but not planning on booster for now.  Wt Readings from Last 3 Encounters:  03/07/20 160 lb (72.6 kg)  03/03/20 167 lb 9.6 oz (76 kg)  12/07/19 165 lb (74.8 kg)   BP Readings from Last 3 Encounters:  03/03/20 138/70  12/07/19 (!) 142/70  11/30/19 (!) 136/56        Also c/o acute problem of: Also with pain to bilat DIPs 4th fingers both hands with pain, swelling and reduced ROM for several months, cant make a fist, and also 3rd finger right hand fingertip pad with a small thorn or other he just cannot remove on his own and continues to irritate with discomfort and slight swelling;  Also need refer to Gi for FOBT+ dec 21, Denies worsening reflux, abd pain, dysphagia, n/v, bowel change or further gross blood.  Also c/o right foot pain with a subq nodule type mass on the plantar aspect at the 2nd MTP, hurts every day to walk on this.  Also mentions he is not overly satisfied with the customer service of his current rheumatologist, but after discussion today will continue to see them.  Pt denies chest pain, increased sob or doe, wheezing, orthopnea, PND, increased LE swelling, palpitations, dizziness or syncope.  Pt denies new neurological symptoms such as new headache, or facial or extremity weakness or numbness   Pt denies polydipsia, polyuria'  Past Medical History:  Diagnosis Date  . Anemia   . Anxiety   . Arthritis   . Bipolar depression ([place])   . CAD, multiple vessel   . Chronic pain disorder    chronic  bilat shoulder pain and feet pain  . Depression   . Erectile dysfunction 01/17/2013  . GERD (gastroesophageal reflux disease)   . History of colon polyps   . Hyperlipidemia   . Hypertension   . Insomnia   . Iron deficiency anemia 01/16/2013  . Mixed hyperlipidemia 03/08/2013  . Posterior neck pain 01/22/2019  . Status post insertion of drug-eluting stent into left anterior descending (LAD) artery for coronary artery disease   . Stented coronary artery 01/16/2013   X 2  . Systolic murmur   . Unspecified vitamin D deficiency 01/17/2013   Past Surgical History:  Procedure Laterality Date  . CAROTID STENT  2008  . CORONARY ANGIOPLASTY WITH STENT PLACEMENT  about 2014   current Cardiology: Dr Orene Desanctis  . EYE SURGERY      reports that he quit smoking about 18 years ago. His smoking use included cigarettes. He has a 20.00 pack-year smoking history. His smokeless tobacco use includes chew. He reports current alcohol use. He reports that he does not use drugs. family history includes Alzheimer's disease in his brother; Cancer in his maternal grandfather, maternal grandmother, and mother; Heart disease in his maternal grandmother, mother, and paternal grandfather; Hyperlipidemia in his maternal grandmother; Lung cancer in his  father; Stroke in his maternal grandmother and paternal grandfather. Allergies  Allergen Reactions  . Lipitor [Atorvastatin] Other (See Comments)    myalgia   Current Outpatient Medications on File Prior to Visit  Medication Sig Dispense Refill  . ALPRAZolam (XANAX) 1 MG tablet TAKE 1 TABLET BY MOUTH TWICE DAILY AS NEEDED 60 tablet 2  . amLODipine (NORVASC) 5 MG tablet Take 1 tablet (5 mg total) by mouth in the morning and at bedtime. 180 tablet 3  . aspirin 81 MG tablet Take 81 mg by mouth daily.    . carbamazepine (TEGRETOL) 200 MG tablet TAKE 1 TABLET BY MOUTH TWICE DAILY 180 tablet 5  . carvedilol (COREG) 12.5 MG tablet TAKE 1 TABLET BY MOUTH TWICE DAILY WITH A  MEAL 90 tablet 3  . Cyanocobalamin (VITAMIN B 12 PO) Take by mouth.    . ferrous sulfate 325 (65 FE) MG EC tablet Take 1 tablet (325 mg total) by mouth 2 (two) times a day. 60 tablet 5  . folic acid (FOLVITE) 1 MG tablet TAKE 1 TABLET BY MOUTH DAILY 90 tablet 0  . HYDROcodone-acetaminophen (NORCO/VICODIN) 5-325 MG tablet Take 1 tablet by mouth every 6 (six) hours as needed for moderate pain. 60 tablet 0  . hydroxychloroquine (PLAQUENIL) 200 MG tablet TAKE 2 TABLETS BY MOUTH DAILY 60 tablet 5  . hydrOXYzine (ATARAX/VISTARIL) 25 MG tablet TAKE 1 TABLET BY MOUTH 3 TIMES DAILY AS NEEDED 270 tablet 1  . leflunomide (ARAVA) 20 MG tablet Take 10 mg by mouth 2 (two) times daily.     . nitroGLYCERIN (NITROSTAT) 0.4 MG SL tablet Place 1 tablet (0.4 mg total) under the tongue every 5 (five) minutes as needed for chest pain. 25 tablet 3  . Omega-3 Fatty Acids (FISH OIL PO) Take by mouth daily.    . pantoprazole (PROTONIX) 40 MG tablet TAKE 1 TABLET BY MOUTH TWICE DAILY 180 tablet 3  . predniSONE (DELTASONE) 10 MG tablet TAKE 1 TABLET BY MOUTH DAILY 90 tablet 1  . ramipril (ALTACE) 5 MG capsule Take 1 capsule (5 mg total) by mouth daily. 90 capsule 3  . rosuvastatin (CRESTOR) 20 MG tablet TAKE 1 TABLET BY MOUTH DAILY 90 tablet 2  . sildenafil (REVATIO) 20 MG tablet Take 3 pills daily as needed 60 tablet 2  . tamsulosin (FLOMAX) 0.4 MG CAPS capsule Take 1 capsule (0.4 mg total) by mouth daily. 90 capsule 3  . traZODone (DESYREL) 50 MG tablet TAKE 1/2-1 TABLET BY MOUTH AT BEDTIME FOR SLEEP 90 tablet 1  . triamcinolone (NASACORT AQ) 55 MCG/ACT AERO nasal inhaler Place 2 sprays into the nose daily. 1 Inhaler 12  . clobetasol cream (TEMOVATE) 0.05 % APPLY TOPICALLY TWICE DAILY AS NEEDED 60 g 1  . hydrocortisone (ANUSOL-HC) 25 MG suppository Place 1 suppository (25 mg total) rectally every 12 (twelve) hours. 12 suppository 1   No current facility-administered medications on file prior to visit.        ROS:  All  others reviewed and negative.  Objective        PE:  BP 138/70   Pulse (!) 57   Temp 98 F (36.7 C) (Oral)   Ht 5\' 10"  (1.778 m)   Wt 167 lb 9.6 oz (76 kg)   SpO2 97%   BMI 24.05 kg/m                 Constitutional: Pt appears in NAD  HENT: Head: NCAT.                Right Ear: External ear normal.                 Left Ear: External ear normal.                Eyes: . Pupils are equal, round, and reactive to light. Conjunctivae and EOM are normal               Nose: without d/c or deformity               Neck: Neck supple. Gross normal ROM               Cardiovascular: Normal rate and regular rhythm.                 Pulmonary/Chest: Effort normal and breath sounds without rales or wheezing.                Abd:  Soft, NT, ND, + BS, no organomegaly               Neurological: Pt is alert. At baseline orientation, motor grossly intact                With tender nodule type mass ? Ganglion cyst at plantar 2nd MT               Skin: Skin is warm. No rashes, no other new lesions, LE edema - none               Psychiatric: Pt behavior is normal without agitation   Assessment/Plan:  Phillip [name] is a 75 y.o. White or Caucasian [1] male with  has a past medical history of Anemia, Anxiety, Arthritis, Bipolar depression ([place]), CAD, multiple vessel, Chronic pain disorder, Depression, Erectile dysfunction (01/17/2013), GERD (gastroesophageal reflux disease), History of colon polyps, Hyperlipidemia, Hypertension, Insomnia, Iron deficiency anemia (01/16/2013), Mixed hyperlipidemia (03/08/2013), Posterior neck pain (01/22/2019), Status post insertion of drug-eluting stent into left anterior descending (LAD) artery for coronary artery disease, Stented coronary artery ([place]), Systolic murmur, and Unspecified vitamin D deficiency (01/17/2013).  Assessment Plan  See notes Labs reviewed for each problem: Lab Results  Component Value Date   WBC 9.1 03/03/2020   HGB 13.0 03/03/2020    HCT 39.6 03/03/2020   PLT 284.0 03/03/2020   GLUCOSE 255 (H) 03/03/2020   CHOL 175 03/03/2020   TRIG 134.0 03/03/2020   HDL 80.10 03/03/2020   LDLDIRECT 142.5 02/27/2014   LDLCALC 68 03/03/2020   ALT 18 03/03/2020   AST 21 03/03/2020   NA 135 03/03/2020   K 4.6 03/03/2020   CL 97 03/03/2020   CREATININE 0.73 03/03/2020   BUN 8 03/03/2020   CO2 32 03/03/2020   TSH 1.67 03/03/2020   PSA 0.75 03/03/2020   INR 1.0 09/15/2008   HGBA1C 8.1 (H) 03/03/2020   MICROALBUR 2.4 (H) 03/03/2020    Micro: none  Cardiac tracings I have personally interpreted today:  none  Pertinent Radiological findings (summarize): none    Health Maintenance Due  Topic Date Due  . OPHTHALMOLOGY EXAM  Never done    There are no preventive care reminders to display for this patient.  Lab Results  Component Value Date   TSH 1.67 03/03/2020   Lab Results  Component Value Date   WBC 9.1 03/03/2020   HGB 13.0 03/03/2020   HCT 39.6 03/03/2020   MCV 90.9  03/03/2020   PLT 284.0 03/03/2020   Lab Results  Component Value Date   NA 135 03/03/2020   K 4.6 03/03/2020   CO2 32 03/03/2020   GLUCOSE 255 (H) 03/03/2020   BUN 8 03/03/2020   CREATININE 0.73 03/03/2020   BILITOT 0.4 03/03/2020   ALKPHOS 59 03/03/2020   AST 21 03/03/2020   ALT 18 03/03/2020   PROT 7.1 03/03/2020   ALBUMIN 4.4 03/03/2020   CALCIUM 9.5 03/03/2020   ANIONGAP 10 09/27/2017   GFR 89.40 03/03/2020   Lab Results  Component Value Date   CHOL 175 03/03/2020   Lab Results  Component Value Date   HDL 80.10 03/03/2020   Lab Results  Component Value Date   LDLCALC 68 03/03/2020   Lab Results  Component Value Date   TRIG 134.0 03/03/2020   Lab Results  Component Value Date   CHOLHDL 2 03/03/2020   Lab Results  Component Value Date   HGBA1C 8.1 (H) 03/03/2020      Assessment & Plan:   Problem List Items Addressed This Visit      High   Encounter for well adult exam with abnormal findings - Primary     Overall doing well, age appropriate education and counseling updated, referrals for preventative services and immunizations addressed, dietary and smoking counseling addressed, most recent labs reviewed.  I have personally reviewed and have noted:  1) the patient's medical and social history 2) The pt's use of alcohol, tobacco, and illicit drugs 3) The patient's current medications and supplements 4) Functional ability including ADL's, fall risk, home safety risk, hearing and visual impairment 5) Diet and physical activities 6) Evidence for depression or mood disorder 7) The patient's height, weight, and BMI have been recorded in the chart  I have made referrals, and provided counseling and education based on review of the above       Relevant Orders   PSA (Completed)     Medium   Rheumatoid arthritis ([place])    Pt thinking he might want a new theumatologist but I encouraged him to continue with his current provider since he I doing well      Iron deficiency anemia    Also for f/u lab,  to f/u any worsening symptoms or concerns      Hypertension (Chronic)    BP Readings from Last 3 Encounters:  03/03/20 138/70  12/07/19 (!) 142/70  11/30/19 (!) 136/56   Stable, pt to continue medical treatment amlodipine, coreg, altace  Current Outpatient Medications (Endocrine & Metabolic):  .  predniSONE (DELTASONE) 10 MG tablet, TAKE 1 TABLET BY MOUTH DAILY .  metFORMIN (GLUCOPHAGE-XR) 500 MG 24 hr tablet, Take 2 tablets (1,000 mg total) by mouth daily with breakfast.  Current Outpatient Medications (Cardiovascular):  .  amLODipine (NORVASC) 5 MG tablet, Take 1 tablet (5 mg total) by mouth in the morning and at bedtime. .  carvedilol (COREG) 12.5 MG tablet, TAKE 1 TABLET BY MOUTH TWICE DAILY WITH A MEAL .  nitroGLYCERIN (NITROSTAT) 0.4 MG SL tablet, Place 1 tablet (0.4 mg total) under the tongue every 5 (five) minutes as needed for chest pain. .  ramipril (ALTACE) 5 MG capsule, Take 1  capsule (5 mg total) by mouth daily. .  rosuvastatin (CRESTOR) 20 MG tablet, TAKE 1 TABLET BY MOUTH DAILY .  sildenafil (REVATIO) 20 MG tablet, Take 3 pills daily as needed  Current Outpatient Medications (Respiratory):  .  triamcinolone (NASACORT AQ) 55 MCG/ACT AERO nasal inhaler, Place  2 sprays into the nose daily.  Current Outpatient Medications (Analgesics):  .  aspirin 81 MG tablet, Take 81 mg by mouth daily. Marland Kitchen  HYDROcodone-acetaminophen (NORCO/VICODIN) 5-325 MG tablet, Take 1 tablet by mouth every 6 (six) hours as needed for moderate pain. Marland Kitchen  leflunomide (ARAVA) 20 MG tablet, Take 10 mg by mouth 2 (two) times daily.   Current Outpatient Medications (Hematological):  Marland Kitchen  Cyanocobalamin (VITAMIN B 12 PO), Take by mouth. .  ferrous sulfate 325 (65 FE) MG EC tablet, Take 1 tablet (325 mg total) by mouth 2 (two) times a day. .  folic acid (FOLVITE) 1 MG tablet, TAKE 1 TABLET BY MOUTH DAILY  Current Outpatient Medications (Other):  Marland Kitchen  ALPRAZolam (XANAX) 1 MG tablet, TAKE 1 TABLET BY MOUTH TWICE DAILY AS NEEDED .  carbamazepine (TEGRETOL) 200 MG tablet, TAKE 1 TABLET BY MOUTH TWICE DAILY .  hydroxychloroquine (PLAQUENIL) 200 MG tablet, TAKE 2 TABLETS BY MOUTH DAILY .  hydrOXYzine (ATARAX/VISTARIL) 25 MG tablet, TAKE 1 TABLET BY MOUTH 3 TIMES DAILY AS NEEDED .  Omega-3 Fatty Acids (FISH OIL PO), Take by mouth daily. .  pantoprazole (PROTONIX) 40 MG tablet, TAKE 1 TABLET BY MOUTH TWICE DAILY .  tamsulosin (FLOMAX) 0.4 MG CAPS capsule, Take 1 capsule (0.4 mg total) by mouth daily. .  traZODone (DESYREL) 50 MG tablet, TAKE 1/2-1 TABLET BY MOUTH AT BEDTIME FOR SLEEP .  clobetasol cream (TEMOVATE) 0.05 %, APPLY TOPICALLY TWICE DAILY AS NEEDED .  hydrocortisone (ANUSOL-HC) 25 MG suppository, Place 1 suppository (25 mg total) rectally every 12 (twelve) hours.      Hyperlipidemia    Lab Results  Component Value Date   LDLCALC 68 03/03/2020   Stable, pt to continue current statin crestor  20       Diabetes ([place])    Lab Results  Component Value Date   HGBA1C 8.1 (H) 03/03/2020   Stable, pt to continue current medical treatment metformin      Relevant Orders   Microalbumin / creatinine urine ratio (Completed)   Hemoglobin A1c (Completed)   Lipid panel (Completed)   Hepatic function panel (Completed)   CBC with Differential/Platelet (Completed)   TSH (Completed)   Urinalysis, Routine w reflex microscopic (Completed)   Basic metabolic panel (Completed)     Low   Right foot pain    With tender nodule type mass ? Ganglion cyst at plantar 2nd MTP - for podiatry referral      Relevant Orders   Ambulatory referral to Podiatry   Pain involving joints of fingers of both hands    Ok for hand surgury referral      Relevant Orders   Ambulatory referral to Hand Surgery   Foreign body in skin of finger    Ok for hand surgury referral      Relevant Orders   Ambulatory referral to Hand Surgery   Fecal occult blood test positive    Ok for GI referral as requested       Other Visit Diagnoses    Vitamin D deficiency       Relevant Orders   VITAMIN D 25 Hydroxy (Vit-D Deficiency, Fractures) (Completed)   B12 deficiency       Relevant Orders   Vitamin B12 (Completed)   Need for influenza vaccination       Relevant Orders   Flu Vaccine QUAD High Dose(Fluad) (Completed)      No orders of the defined types were placed in this encounter.  Follow-up: Return in about 6 months (around 08/31/2020).   Cathlean Cower, MD 03/03/2020 10:48 AM [facility]

## 2020-03-03 NOTE — Patient Instructions (Addendum)
You had the flu shot today  Please continue all other medications as before, and refills have been done if requested.  Please have the pharmacy call with any other refills you may need.  Please continue your efforts at being more active, low cholesterol diet, and weight control.  You are otherwise up to date with prevention measures today.  Please keep your appointments with your specialists as you may have planned  You will be contacted regarding the referral for: Gastroenterology, Hand Surgury, and Podiatry  Please go to the LAB at the blood drawing area for the tests to be done  You will be contacted by phone if any changes need to be made immediately.  Otherwise, you will receive a letter about your results with an explanation, but please check with MyChart first.  Please remember to sign up for MyChart if you have not done so, as this will be important to you in the future with finding out test results, communicating by private email, and scheduling acute appointments online when needed.  Please make an Appointment to return in 6 months, or sooner if needed

## 2020-03-04 ENCOUNTER — Other Ambulatory Visit: Payer: Self-pay | Admitting: Internal Medicine

## 2020-03-04 ENCOUNTER — Encounter: Payer: Self-pay | Admitting: Internal Medicine

## 2020-03-04 LAB — MICROALBUMIN / CREATININE URINE RATIO
Creatinine,U: 48.4 mg/dL
Microalb Creat Ratio: 5 mg/g (ref 0.0–30.0)
Microalb, Ur: 2.4 mg/dL — ABNORMAL HIGH (ref 0.0–1.9)

## 2020-03-04 MED ORDER — METFORMIN HCL ER 500 MG PO TB24: 1000.0000 mg | ORAL_TABLET | Freq: Every day | ORAL | 3 refills | Status: DC

## 2020-03-04 NOTE — Telephone Encounter (Signed)
Called pt there was no answer LMOM w/MD response../lmb 

## 2020-03-05 ENCOUNTER — Encounter: Payer: Self-pay | Admitting: Internal Medicine

## 2020-03-07 ENCOUNTER — Ambulatory Visit: Payer: Self-pay

## 2020-03-07 ENCOUNTER — Encounter: Payer: Self-pay | Admitting: Orthopaedic Surgery

## 2020-03-07 ENCOUNTER — Ambulatory Visit: Payer: Medicare Other | Admitting: Orthopaedic Surgery

## 2020-03-07 ENCOUNTER — Other Ambulatory Visit: Payer: Self-pay | Admitting: Physician Assistant

## 2020-03-07 VITALS — Ht 70.0 in | Wt 160.0 lb

## 2020-03-07 DIAGNOSIS — M79642 Pain in left hand: Secondary | ICD-10-CM

## 2020-03-07 DIAGNOSIS — M79641 Pain in right hand: Secondary | ICD-10-CM

## 2020-03-07 NOTE — Progress Notes (Signed)
Office Visit Note   Patient: Phillip [name]           Date of Birth: 09/05/1945           MRN: [id number] Visit Date: 03/07/2020              Requested by: Biagio Borg, MD [address] [place],  [place] [id number] PCP: Biagio Borg, MD   Assessment & Plan: Visit Diagnoses:  1. Bilateral hand pain     Plan: Impression is probable rheumatologic manifestation.  We have discussed the importance of compliance with his current rheumatoid medication and the need for follow-up with his rheumatologist.  We also discussed MRI but he is not interested in this now.  He will follow-up with [place] as needed.  Follow-Up Instructions: Return if symptoms worsen or fail to improve.   Orders:  Orders Placed This Encounter  Procedures  . XR Hand Complete Left  . XR Hand Complete Right   No orders of the defined types were placed in this encounter.     Procedures: No procedures performed   Clinical Data: No additional findings.   Subjective: Chief Complaint  Patient presents with  . Right Hand - Pain  . Left Hand - Pain    HPI patient is a pleasant 75 year old right-hand-dominant gentleman who comes in today with bilateral hand nodules to both thumbs and ring fingers.  He noticed these several years ago where he states it would come and go and recently they have come and have actually grown in size.  He describes a constant pain with tenderness to the touch.  He denies any skin changes or drainage.  He notes burning sensations to the area as well.  He does have underlying diabetes and he has underlying rheumatoid arthritis as well.  Currently on [place] for this.  He notes that his insurance is not sufficient enough to go on something better according to his rheumatologist.  He denies any fevers or chills or any other systemic symptoms.  No history of gout.  Review of Systems as detailed in HPI.  All others reviewed and are negative.   Objective: Vital Signs: Ht 5\' 10"  (1.778 m)   Wt  160 lb (72.6 kg)   BMI 22.96 kg/m   Physical Exam well-developed well-nourished gentleman in no acute distress.  Alert oriented x3.  Ortho Exam bilateral hand exam shows palpable and tender nodules to the volar thumb and ring fingers at the DIP joints.  There is no erythema or drainage noted.  No other signs of infection.  He has full range of motion.  Full strength.  He is neurovascular intact distally.  Specialty Comments:  No specialty comments available.  Imaging: XR Hand Complete Left  Result Date: 03/07/2020 X-rays demonstrate bony erosion with osteophyte formation to the thumb  XR Hand Complete Right  Result Date: 03/07/2020 X-rays demonstrate bony erosion with osteophyte formation to the thumb    PMFS History: Patient Active Problem List   Diagnosis Date Noted  . Fecal occult blood test positive 03/03/2020  . Pain involving joints of fingers of both hands 03/03/2020  . Foreign body in skin of finger 03/03/2020  . Alcohol abuse 12/09/2019  . BPH associated with nocturia 11/04/2019  . Hemorrhoid 11/04/2019  . Posterior neck pain 01/22/2019  . Dizziness 06/26/2018  . Chest pain with moderate risk of acute coronary syndrome 06/01/2016  . Cough 06/01/2016  . Allergic rhinitis 06/01/2016  . Pulmonary nodules 06/01/2016  .  Other chest pain 12/02/2015  . Left shoulder pain 03/11/2015  . Right foot pain 03/11/2015  . Skin abscess 09/19/2014  . Diabetes ([place]) 02/27/2014  . Exertional angina ([place]) 12/17/2013  . Rheumatoid arthritis ([place]) 05/23/2013  . Polyarthralgia 05/15/2013  . Myalgia 05/15/2013  . Bilateral hand pain 03/07/2013  . Left wrist pain 03/07/2013  . Right ankle pain 03/07/2013  . Unspecified vitamin D deficiency 01/17/2013  . Erectile dysfunction 01/17/2013  . Rash and nonspecific skin eruption 01/16/2013  . Encounter for well adult exam with abnormal findings 01/16/2013  . 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 Bilateral hearing loss 01/16/2013  .  Stented coronary artery 01/16/2013  . Iron deficiency anemia 01/16/2013  . Arthritis   . Hypertension   . Hyperlipidemia   . Anxiety   . Depression   . CAD -S/P PCI 2008, 2010   . History of colon polyps   . Insomnia   . Bipolar depression ([place])   . GERD (gastroesophageal reflux disease)   . Chronic pain disorder    Past Medical History:  Diagnosis Date  . Anemia   . Anxiety   . Arthritis   . Bipolar depression ([place])   . CAD, multiple vessel   . Chronic pain disorder    chronic bilat shoulder pain and feet pain  . Depression   . Erectile dysfunction 01/17/2013  . GERD (gastroesophageal reflux disease)   . History of colon polyps   . Hyperlipidemia   . Hypertension   . Insomnia   . Iron deficiency anemia 01/16/2013  . Mixed hyperlipidemia 03/08/2013  . Posterior neck pain 01/22/2019  . Status post insertion of drug-eluting stent into left anterior descending (LAD) artery for coronary artery disease   . Stented coronary artery 01/16/2013   X 2  . Systolic murmur   . Unspecified vitamin D deficiency 01/17/2013    Family History  Problem Relation Age of Onset  . Cancer Mother   . Heart disease Mother   . Heart disease Maternal Grandmother   . Stroke Maternal Grandmother   . Hyperlipidemia Maternal Grandmother   . Cancer Maternal Grandmother   . Lung cancer Father   . Alzheimer's disease Brother   . Cancer Maternal Grandfather   . Heart disease Paternal Grandfather   . Stroke Paternal Grandfather     Past Surgical History:  Procedure Laterality Date  . CAROTID STENT  2008  . CORONARY ANGIOPLASTY WITH STENT PLACEMENT  about 2014   current Cardiology: Dr Orene Desanctis  . EYE SURGERY     Social History   Occupational History  . Not on file  Tobacco Use  . Smoking status: Former Smoker    Packs/day: 1.00    Years: 20.00    Pack years: 20.00    Types: Cigarettes    Quit date: 02/22/2002    Years since quitting: 18.0  . Smokeless tobacco: Current User    Types:  Chew  Substance and Sexual Activity  . Alcohol use: Yes  . Drug use: No  . Sexual activity: Not on file

## 2020-03-10 ENCOUNTER — Encounter: Payer: Self-pay | Admitting: Internal Medicine

## 2020-03-10 NOTE — Assessment & Plan Note (Signed)
Pt thinking he might want a new theumatologist but I encouraged him to continue with his current provider since he I doing well

## 2020-03-10 NOTE — Assessment & Plan Note (Signed)
Lab Results  Component Value Date   LDLCALC 68 03/03/2020   Stable, pt to continue current statin crestor 20

## 2020-03-10 NOTE — Assessment & Plan Note (Signed)
Ok for GI referral as requested

## 2020-03-10 NOTE — Assessment & Plan Note (Signed)
With tender nodule type mass ? Ganglion cyst at plantar 2nd MTP - for podiatry referral

## 2020-03-10 NOTE — Assessment & Plan Note (Signed)
Lab Results  Component Value Date   HGBA1C 8.1 (H) 03/03/2020   Stable, pt to continue current medical treatment metformin

## 2020-03-10 NOTE — Assessment & Plan Note (Signed)

## 2020-03-10 NOTE — Assessment & Plan Note (Signed)
BP Readings from Last 3 Encounters:  03/03/20 138/70  12/07/19 (!) 142/70  11/30/19 (!) 136/56   Stable, pt to continue medical treatment amlodipine, coreg, altace  Current Outpatient Medications (Endocrine & Metabolic):  .  predniSONE (DELTASONE) 10 MG tablet, TAKE 1 TABLET BY MOUTH DAILY .  metFORMIN (GLUCOPHAGE-XR) 500 MG 24 hr tablet, Take 2 tablets (1,000 mg total) by mouth daily with breakfast.  Current Outpatient Medications (Cardiovascular):  .  amLODipine (NORVASC) 5 MG tablet, Take 1 tablet (5 mg total) by mouth in the morning and at bedtime. .  carvedilol (COREG) 12.5 MG tablet, TAKE 1 TABLET BY MOUTH TWICE DAILY WITH A MEAL .  nitroGLYCERIN (NITROSTAT) 0.4 MG SL tablet, Place 1 tablet (0.4 mg total) under the tongue every 5 (five) minutes as needed for chest pain. .  ramipril (ALTACE) 5 MG capsule, Take 1 capsule (5 mg total) by mouth daily. .  rosuvastatin (CRESTOR) 20 MG tablet, TAKE 1 TABLET BY MOUTH DAILY .  sildenafil (REVATIO) 20 MG tablet, Take 3 pills daily as needed  Current Outpatient Medications (Respiratory):  .  triamcinolone (NASACORT AQ) 55 MCG/ACT AERO nasal inhaler, Place 2 sprays into the nose daily.  Current Outpatient Medications (Analgesics):  .  aspirin 81 MG tablet, Take 81 mg by mouth daily. Marland Kitchen  HYDROcodone-acetaminophen (NORCO/VICODIN) 5-325 MG tablet, Take 1 tablet by mouth every 6 (six) hours as needed for moderate pain. Marland Kitchen  leflunomide (ARAVA) 20 MG tablet, Take 10 mg by mouth 2 (two) times daily.   Current Outpatient Medications (Hematological):  Marland Kitchen  Cyanocobalamin (VITAMIN B 12 PO), Take by mouth. .  ferrous sulfate 325 (65 FE) MG EC tablet, Take 1 tablet (325 mg total) by mouth 2 (two) times a day. .  folic acid (FOLVITE) 1 MG tablet, TAKE 1 TABLET BY MOUTH DAILY  Current Outpatient Medications (Other):  Marland Kitchen  ALPRAZolam (XANAX) 1 MG tablet, TAKE 1 TABLET BY MOUTH TWICE DAILY AS NEEDED .  carbamazepine (TEGRETOL) 200 MG tablet, TAKE 1 TABLET BY  MOUTH TWICE DAILY .  hydroxychloroquine (PLAQUENIL) 200 MG tablet, TAKE 2 TABLETS BY MOUTH DAILY .  hydrOXYzine (ATARAX/VISTARIL) 25 MG tablet, TAKE 1 TABLET BY MOUTH 3 TIMES DAILY AS NEEDED .  Omega-3 Fatty Acids (FISH OIL PO), Take by mouth daily. .  pantoprazole (PROTONIX) 40 MG tablet, TAKE 1 TABLET BY MOUTH TWICE DAILY .  tamsulosin (FLOMAX) 0.4 MG CAPS capsule, Take 1 capsule (0.4 mg total) by mouth daily. .  traZODone (DESYREL) 50 MG tablet, TAKE 1/2-1 TABLET BY MOUTH AT BEDTIME FOR SLEEP .  clobetasol cream (TEMOVATE) 0.05 %, APPLY TOPICALLY TWICE DAILY AS NEEDED .  hydrocortisone (ANUSOL-HC) 25 MG suppository, Place 1 suppository (25 mg total) rectally every 12 (twelve) hours.

## 2020-03-10 NOTE — Assessment & Plan Note (Signed)
Also for f/u lab,  to f/u any worsening symptoms or concerns 

## 2020-03-10 NOTE — Assessment & Plan Note (Signed)
Ok for hand surgury referral 

## 2020-03-11 DIAGNOSIS — E119 Type 2 diabetes mellitus without complications: Secondary | ICD-10-CM | POA: Diagnosis not present

## 2020-03-17 ENCOUNTER — Ambulatory Visit: Payer: Medicare Other | Admitting: Podiatry

## 2020-03-17 ENCOUNTER — Ambulatory Visit (INDEPENDENT_AMBULATORY_CARE_PROVIDER_SITE_OTHER): Payer: Medicare Other

## 2020-03-17 ENCOUNTER — Other Ambulatory Visit: Payer: Self-pay

## 2020-03-17 ENCOUNTER — Encounter: Payer: Self-pay | Admitting: Podiatry

## 2020-03-17 DIAGNOSIS — M2041 Other hammer toe(s) (acquired), right foot: Secondary | ICD-10-CM

## 2020-03-17 DIAGNOSIS — M779 Enthesopathy, unspecified: Secondary | ICD-10-CM

## 2020-03-17 DIAGNOSIS — M79671 Pain in right foot: Secondary | ICD-10-CM

## 2020-03-17 DIAGNOSIS — M79672 Pain in left foot: Secondary | ICD-10-CM

## 2020-03-17 NOTE — Progress Notes (Signed)
Subjective:   Patient ID: Phillip [name], male   DOB: 75 y.o.   MRN: [id number]   HPI Patient presents with a lot of pain on top of the right foot states it is been inflamed and hard to walk with.  Left foot also hurts not to the same degree and this is been going on for years and he knows that he has hammertoes and walks hard on his metatarsals.  Patient does not smoke and is not active   Review of Systems  All other systems reviewed and are negative.       Objective:  Physical Exam Vitals and nursing note reviewed.  Constitutional:      Appearance: He is well-developed and well-nourished.  Cardiovascular:     Pulses: Intact distal pulses.  Pulmonary:     Effort: Pulmonary effort is normal.  Musculoskeletal:        General: Normal range of motion.  Skin:    General: Skin is warm.  Neurological:     Mental Status: He is alert.     Neurovascular status was found to be intact muscle strength was found to be adequate range of motion adequate.  Patient does have rigid contracture lesser digits right over left which is creating pressure against the metatarsals and is creating inflammation with a lot of fluid around the second MPJ right foot.  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  Patient will be seen back to see the response to this medication  X-rays indicate structural hammertoe deformity right over left with pressure against the metatarsals

## 2020-03-18 DIAGNOSIS — L739 Follicular disorder, unspecified: Secondary | ICD-10-CM | POA: Diagnosis not present

## 2020-03-18 DIAGNOSIS — L570 Actinic keratosis: Secondary | ICD-10-CM | POA: Diagnosis not present

## 2020-03-18 DIAGNOSIS — L820 Inflamed seborrheic keratosis: Secondary | ICD-10-CM | POA: Diagnosis not present

## 2020-03-19 ENCOUNTER — Other Ambulatory Visit: Payer: Self-pay | Admitting: Internal Medicine

## 2020-03-24 DIAGNOSIS — M0579 Rheumatoid arthritis with rheumatoid factor of multiple sites without organ or systems involvement: Secondary | ICD-10-CM | POA: Diagnosis not present

## 2020-03-24 DIAGNOSIS — M2550 Pain in unspecified joint: Secondary | ICD-10-CM | POA: Diagnosis not present

## 2020-03-24 DIAGNOSIS — M150 Primary generalized (osteo)arthritis: Secondary | ICD-10-CM | POA: Diagnosis not present

## 2020-03-24 DIAGNOSIS — R5382 Chronic fatigue, unspecified: Secondary | ICD-10-CM | POA: Diagnosis not present

## 2020-04-14 ENCOUNTER — Ambulatory Visit: Payer: Medicare Other | Admitting: Podiatry

## 2020-05-05 ENCOUNTER — Other Ambulatory Visit: Payer: Self-pay | Admitting: Internal Medicine

## 2020-06-18 ENCOUNTER — Other Ambulatory Visit: Payer: Self-pay | Admitting: Internal Medicine

## 2020-07-29 ENCOUNTER — Other Ambulatory Visit: Payer: Self-pay | Admitting: Cardiovascular Disease

## 2020-07-29 DIAGNOSIS — E785 Hyperlipidemia, unspecified: Secondary | ICD-10-CM

## 2020-08-07 ENCOUNTER — Other Ambulatory Visit: Payer: Self-pay | Admitting: Internal Medicine

## 2020-08-09 DIAGNOSIS — S51811A Laceration without foreign body of right forearm, initial encounter: Secondary | ICD-10-CM | POA: Diagnosis not present

## 2020-08-20 DIAGNOSIS — D2239 Melanocytic nevi of other parts of face: Secondary | ICD-10-CM | POA: Diagnosis not present

## 2020-08-20 DIAGNOSIS — L820 Inflamed seborrheic keratosis: Secondary | ICD-10-CM | POA: Diagnosis not present

## 2020-08-20 DIAGNOSIS — D1801 Hemangioma of skin and subcutaneous tissue: Secondary | ICD-10-CM | POA: Diagnosis not present

## 2020-08-20 DIAGNOSIS — D225 Melanocytic nevi of trunk: Secondary | ICD-10-CM | POA: Diagnosis not present

## 2020-09-01 ENCOUNTER — Ambulatory Visit: Payer: Medicare Other | Admitting: Internal Medicine

## 2020-09-05 ENCOUNTER — Encounter: Payer: Self-pay | Admitting: Internal Medicine

## 2020-09-05 ENCOUNTER — Ambulatory Visit (INDEPENDENT_AMBULATORY_CARE_PROVIDER_SITE_OTHER): Payer: Medicare Other | Admitting: Internal Medicine

## 2020-09-05 ENCOUNTER — Other Ambulatory Visit: Payer: Self-pay

## 2020-09-05 VITALS — BP 146/70 | HR 60 | Temp 98.1°F | Ht 70.0 in | Wt 169.0 lb

## 2020-09-05 DIAGNOSIS — I10 Essential (primary) hypertension: Secondary | ICD-10-CM

## 2020-09-05 DIAGNOSIS — E538 Deficiency of other specified B group vitamins: Secondary | ICD-10-CM

## 2020-09-05 DIAGNOSIS — E782 Mixed hyperlipidemia: Secondary | ICD-10-CM

## 2020-09-05 DIAGNOSIS — E559 Vitamin D deficiency, unspecified: Secondary | ICD-10-CM

## 2020-09-05 DIAGNOSIS — E1165 Type 2 diabetes mellitus with hyperglycemia: Secondary | ICD-10-CM

## 2020-09-05 DIAGNOSIS — D508 Other iron deficiency anemias: Secondary | ICD-10-CM | POA: Diagnosis not present

## 2020-09-05 LAB — FERRITIN: Ferritin: 25.1 ng/mL (ref 22.0–322.0)

## 2020-09-05 LAB — VITAMIN D 25 HYDROXY (VIT D DEFICIENCY, FRACTURES): VITD: 49.19 ng/mL (ref 30.00–100.00)

## 2020-09-05 LAB — LIPID PANEL
Cholesterol: 154 mg/dL (ref 0–200)
HDL: 69.4 mg/dL (ref >39.00)
LDL Cholesterol: 56 mg/dL (ref 0–99)
NonHDL: 84.33
Total CHOL/HDL Ratio: 2
Triglycerides: 141 mg/dL (ref 0.0–149.0)
VLDL: 28.2 mg/dL (ref 0.0–40.0)

## 2020-09-05 LAB — VITAMIN B12: Vitamin B-12: 454 pg/mL (ref 211–911)

## 2020-09-05 LAB — BASIC METABOLIC PANEL
BUN: 11 mg/dL (ref 6–23)
CO2: 29 mEq/L (ref 19–32)
Calcium: 9.4 mg/dL (ref 8.4–10.5)
Chloride: 98 mEq/L (ref 96–112)
Creatinine, Ser: 0.68 mg/dL (ref 0.40–1.50)
GFR: 91.01 mL/min (ref >60.00)
Glucose, Bld: 145 mg/dL — ABNORMAL HIGH (ref 70–99)
Potassium: 3.8 mEq/L (ref 3.5–5.1)
Sodium: 136 mEq/L (ref 135–145)

## 2020-09-05 LAB — CBC WITH DIFFERENTIAL/PLATELET
Basophils Absolute: 0.1 10*3/uL (ref 0.0–0.1)
Basophils Relative: 0.8 % (ref 0.0–3.0)
Eosinophils Absolute: 0.2 10*3/uL (ref 0.0–0.7)
Eosinophils Relative: 2.3 % (ref 0.0–5.0)
HCT: 36.9 % — ABNORMAL LOW (ref 39.0–52.0)
Hemoglobin: 12.8 g/dL — ABNORMAL LOW (ref 13.0–17.0)
Lymphocytes Relative: 21.4 % (ref 12.0–46.0)
Lymphs Abs: 1.5 10*3/uL (ref 0.7–4.0)
MCHC: 34.6 g/dL (ref 30.0–36.0)
MCV: 89.1 fl (ref 78.0–100.0)
Monocytes Absolute: 0.7 10*3/uL (ref 0.1–1.0)
Monocytes Relative: 9.5 % (ref 3.0–12.0)
Neutro Abs: 4.5 10*3/uL (ref 1.4–7.7)
Neutrophils Relative %: 66 % (ref 43.0–77.0)
Platelets: 305 10*3/uL (ref 150.0–400.0)
RBC: 4.14 Mil/uL — ABNORMAL LOW (ref 4.22–5.81)
RDW: 12.8 % (ref 11.5–15.5)
WBC: 6.9 10*3/uL (ref 4.0–10.5)

## 2020-09-05 LAB — IBC PANEL
Iron: 52 ug/dL (ref 42–165)
Saturation Ratios: 13.3 % — ABNORMAL LOW (ref 20.0–50.0)
Transferrin: 279 mg/dL (ref 212.0–360.0)

## 2020-09-05 LAB — HEPATIC FUNCTION PANEL
ALT: 16 U/L (ref 0–53)
AST: 18 U/L (ref 0–37)
Albumin: 4.4 g/dL (ref 3.5–5.2)
Alkaline Phosphatase: 53 U/L (ref 39–117)
Bilirubin, Direct: 0.1 mg/dL (ref 0.0–0.3)
Total Bilirubin: 0.2 mg/dL (ref 0.2–1.2)
Total Protein: 7.2 g/dL (ref 6.0–8.3)

## 2020-09-05 LAB — HEMOGLOBIN A1C: Hgb A1c MFr Bld: 7.6 % — ABNORMAL HIGH (ref 4.6–6.5)

## 2020-09-05 NOTE — Patient Instructions (Addendum)
Ok to increase the metformin ER 500 mg to 2 pills in the AM  Please continue all other medications as before, and refills have been done if requested.  Please have the pharmacy call with any other refills you may need.  Please continue your efforts at being more active, low cholesterol diet, and weight control.  Please keep your appointments with your specialists as you may have planned  Please go to the LAB at the blood drawing area for the tests to be done  You will be contacted by phone if any changes need to be made immediately.  Otherwise, you will receive a letter about your results with an explanation, but please check with MyChart first.  Please remember to sign up for MyChart if you have not done so, as this will be important to you in the future with finding out test results, communicating by private email, and scheduling acute appointments online when needed.  Please make an Appointment to return in 6 months, or sooner if needed

## 2020-09-05 NOTE — Progress Notes (Signed)
Patient ID: Phillip [name], male   DOB: February 01, 1946, 75 y.o.   MRN: [id number]        Chief Complaint: follow up HTN, HLD and DM, anemia       HPI:  Phillip [name] is a 75 y.o. male here to f/u , overall doing well; Pt denies chest pain, increased sob or doe, wheezing, orthopnea, PND, increased LE swelling, palpitations, dizziness or syncope.   Pt denies polydipsia, polyuria, or new focal neuro s/s. Though he admits to only taking 1/2 of a metformin ER 500 gm daily due to wariness of  GI side effect that one of his friends had.  Denies worsening reflux, abd pain, dysphagia, n/v, bowel change or blood.   Trying fo follow lower chol DM diet.  No other new complaints  BP has been < 140/90 at home though not checked very recently.  Overall good compliance with meds BP Readings from Last 3 Encounters:  09/05/20 (!) 146/70  03/03/20 138/70  12/07/19 (!) 142/70         Past Medical History:  Diagnosis Date   Anemia    Anxiety    Arthritis    Bipolar depression ([place])    CAD, multiple vessel    Chronic pain disorder    chronic bilat shoulder pain and feet pain   Depression    Erectile dysfunction 01/17/2013   GERD (gastroesophageal reflux disease)    History of colon polyps    Hyperlipidemia    Hypertension    Insomnia    Iron deficiency anemia 01/16/2013   Mixed hyperlipidemia 03/08/2013   Posterior neck pain 01/22/2019   Status post insertion of drug-eluting stent into left anterior descending (LAD) artery for coronary artery disease    Stented coronary artery 52/77/8242   X 2   Systolic murmur    Unspecified vitamin D deficiency 01/17/2013   Past Surgical History:  Procedure Laterality Date   CAROTID STENT  2008   CORONARY ANGIOPLASTY WITH STENT PLACEMENT  about 2014   current Cardiology: Dr Orene Desanctis   [place]      reports that he quit smoking about 18 years ago. His smoking use included cigarettes. He has a 20.00 pack-year smoking history. His smokeless tobacco use includes  chew. He reports current alcohol use. He reports that he does not use drugs. family history includes Alzheimer's disease in his brother; Cancer in his maternal grandfather, maternal grandmother, and mother; Heart disease in his maternal grandmother, mother, and paternal grandfather; Hyperlipidemia in his maternal grandmother; Lung cancer in his father; Stroke in his maternal grandmother and paternal grandfather. Allergies  Allergen Reactions   Lipitor [Atorvastatin] Other (See Comments)    myalgia   Current Outpatient Medications on File Prior to Visit  Medication Sig Dispense Refill   ALPRAZolam (XANAX) 1 MG tablet TAKE 1 TABLET BY MOUTH TWICE DAILY AS NEEDED 60 tablet 2   amLODipine (NORVASC) 5 MG tablet Take 1 tablet (5 mg total) by mouth in the morning and at bedtime. 180 tablet 3   amoxicillin (AMOXIL) 500 MG capsule Take 500 mg by mouth 3 (three) times daily.     aspirin 81 MG tablet Take 81 mg by mouth daily.     carbamazepine (TEGRETOL) 200 MG tablet TAKE 1 TABLET BY MOUTH TWICE DAILY 180 tablet 5   carvedilol (COREG) 12.5 MG tablet TAKE 1 TABLET BY MOUTH TWICE DAILY WITH A MEAL 90 tablet 3   clobetasol cream (TEMOVATE) 0.05 % APPLY TOPICALLY TWICE DAILY AS  NEEDED 60 g 1   Cyanocobalamin (VITAMIN B 12 PO) Take by mouth.     ferrous sulfate 325 (65 FE) MG EC tablet Take 1 tablet (325 mg total) by mouth 2 (two) times a day. 60 tablet 5   folic acid (FOLVITE) 1 MG tablet TAKE 1 TABLET BY MOUTH DAILY 90 tablet 0   HYDROcodone-acetaminophen (NORCO/VICODIN) 5-325 MG tablet Take 1 tablet by mouth every 6 (six) hours as needed for moderate pain. 60 tablet 0   hydrocortisone (ANUSOL-HC) 25 MG suppository Place 1 suppository (25 mg total) rectally every 12 (twelve) hours. 12 suppository 1   hydroxychloroquine (PLAQUENIL) 200 MG tablet TAKE 2 TABLETS BY MOUTH DAILY 60 tablet 5   hydrOXYzine (ATARAX/VISTARIL) 25 MG tablet TAKE 1 TABLET BY MOUTH 3 TIMES DAILY AS NEEDED 270 tablet 1   leflunomide  (ARAVA) 20 MG tablet Take 10 mg by mouth 2 (two) times daily.      metFORMIN (GLUCOPHAGE-XR) 500 MG 24 hr tablet Take 2 tablets (1,000 mg total) by mouth daily with breakfast. 180 tablet 3   nitroGLYCERIN (NITROSTAT) 0.4 MG SL tablet Place 1 tablet (0.4 mg total) under the tongue every 5 (five) minutes as needed for chest pain. 25 tablet 3   Omega-3 Fatty Acids (FISH OIL PO) Take by mouth daily.     pantoprazole (PROTONIX) 40 MG tablet TAKE 1 TABLET BY MOUTH TWICE DAILY 180 tablet 3   predniSONE (DELTASONE) 10 MG tablet TAKE 1 TABLET BY MOUTH DAILY 90 tablet 1   ramipril (ALTACE) 5 MG capsule Take 1 capsule (5 mg total) by mouth daily. 90 capsule 3   rosuvastatin (CRESTOR) 20 MG tablet TAKE 1 TABLET BY MOUTH DAILY 90 tablet 3   sildenafil (REVATIO) 20 MG tablet Take 3 pills daily as needed 60 tablet 2   tamsulosin (FLOMAX) 0.4 MG CAPS capsule Take 1 capsule (0.4 mg total) by mouth daily. 90 capsule 3   traZODone (DESYREL) 50 MG tablet TAKE 1/2-1 TABLET BY MOUTH AT BEDTIME FOR SLEEP 90 tablet 1   triamcinolone (NASACORT AQ) 55 MCG/ACT AERO nasal inhaler Place 2 sprays into the nose daily. 1 Inhaler 12   No current facility-administered medications on file prior to visit.        ROS:  All others reviewed and negative.  Objective        PE:  BP (!) 146/70 (BP Location: Left Arm, Patient Position: Sitting, Cuff Size: Normal)   Pulse 60   Temp 98.1 F (36.7 C) (Oral)   Ht 5\' 10"  (1.778 m)   Wt 169 lb (76.7 kg)   SpO2 98%   BMI 24.25 kg/m                 Constitutional: Pt appears in NAD               HENT: Head: NCAT.                Right Ear: External ear normal.                 Left Ear: External ear normal.                Eyes: . Pupils are equal, round, and reactive to light. Conjunctivae and EOM are normal               Nose: without d/c or deformity               Neck: Neck supple. Gross normal  ROM               Cardiovascular: Normal rate and regular rhythm.                  Pulmonary/Chest: Effort normal and breath sounds without rales or wheezing.                Abd:  Soft, NT, ND, + BS, no organomegaly               Neurological: Pt is alert. At baseline orientation, motor grossly intact               Skin: Skin is warm. No rashes, no other new lesions, LE edema - none               Psychiatric: Pt behavior is normal without agitation   Micro: none  Cardiac tracings I have personally interpreted today:  none  Pertinent Radiological findings (summarize): none   Lab Results  Component Value Date   WBC 6.9 09/05/2020   HGB 12.8 (L) 09/05/2020   HCT 36.9 (L) 09/05/2020   PLT 305.0 09/05/2020   GLUCOSE 145 (H) 09/05/2020   CHOL 154 09/05/2020   TRIG 141.0 09/05/2020   HDL 69.40 09/05/2020   LDLDIRECT 142.5 02/27/2014   LDLCALC 56 09/05/2020   ALT 16 09/05/2020   AST 18 09/05/2020   NA 136 09/05/2020   K 3.8 09/05/2020   CL 98 09/05/2020   CREATININE 0.68 09/05/2020   BUN 11 09/05/2020   CO2 29 09/05/2020   TSH 1.67 03/03/2020   PSA 0.75 03/03/2020   INR 1.0 09/15/2008   HGBA1C 7.6 (H) 09/05/2020   MICROALBUR 2.4 (H) 03/03/2020   Assessment/Plan:  Phillip [name] is a 75 y.o. White or Caucasian [1] male with  has a past medical history of Anemia, Anxiety, Arthritis, Bipolar depression ([place]), CAD, multiple vessel, Chronic pain disorder, Depression, Erectile dysfunction (01/17/2013), GERD (gastroesophageal reflux disease), History of colon polyps, Hyperlipidemia, Hypertension, Insomnia, Iron deficiency anemia (01/16/2013), Mixed hyperlipidemia (03/08/2013), Posterior neck pain (01/22/2019), Status post insertion of drug-eluting stent into left anterior descending (LAD) artery for coronary artery disease, Stented coronary artery (25/42/7062), Systolic murmur, and Unspecified vitamin D deficiency (01/17/2013).  Diabetes ([place]) Likely uncontrolled though asympt, pt encouraged to take all medication as prescribed, for lab f/u today  Lab Results   Component Value Date   HGBA1C 7.6 (H) 09/05/2020    Hyperlipidemia Lab Results  Component Value Date   LDLCALC 56 09/05/2020   Stable, pt to continue current statin - crestor   Hypertension BP Readings from Last 3 Encounters:  09/05/20 (!) 146/70  03/03/20 138/70  12/07/19 (!) 142/70   Uncontrolled today, pt to continue medical treatment altace, coreg, amloidpine as declines change in med tx, to check bp at home and call with resuts in 10 days, and next visit   Iron deficiency anemia No recent overt bleeding, also for lab f/u  Followup: Return in about 6 months (around 03/08/2021).  Cathlean Cower, MD 09/07/2020 7:39 AM [facility]

## 2020-09-06 ENCOUNTER — Encounter: Payer: Self-pay | Admitting: Internal Medicine

## 2020-09-07 ENCOUNTER — Encounter: Payer: Self-pay | Admitting: Internal Medicine

## 2020-09-07 NOTE — Assessment & Plan Note (Signed)
No recent overt bleeding, also for lab f/u

## 2020-09-07 NOTE — Assessment & Plan Note (Signed)
Lab Results  Component Value Date   LDLCALC 56 09/05/2020   Stable, pt to continue current statin - crestor

## 2020-09-07 NOTE — Assessment & Plan Note (Signed)
Likely uncontrolled though asympt, pt encouraged to take all medication as prescribed, for lab f/u today  Lab Results  Component Value Date   HGBA1C 7.6 (H) 09/05/2020

## 2020-09-07 NOTE — Assessment & Plan Note (Signed)
BP Readings from Last 3 Encounters:  09/05/20 (!) 146/70  03/03/20 138/70  12/07/19 (!) 142/70   Uncontrolled today, pt to continue medical treatment altace, coreg, amloidpine as declines change in med tx, to check bp at home and call with resuts in 10 days, and next visit

## 2020-09-08 ENCOUNTER — Encounter: Payer: Self-pay | Admitting: Internal Medicine

## 2020-09-22 ENCOUNTER — Other Ambulatory Visit: Payer: Self-pay | Admitting: Internal Medicine

## 2020-09-22 DIAGNOSIS — M0579 Rheumatoid arthritis with rheumatoid factor of multiple sites without organ or systems involvement: Secondary | ICD-10-CM | POA: Diagnosis not present

## 2020-09-22 DIAGNOSIS — M150 Primary generalized (osteo)arthritis: Secondary | ICD-10-CM | POA: Diagnosis not present

## 2020-09-22 DIAGNOSIS — R5382 Chronic fatigue, unspecified: Secondary | ICD-10-CM | POA: Diagnosis not present

## 2020-09-22 DIAGNOSIS — M2550 Pain in unspecified joint: Secondary | ICD-10-CM | POA: Diagnosis not present

## 2020-09-22 NOTE — Telephone Encounter (Signed)
Please refill as per office routine med refill policy (all routine meds refilled for 3 mo or monthly per pt preference up to one year from last visit, then month to month grace period for 3 mo, then further med refills will have to be denied)  

## 2020-10-01 ENCOUNTER — Other Ambulatory Visit: Payer: Self-pay | Admitting: Internal Medicine

## 2020-10-10 ENCOUNTER — Telehealth: Payer: Self-pay | Admitting: Internal Medicine

## 2020-10-10 NOTE — Telephone Encounter (Signed)
Brandy req recent labs for patient be faxed over to office so they can refill patient meds  Callback [phone] Fax [phone]

## 2020-10-10 NOTE — Telephone Encounter (Signed)
Fax sent.

## 2020-10-24 ENCOUNTER — Other Ambulatory Visit: Payer: Self-pay | Admitting: Internal Medicine

## 2020-11-12 ENCOUNTER — Other Ambulatory Visit: Payer: Self-pay | Admitting: Internal Medicine

## 2020-11-28 DIAGNOSIS — L820 Inflamed seborrheic keratosis: Secondary | ICD-10-CM | POA: Diagnosis not present

## 2020-11-28 DIAGNOSIS — D485 Neoplasm of uncertain behavior of skin: Secondary | ICD-10-CM | POA: Diagnosis not present

## 2020-11-28 DIAGNOSIS — L570 Actinic keratosis: Secondary | ICD-10-CM | POA: Diagnosis not present

## 2020-12-31 DIAGNOSIS — C44311 Basal cell carcinoma of skin of nose: Secondary | ICD-10-CM | POA: Diagnosis not present

## 2021-01-09 ENCOUNTER — Other Ambulatory Visit: Payer: Self-pay | Admitting: Internal Medicine

## 2021-01-30 DIAGNOSIS — C44311 Basal cell carcinoma of skin of nose: Secondary | ICD-10-CM | POA: Diagnosis not present

## 2021-01-30 DIAGNOSIS — L820 Inflamed seborrheic keratosis: Secondary | ICD-10-CM | POA: Diagnosis not present

## 2021-02-10 ENCOUNTER — Other Ambulatory Visit: Payer: Self-pay | Admitting: Cardiovascular Disease

## 2021-02-10 ENCOUNTER — Other Ambulatory Visit: Payer: Self-pay | Admitting: Internal Medicine

## 2021-02-25 ENCOUNTER — Telehealth: Payer: Self-pay | Admitting: Internal Medicine

## 2021-02-25 NOTE — Telephone Encounter (Signed)
LVM for pt to rtn my call to schedule AWV with [facility].  

## 2021-03-09 ENCOUNTER — Other Ambulatory Visit: Payer: Self-pay

## 2021-03-09 ENCOUNTER — Encounter: Payer: Self-pay | Admitting: Internal Medicine

## 2021-03-09 ENCOUNTER — Telehealth: Payer: Self-pay

## 2021-03-09 ENCOUNTER — Ambulatory Visit (INDEPENDENT_AMBULATORY_CARE_PROVIDER_SITE_OTHER): Payer: Medicare Other | Admitting: Internal Medicine

## 2021-03-09 ENCOUNTER — Other Ambulatory Visit: Payer: Self-pay | Admitting: Internal Medicine

## 2021-03-09 VITALS — BP 122/66 | HR 59 | Temp 98.0°F | Ht 70.0 in | Wt 169.0 lb

## 2021-03-09 DIAGNOSIS — E782 Mixed hyperlipidemia: Secondary | ICD-10-CM

## 2021-03-09 DIAGNOSIS — I10 Essential (primary) hypertension: Secondary | ICD-10-CM

## 2021-03-09 DIAGNOSIS — Z1211 Encounter for screening for malignant neoplasm of colon: Secondary | ICD-10-CM

## 2021-03-09 DIAGNOSIS — E538 Deficiency of other specified B group vitamins: Secondary | ICD-10-CM | POA: Diagnosis not present

## 2021-03-09 DIAGNOSIS — E1165 Type 2 diabetes mellitus with hyperglycemia: Secondary | ICD-10-CM | POA: Diagnosis not present

## 2021-03-09 DIAGNOSIS — E785 Hyperlipidemia, unspecified: Secondary | ICD-10-CM

## 2021-03-09 DIAGNOSIS — Z0001 Encounter for general adult medical examination with abnormal findings: Secondary | ICD-10-CM

## 2021-03-09 DIAGNOSIS — E559 Vitamin D deficiency, unspecified: Secondary | ICD-10-CM

## 2021-03-09 DIAGNOSIS — Z23 Encounter for immunization: Secondary | ICD-10-CM

## 2021-03-09 DIAGNOSIS — N471 Phimosis: Secondary | ICD-10-CM

## 2021-03-09 LAB — URINALYSIS, ROUTINE W REFLEX MICROSCOPIC
Bilirubin Urine: NEGATIVE
Ketones, ur: NEGATIVE
Leukocytes,Ua: NEGATIVE
Nitrite: NEGATIVE
Specific Gravity, Urine: 1.01 (ref 1.000–1.030)
Total Protein, Urine: NEGATIVE
Urine Glucose: >=1000 — AB
Urobilinogen, UA: 0.2 (ref 0.0–1.0)
WBC, UA: NONE SEEN (ref 0–?)
pH: 6 (ref 5.0–8.0)

## 2021-03-09 LAB — HEPATIC FUNCTION PANEL
ALT: 14 U/L (ref 0–53)
AST: 15 U/L (ref 0–37)
Albumin: 4.3 g/dL (ref 3.5–5.2)
Alkaline Phosphatase: 47 U/L (ref 39–117)
Bilirubin, Direct: 0.1 mg/dL (ref 0.0–0.3)
Total Bilirubin: 0.3 mg/dL (ref 0.2–1.2)
Total Protein: 6.8 g/dL (ref 6.0–8.3)

## 2021-03-09 LAB — CBC WITH DIFFERENTIAL/PLATELET
Basophils Absolute: 0 10*3/uL (ref 0.0–0.1)
Basophils Relative: 0.7 % (ref 0.0–3.0)
Eosinophils Absolute: 0.1 10*3/uL (ref 0.0–0.7)
Eosinophils Relative: 1.8 % (ref 0.0–5.0)
HCT: 37.3 % — ABNORMAL LOW (ref 39.0–52.0)
Hemoglobin: 12.3 g/dL — ABNORMAL LOW (ref 13.0–17.0)
Lymphocytes Relative: 15.7 % (ref 12.0–46.0)
Lymphs Abs: 1 10*3/uL (ref 0.7–4.0)
MCHC: 33.1 g/dL (ref 30.0–36.0)
MCV: 89.6 fl (ref 78.0–100.0)
Monocytes Absolute: 0.5 10*3/uL (ref 0.1–1.0)
Monocytes Relative: 7.7 % (ref 3.0–12.0)
Neutro Abs: 4.8 10*3/uL (ref 1.4–7.7)
Neutrophils Relative %: 74.1 % (ref 43.0–77.0)
Platelets: 283 10*3/uL (ref 150.0–400.0)
RBC: 4.16 Mil/uL — ABNORMAL LOW (ref 4.22–5.81)
RDW: 13.1 % (ref 11.5–15.5)
WBC: 6.5 10*3/uL (ref 4.0–10.5)

## 2021-03-09 LAB — PSA: PSA: 0.58 ng/mL (ref 0.10–4.00)

## 2021-03-09 LAB — LIPID PANEL
Cholesterol: 153 mg/dL (ref 0–200)
HDL: 68.7 mg/dL (ref >39.00)
LDL Cholesterol: 55 mg/dL (ref 0–99)
NonHDL: 84.25
Total CHOL/HDL Ratio: 2
Triglycerides: 147 mg/dL (ref 0.0–149.0)
VLDL: 29.4 mg/dL (ref 0.0–40.0)

## 2021-03-09 LAB — TSH: TSH: 1.71 u[IU]/mL (ref 0.35–5.50)

## 2021-03-09 LAB — BASIC METABOLIC PANEL
BUN: 8 mg/dL (ref 6–23)
CO2: 31 mEq/L (ref 19–32)
Calcium: 9.3 mg/dL (ref 8.4–10.5)
Chloride: 95 mEq/L — ABNORMAL LOW (ref 96–112)
Creatinine, Ser: 0.72 mg/dL (ref 0.40–1.50)
GFR: 89.14 mL/min (ref >60.00)
Glucose, Bld: 270 mg/dL — ABNORMAL HIGH (ref 70–99)
Potassium: 4.1 mEq/L (ref 3.5–5.1)
Sodium: 133 mEq/L — ABNORMAL LOW (ref 135–145)

## 2021-03-09 LAB — MICROALBUMIN / CREATININE URINE RATIO
Creatinine,U: 39.3 mg/dL
Microalb Creat Ratio: 1.8 mg/g (ref 0.0–30.0)
Microalb, Ur: <0.7 mg/dL (ref 0.0–1.9)

## 2021-03-09 LAB — VITAMIN B12: Vitamin B-12: 351 pg/mL (ref 211–911)

## 2021-03-09 LAB — VITAMIN D 25 HYDROXY (VIT D DEFICIENCY, FRACTURES): VITD: >120 ng/mL (ref 30.00–100.00)

## 2021-03-09 LAB — HEMOGLOBIN A1C: Hgb A1c MFr Bld: 8.5 % — ABNORMAL HIGH (ref 4.6–6.5)

## 2021-03-09 MED ORDER — RAMIPRIL 5 MG PO CAPS: 5.0000 mg | ORAL_CAPSULE | Freq: Every day | ORAL | 3 refills | Status: DC

## 2021-03-09 MED ORDER — TAMSULOSIN HCL 0.4 MG PO CAPS: 0.4000 mg | ORAL_CAPSULE | Freq: Every day | ORAL | 3 refills | Status: DC

## 2021-03-09 MED ORDER — AMLODIPINE BESYLATE 5 MG PO TABS: ORAL_TABLET | ORAL | 3 refills | Status: DC

## 2021-03-09 MED ORDER — CARVEDILOL 12.5 MG PO TABS: 12.5000 mg | ORAL_TABLET | Freq: Two times a day (BID) | ORAL | 3 refills | Status: DC

## 2021-03-09 MED ORDER — TRAZODONE HCL 50 MG PO TABS: ORAL_TABLET | ORAL | 3 refills | Status: DC

## 2021-03-09 MED ORDER — CARBAMAZEPINE 200 MG PO TABS: 200.0000 mg | ORAL_TABLET | Freq: Two times a day (BID) | ORAL | 1 refills | Status: DC

## 2021-03-09 MED ORDER — ROSUVASTATIN CALCIUM 20 MG PO TABS: 20.0000 mg | ORAL_TABLET | Freq: Every day | ORAL | 3 refills | Status: DC

## 2021-03-09 MED ORDER — DAPAGLIFLOZIN PROPANEDIOL 5 MG PO TABS: 5.0000 mg | ORAL_TABLET | Freq: Every day | ORAL | 3 refills | Status: DC

## 2021-03-09 MED ORDER — SILDENAFIL CITRATE 20 MG PO TABS: ORAL_TABLET | ORAL | 2 refills | Status: AC

## 2021-03-09 MED ORDER — METFORMIN HCL ER 500 MG PO TB24: 500.0000 mg | ORAL_TABLET | Freq: Every day | ORAL | 3 refills | Status: DC

## 2021-03-09 MED ORDER — PANTOPRAZOLE SODIUM 40 MG PO TBEC
40.0000 mg | DELAYED_RELEASE_TABLET | Freq: Two times a day (BID) | ORAL | 3 refills | Status: DC
Start: 2021-03-09 — End: 2022-05-17

## 2021-03-09 NOTE — Telephone Encounter (Signed)
Critical Vitamin D is greater than 120.  Phillip [name] from the labs.

## 2021-03-09 NOTE — Patient Instructions (Signed)
You will be contacted regarding the referral for: colonoscopy  You will be contacted regarding the referral for: urology  Please continue all other medications as before, and refills have been done if requested.  Please have the pharmacy call with any other refills you may need.  Please continue your efforts at being more active, low cholesterol diet, and weight control.  You are otherwise up to date with prevention measures today.  Please keep your appointments with your specialists as you may have planned  Please go to the LAB at the blood drawing area for the tests to be done  You will be contacted by phone if any changes need to be made immediately.  Otherwise, you will receive a letter about your results with an explanation, but please check with MyChart first.  Please remember to sign up for MyChart if you have not done so, as this will be important to you in the future with finding out test results, communicating by private email, and scheduling acute appointments online when needed.  Please make an Appointment to return in 6 months, or sooner if needed

## 2021-03-09 NOTE — Assessment & Plan Note (Signed)
Lab Results  Component Value Date   LDLCALC 56 09/05/2020   Stable, pt to continue current statin crestor

## 2021-03-09 NOTE — Assessment & Plan Note (Addendum)
Lab Results  Component Value Date   HGBA1C 7.6 (H) 09/05/2020   uncontrolled, pt for f/u a1c, consider add second agent

## 2021-03-09 NOTE — Progress Notes (Signed)
Patient ID: Phillip [name], male   DOB: Nov 11, 1945, 76 y.o.   MRN: [id number]         Chief Complaint:: wellness exam and Follow-up  DM, phimosis htn, hld       HPI:  Phillip [name] is a 76 y.o. 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  Only taking 1 metformin due to fear of diarrhea. Pt denies chest pain, increased sob or doe, wheezing, orthopnea, PND, increased LE swelling, palpitations, dizziness or syncope.   Pt denies polydipsia, polyuria, or new focal neuro s/s.   Pt denies fever, wt loss, night sweats, loss of appetite, or other constitutional symptoms  Denies urinary symptoms such as dysuria, frequency, urgency, flank pain, hematuria or n/v, fever, chills but has wrosening phimosis with cracks and bleeding, mild for several months, worse with erections.  States only taking 1 metformin per day due to wary of diarrhea,   Wt Readings from Last 3 Encounters:  03/09/21 169 lb (76.7 kg)  09/05/20 169 lb (76.7 kg)  03/07/20 160 lb (72.6 kg)   BP Readings from Last 3 Encounters:  03/09/21 122/66  09/05/20 (!) 146/70  03/03/20 138/70   Immunization History  Administered Date(s) Administered   Fluad Quad(high Dose 65+) 12/08/2018, 03/03/2020, 03/09/2021   Influenza, High Dose Seasonal PF 01/16/2013, 01/24/2015, 11/11/2016, 11/17/2017   Influenza,inj,Quad PF,6+ Mos 02/27/2014, 12/02/2015   Pneumococcal Conjugate-13 06/01/2016   Pneumococcal Polysaccharide-23 11/17/2017   Zoster Recombinat (Shingrix) 12/08/2018, 05/28/2019   Health Maintenance Due  Topic Date Due   COLONOSCOPY (Pts 45-79yrs Insurance coverage will need to be confirmed)  02/23/2019      Past Medical History:  Diagnosis Date   Anemia    Anxiety    Arthritis    Bipolar depression ([place])    CAD, multiple vessel    Chronic pain disorder    chronic bilat shoulder pain and feet pain   Depression    Erectile  dysfunction 01/17/2013   GERD (gastroesophageal reflux disease)    History of colon polyps    Hyperlipidemia    Hypertension    Insomnia    Iron deficiency anemia 01/16/2013   Mixed hyperlipidemia 03/08/2013   Posterior neck pain 01/22/2019   Status post insertion of drug-eluting stent into left anterior descending (LAD) artery for coronary artery disease    Stented coronary artery 17/00/1749   X 2   Systolic murmur    Unspecified vitamin D deficiency 01/17/2013   Past Surgical History:  Procedure Laterality Date   CAROTID STENT  2008   CORONARY ANGIOPLASTY WITH STENT PLACEMENT  about 2014   current Cardiology: Dr Orene Desanctis   [place]      reports that he quit smoking about 19 years ago. His smoking use included cigarettes. He has a 20.00 pack-year smoking history. His smokeless tobacco use includes chew. He reports current alcohol use. He reports that he does not use drugs. family history includes Alzheimer's disease in his brother; Cancer in his maternal grandfather, maternal grandmother, and mother; Heart disease in his maternal grandmother, mother, and paternal grandfather; Hyperlipidemia in his maternal grandmother; Lung cancer in his father; Stroke in his maternal grandmother and paternal grandfather. Allergies  Allergen Reactions   Lipitor [Atorvastatin] Other (See Comments)    myalgia  Current Outpatient Medications on File Prior to Visit  Medication Sig Dispense Refill   ALPRAZolam (XANAX) 1 MG tablet TAKE 1 TABLET BY MOUTH TWICE DAILY AS NEEDED 60 tablet 2   aspirin 81 MG tablet Take 81 mg by mouth daily.     clobetasol cream (TEMOVATE) 0.05 % APPLY TOPICALLY TWICE DAILY AS NEEDED 60 g 1   Cyanocobalamin (VITAMIN B 12 PO) Take by mouth.     ferrous sulfate 325 (65 FE) MG EC tablet Take 1 tablet (325 mg total) by mouth 2 (two) times a day. 60 tablet 5   folic acid (FOLVITE) 1 MG tablet TAKE 1 TABLET BY MOUTH DAILY 90 tablet 0   hydroxychloroquine (PLAQUENIL) 200 MG  tablet TAKE 2 TABLETS BY MOUTH DAILY 60 tablet 5   hydrOXYzine (ATARAX/VISTARIL) 25 MG tablet TAKE 1 TABLET BY MOUTH 3 TIMES DAILY AS NEEDED 270 tablet 1   leflunomide (ARAVA) 20 MG tablet Take 10 mg by mouth 2 (two) times daily.      nitroGLYCERIN (NITROSTAT) 0.4 MG SL tablet Place 1 tablet (0.4 mg total) under the tongue every 5 (five) minutes as needed for chest pain. 25 tablet 3   Omega-3 Fatty Acids (FISH OIL PO) Take by mouth daily.     predniSONE (DELTASONE) 10 MG tablet TAKE 1 TABLET BY MOUTH DAILY 90 tablet 1   triamcinolone (NASACORT AQ) 55 MCG/ACT AERO nasal inhaler Place 2 sprays into the nose daily. 1 Inhaler 12   No current facility-administered medications on file prior to visit.        ROS:  All others reviewed and negative.  Objective        PE:  BP 122/66 (BP Location: Right Arm, Patient Position: Sitting, Cuff Size: Large)    Pulse (!) 59    Temp 98 F (36.7 C) (Oral)    Ht 5\' 10"  (1.778 m)    Wt 169 lb (76.7 kg)    SpO2 97%    BMI 24.25 kg/m                 Constitutional: Pt appears in NAD               HENT: Head: NCAT.                Right Ear: External ear normal.                 Left Ear: External ear normal.                Eyes: . Pupils are equal, round, and reactive to light. Conjunctivae and EOM are normal               Nose: without d/c or deformity               Neck: Neck supple. Gross normal ROM               Cardiovascular: Normal rate and regular rhythm.                 Pulmonary/Chest: Effort normal and breath sounds without rales or wheezing.                Abd:  Soft, NT, ND, + BS, no organomegaly               Neurological: Pt is alert. At baseline orientation, motor grossly intact               Skin: Skin  is warm. No rashes, no other new lesions, LE edema - none               Psychiatric: Pt behavior is normal without agitation   Micro: none  Cardiac tracings I have personally interpreted today:  none  Pertinent Radiological findings  (summarize): none   Lab Results  Component Value Date   WBC 6.5 03/09/2021   HGB 12.3 (L) 03/09/2021   HCT 37.3 (L) 03/09/2021   PLT 283.0 03/09/2021   GLUCOSE 270 (H) 03/09/2021   CHOL 153 03/09/2021   TRIG 147.0 03/09/2021   HDL 68.70 03/09/2021   LDLDIRECT 142.5 02/27/2014   LDLCALC 55 03/09/2021   ALT 14 03/09/2021   AST 15 03/09/2021   NA 133 (L) 03/09/2021   K 4.1 03/09/2021   CL 95 (L) 03/09/2021   CREATININE 0.72 03/09/2021   BUN 8 03/09/2021   CO2 31 03/09/2021   TSH 1.71 03/09/2021   PSA 0.58 03/09/2021   INR 1.0 09/15/2008   HGBA1C 8.5 (H) 03/09/2021   MICROALBUR <0.7 03/09/2021   Assessment/Plan:  Phillip [name] is a 76 y.o. White or Caucasian [1] male with  has a past medical history of Anemia, Anxiety, Arthritis, Bipolar depression ([place]), CAD, multiple vessel, Chronic pain disorder, Depression, Erectile dysfunction (01/17/2013), GERD (gastroesophageal reflux disease), History of colon polyps, Hyperlipidemia, Hypertension, Insomnia, Iron deficiency anemia (01/16/2013), Mixed hyperlipidemia (03/08/2013), Posterior neck pain (01/22/2019), Status post insertion of drug-eluting stent into left anterior descending (LAD) artery for coronary artery disease, Stented coronary artery (17/51/0258), Systolic murmur, and Unspecified vitamin D deficiency (01/17/2013).  Vitamin D deficiency Last vitamin D Lab Results  Component Value Date   VD25OH 49.19 09/05/2020   Stable, cont oral replacement   Hyperlipidemia Lab Results  Component Value Date   LDLCALC 56 09/05/2020   Stable, pt to continue current statin crestor   Diabetes [facility] Lab Results  Component Value Date   HGBA1C 7.6 (H) 09/05/2020   uncontrolled, pt for f/u a1c, consider add second agent   Encounter for well adult exam with abnormal findings Age and sex appropriate education and counseling updated with regular exercise and diet Referrals for preventative services - due for colonoscopy Immunizations  addressed - none needed Smoking counseling  - none needed Evidence for depression or other mood disorder - stable bipolar illness recent Most recent labs reviewed. I have personally reviewed and have noted: 1) the patient's medical and social history 2) The patient's current medications and supplements 3) The patient's height, weight, and BMI have been recorded in the chart   Hypertension BP Readings from Last 3 Encounters:  03/09/21 122/66  09/05/20 (!) 146/70  03/03/20 138/70   Stable, pt to continue medical treatment altace, coreg, norvasc   Phimosis With 3 mo worsening symptoms , for urology referral for definitive management  Followup: Return in about 6 months (around 09/06/2021).  Cathlean Cower, MD 03/14/2021 9:48 PM [facility]

## 2021-03-09 NOTE — Assessment & Plan Note (Signed)
Last vitamin D Lab Results  Component Value Date   VD25OH 49.19 09/05/2020   Stable, cont oral replacement

## 2021-03-14 ENCOUNTER — Encounter: Payer: Self-pay | Admitting: Internal Medicine

## 2021-03-14 NOTE — Assessment & Plan Note (Signed)
BP Readings from Last 3 Encounters:  03/09/21 122/66  09/05/20 (!) 146/70  03/03/20 138/70   Stable, pt to continue medical treatment altace, coreg, norvasc

## 2021-03-14 NOTE — Assessment & Plan Note (Signed)
With 3 mo worsening symptoms , for urology referral for definitive management

## 2021-03-14 NOTE — Assessment & Plan Note (Signed)
Age and sex appropriate education and counseling updated with regular exercise and diet Referrals for preventative services - due for colonoscopy Immunizations addressed - none needed Smoking counseling  - none needed Evidence for depression or other mood disorder - stable bipolar illness recent Most recent labs reviewed. I have personally reviewed and have noted: 1) the patient's medical and social history 2) The patient's current medications and supplements 3) The patient's height, weight, and BMI have been recorded in the chart

## 2021-03-16 DIAGNOSIS — E119 Type 2 diabetes mellitus without complications: Secondary | ICD-10-CM | POA: Diagnosis not present

## 2021-03-25 DIAGNOSIS — M2550 Pain in unspecified joint: Secondary | ICD-10-CM | POA: Diagnosis not present

## 2021-03-25 DIAGNOSIS — M150 Primary generalized (osteo)arthritis: Secondary | ICD-10-CM | POA: Diagnosis not present

## 2021-03-25 DIAGNOSIS — M0579 Rheumatoid arthritis with rheumatoid factor of multiple sites without organ or systems involvement: Secondary | ICD-10-CM | POA: Diagnosis not present

## 2021-03-25 DIAGNOSIS — R5382 Chronic fatigue, unspecified: Secondary | ICD-10-CM | POA: Diagnosis not present

## 2021-04-13 ENCOUNTER — Telehealth: Payer: Self-pay | Admitting: Internal Medicine

## 2021-04-13 DIAGNOSIS — N471 Phimosis: Secondary | ICD-10-CM | POA: Diagnosis not present

## 2021-04-13 NOTE — Telephone Encounter (Signed)
PT visits today and drops off forms to be signed and filled out by Dr.John and faxed to number at the bottom of the form. He also states he wasn't very familiar with the dentist office and wanted to know if Dr.John had heard of them before?  CB to PT: [phone] [facility] for dentist: [phone] Fax for dentist: [phone]

## 2021-04-15 NOTE — Telephone Encounter (Signed)
Ok for procedure  Form signed - to taylor  I had not previously heard of this dental office, so I dont really have an opinion

## 2021-04-15 NOTE — Telephone Encounter (Signed)
Left message for patient to call me back. Form faxed to dental office

## 2021-04-28 NOTE — Telephone Encounter (Signed)
Pt states dental office has not received forms ? ?Pt requesting forms re-faxed ? ?Fax [phone] ? ? ?

## 2021-04-30 NOTE — Telephone Encounter (Signed)
Paperwork refaxed and patient notified via voicemail ?

## 2021-05-15 ENCOUNTER — Other Ambulatory Visit: Payer: Self-pay | Admitting: Internal Medicine

## 2021-06-05 ENCOUNTER — Other Ambulatory Visit: Payer: Self-pay | Admitting: Internal Medicine

## 2021-06-29 ENCOUNTER — Encounter: Payer: Self-pay | Admitting: Internal Medicine

## 2021-06-29 ENCOUNTER — Ambulatory Visit (INDEPENDENT_AMBULATORY_CARE_PROVIDER_SITE_OTHER): Payer: Medicare Other | Admitting: Internal Medicine

## 2021-06-29 VITALS — BP 120/70 | HR 57 | Temp 97.7°F | Ht 70.0 in | Wt 162.0 lb

## 2021-06-29 DIAGNOSIS — I10 Essential (primary) hypertension: Secondary | ICD-10-CM | POA: Diagnosis not present

## 2021-06-29 DIAGNOSIS — E559 Vitamin D deficiency, unspecified: Secondary | ICD-10-CM

## 2021-06-29 DIAGNOSIS — E1165 Type 2 diabetes mellitus with hyperglycemia: Secondary | ICD-10-CM | POA: Diagnosis not present

## 2021-06-29 DIAGNOSIS — M069 Rheumatoid arthritis, unspecified: Secondary | ICD-10-CM

## 2021-06-29 MED ORDER — DAPAGLIFLOZIN PROPANEDIOL 10 MG PO TABS
10.0000 mg | ORAL_TABLET | Freq: Every day | ORAL | 3 refills | Status: DC
Start: 2021-06-29 — End: 2022-02-24

## 2021-06-29 NOTE — Patient Instructions (Addendum)
Ok to start the [place] 10 mg per day ? ?You will be contacted regarding the referral for: rheumatology as you reqeusted ? ?Please continue all other medications as before, and refills have been done if requested. ? ?Please have the pharmacy call with any other refills you may need ? ?Please keep your appointments with your specialists as you may have planned ? ?Please make an Appointment to return in 3 months, or sooner if needed ?

## 2021-06-29 NOTE — Progress Notes (Signed)
Patient ID: Phillip [name], male   DOB: May 09, 1945, 76 y.o.   MRN: [id number] ? ? ? ?    Chief Complaint: follow up DM, RA, htn, low vit d ? ?     HPI:  Phillip [name] is a 76 y.o. male here overall doing ok, Pt denies chest pain, increased sob or doe, wheezing, orthopnea, PND, increased LE swelling, palpitations, dizziness or syncope.   Pt denies polydipsia, polyuria, or new focal neuro s/s.    Pt denies fever, wt loss, night sweats, loss of appetite, or other constitutional symptoms  Has ongoing mild joint pain and stiffness in the am to the hands for several wks, asks for new rheum referral.  Never started farxiga 5 at last visit. ?      ?Wt Readings from Last 3 Encounters:  ?06/29/21 162 lb (73.5 kg)  ?03/09/21 169 lb (76.7 kg)  ?09/05/20 169 lb (76.7 kg)  ? ?BP Readings from Last 3 Encounters:  ?06/29/21 120/70  ?03/09/21 122/66  ?09/05/20 (!) 146/70  ? ?      ?Past Medical History:  ?Diagnosis Date  ? Anemia   ? Anxiety   ? Arthritis   ? Bipolar depression ([place])   ? CAD, multiple vessel   ? Chronic pain disorder   ? chronic bilat shoulder pain and feet pain  ? Depression   ? Erectile dysfunction 01/17/2013  ? GERD (gastroesophageal reflux disease)   ? History of colon polyps   ? Hyperlipidemia   ? Hypertension   ? Insomnia   ? Iron deficiency anemia 01/16/2013  ? Mixed hyperlipidemia 03/08/2013  ? Posterior neck pain 01/22/2019  ? Status post insertion of drug-eluting stent into left anterior descending (LAD) artery for coronary artery disease   ? Stented coronary artery 01/16/2013  ? X 2  ? Systolic murmur   ? Unspecified vitamin D deficiency 01/17/2013  ? ?Past Surgical History:  ?Procedure Laterality Date  ? CAROTID STENT  2008  ? CORONARY ANGIOPLASTY WITH STENT PLACEMENT  about 2014  ? current Cardiology: Dr Orene Desanctis  ? EYE SURGERY    ? ? reports that he quit smoking about 19 years ago. His smoking use included cigarettes. He has a 20.00 pack-year smoking history. His smokeless tobacco use includes chew. He  reports current alcohol use. He reports that he does not use drugs. ?family history includes Alzheimer's disease in his brother; Cancer in his maternal grandfather, maternal grandmother, and mother; Heart disease in his maternal grandmother, mother, and paternal grandfather; Hyperlipidemia in his maternal grandmother; Lung cancer in his father; Stroke in his maternal grandmother and paternal grandfather. ?Allergies  ?Allergen Reactions  ? Lipitor [Atorvastatin] Other (See Comments)  ?  myalgia  ? ?Current Outpatient Medications on File Prior to Visit  ?Medication Sig Dispense Refill  ? ALPRAZolam (XANAX) 1 MG tablet TAKE 1 TABLET BY MOUTH TWICE DAILY AS NEEDED 60 tablet 2  ? amLODipine (NORVASC) 5 MG tablet TAKE 1 TABLET BY MOUTH DAILY IN THE MORNING AND AT BEDTIME 180 tablet 3  ? aspirin 81 MG tablet Take 81 mg by mouth daily.    ? carbamazepine (TEGRETOL) 200 MG tablet Take 1 tablet (200 mg total) by mouth 2 (two) times daily. 180 tablet 1  ? carvedilol (COREG) 12.5 MG tablet Take 1 tablet (12.5 mg total) by mouth 2 (two) times daily with a meal. 90 tablet 3  ? clobetasol cream (TEMOVATE) 0.05 % APPLY TOPICALLY TWICE DAILY AS NEEDED 60 g 1  ? Cyanocobalamin (  VITAMIN B 12 PO) Take by mouth.    ? ferrous sulfate 325 (65 FE) MG EC tablet Take 1 tablet (325 mg total) by mouth 2 (two) times a day. 60 tablet 5  ? folic acid (FOLVITE) 1 MG tablet TAKE 1 TABLET BY MOUTH DAILY 90 tablet 0  ? hydroxychloroquine (PLAQUENIL) 200 MG tablet TAKE 2 TABLETS BY MOUTH DAILY 60 tablet 5  ? hydrOXYzine (ATARAX/VISTARIL) 25 MG tablet TAKE 1 TABLET BY MOUTH 3 TIMES DAILY AS NEEDED 270 tablet 1  ? leflunomide (ARAVA) 20 MG tablet Take 10 mg by mouth 2 (two) times daily.     ? metFORMIN (GLUCOPHAGE-XR) 500 MG 24 hr tablet Take 1 tablet (500 mg total) by mouth daily with breakfast. 90 tablet 3  ? nitroGLYCERIN (NITROSTAT) 0.4 MG SL tablet Place 1 tablet (0.4 mg total) under the tongue every 5 (five) minutes as needed for chest pain. 25  tablet 3  ? Omega-3 Fatty Acids (FISH OIL PO) Take by mouth daily.    ? pantoprazole (PROTONIX) 40 MG tablet Take 1 tablet (40 mg total) by mouth 2 (two) times daily. 180 tablet 3  ? predniSONE (DELTASONE) 10 MG tablet TAKE 1 TABLET BY MOUTH DAILY 90 tablet 1  ? ramipril (ALTACE) 5 MG capsule Take 1 capsule (5 mg total) by mouth daily. 90 capsule 3  ? rosuvastatin (CRESTOR) 20 MG tablet Take 1 tablet (20 mg total) by mouth daily. 90 tablet 3  ? sildenafil (REVATIO) 20 MG tablet Take 3 pills daily as needed 60 tablet 2  ? tamsulosin (FLOMAX) 0.4 MG CAPS capsule Take 1 capsule (0.4 mg total) by mouth daily. 90 capsule 3  ? traZODone (DESYREL) 50 MG tablet 1 tab by mouth at bedtime 90 tablet 3  ? triamcinolone (NASACORT AQ) 55 MCG/ACT AERO nasal inhaler Place 2 sprays into the nose daily. 1 Inhaler 12  ? oxyCODONE-acetaminophen (PERCOCET/ROXICET) 5-325 MG tablet Take 1 tablet by mouth 4 (four) times daily as needed. (Patient not taking: Reported on 06/29/2021)    ? ?No current facility-administered medications on file prior to visit.  ? ?     ROS:  All others reviewed and negative. ? ?Objective  ? ?     PE:  BP 120/70 (BP Location: Left Arm, Patient Position: Sitting, Cuff Size: Large)   Pulse (!) 57   Temp 97.7 ?F (36.5 ?C) (Oral)   Ht '5\' 10"'$  (1.778 m)   Wt 162 lb (73.5 kg)   SpO2 96%   BMI 23.24 kg/m?  ? ?              Constitutional: Pt appears in NAD ?              HENT: Head: NCAT.  ?              Right Ear: External ear normal.   ?              Left Ear: External ear normal.  ?              Eyes: . Pupils are equal, round, and reactive to light. Conjunctivae and EOM are normal ?              Nose: without d/c or deformity ?              Neck: Neck supple. Gross normal ROM ?              Cardiovascular: Normal rate and regular rhythm.   ?  Pulmonary/Chest: Effort normal and breath sounds without rales or wheezing.  ?              Abd:  Soft, NT, ND, + BS, no organomegaly ?               Neurological: Pt is alert. At baseline orientation, motor grossly intact ?              Skin: Skin is warm. No rashes, no other new lesions, LE edema - none ?              Psychiatric: Pt behavior is normal without agitation  ? ?Micro: none ? ?Cardiac tracings I have personally interpreted today:  none ? ?Pertinent Radiological findings (summarize): none  ? ?Lab Results  ?Component Value Date  ? WBC 6.5 03/09/2021  ? HGB 12.3 (L) 03/09/2021  ? HCT 37.3 (L) 03/09/2021  ? PLT 283.0 03/09/2021  ? GLUCOSE 270 (H) 03/09/2021  ? CHOL 153 03/09/2021  ? TRIG 147.0 03/09/2021  ? HDL 68.70 03/09/2021  ? LDLDIRECT 142.5 02/27/2014  ? [place] 55 03/09/2021  ? ALT 14 03/09/2021  ? AST 15 03/09/2021  ? NA 133 (L) 03/09/2021  ? K 4.1 03/09/2021  ? CL 95 (L) 03/09/2021  ? CREATININE 0.72 03/09/2021  ? BUN 8 03/09/2021  ? CO2 31 03/09/2021  ? TSH 1.71 03/09/2021  ? PSA 0.58 03/09/2021  ? INR 1.0 09/15/2008  ? HGBA1C 8.5 (H) 03/09/2021  ? MICROALBUR <0.7 03/09/2021  ? ?Assessment/Plan:  ?SUHEYB RAUCCI is a 76 y.o. White or Caucasian [1] male with  has a past medical history of Anemia, Anxiety, Arthritis, Bipolar depression ([place]), CAD, multiple vessel, Chronic pain disorder, Depression, Erectile dysfunction (01/17/2013), GERD (gastroesophageal reflux disease), History of colon polyps, Hyperlipidemia, Hypertension, Insomnia, Iron deficiency anemia (01/16/2013), Mixed hyperlipidemia (03/08/2013), Posterior neck pain (01/22/2019), Status post insertion of drug-eluting stent into left anterior descending (LAD) artery for coronary artery disease, Stented coronary artery (09/02/1973), Systolic murmur, and Unspecified vitamin D deficiency (01/17/2013). ? ?Hypertension ?BP Readings from Last 3 Encounters:  ?06/29/21 120/70  ?03/09/21 122/66  ?09/05/20 (!) 146/70  ? ?Stable, pt to continue medical treatment norvasc, coreg ? ? ?Rheumatoid arthritis ([place]) ?With ongoing possibly mild worsening symptoms - for rheum referral ? ?Vitamin D  deficiency ?Last vitamin D ?Lab Results  ?Component Value Date  ? VD25OH >120.00 (HH) 03/09/2021  ? ?Stable, cont oral replacement ? ? ?Diabetes ([place]) ?Lab Results  ?Component Value Date  ? HGBA1C 8.5 (H) 03/09/2021  ? ?uncontrolle

## 2021-07-02 ENCOUNTER — Encounter: Payer: Self-pay | Admitting: Internal Medicine

## 2021-07-02 NOTE — Assessment & Plan Note (Signed)
Last vitamin D ?Lab Results  ?Component Value Date  ? VD25OH >120.00 (HH) 03/09/2021  ? ?Stable, cont oral replacement ? ?

## 2021-07-02 NOTE — Assessment & Plan Note (Signed)
With ongoing possibly mild worsening symptoms - for rheum referral ?

## 2021-07-02 NOTE — Assessment & Plan Note (Signed)
Lab Results  ?Component Value Date  ? HGBA1C 8.5 (H) 03/09/2021  ? ?uncontrolled, pt to start farxiga 10 qd, ? ?

## 2021-07-02 NOTE — Assessment & Plan Note (Signed)
BP Readings from Last 3 Encounters:  ?06/29/21 120/70  ?03/09/21 122/66  ?09/05/20 (!) 146/70  ? ?Stable, pt to continue medical treatment norvasc, coreg ? ?

## 2021-07-08 ENCOUNTER — Telehealth: Payer: Self-pay | Admitting: Internal Medicine

## 2021-07-08 DIAGNOSIS — M069 Rheumatoid arthritis, unspecified: Secondary | ICD-10-CM

## 2021-07-08 NOTE — Telephone Encounter (Signed)
Pt. Has called in about his referral for rheumatology.  ? ?It was declined. Please send elsewhere.  ? ?Requesting refill on hydroxychloroquine (PLAQUENIL) 200 MG tablet while he waits for new referal.  ? ?[facility] [place] - [address] Phone:  [phone]  ?Fax:  [phone]  ?  ? ?

## 2021-07-09 NOTE — Telephone Encounter (Signed)
Ok referral done again

## 2021-07-16 DIAGNOSIS — M25512 Pain in left shoulder: Secondary | ICD-10-CM | POA: Diagnosis not present

## 2021-07-16 DIAGNOSIS — M2550 Pain in unspecified joint: Secondary | ICD-10-CM | POA: Diagnosis not present

## 2021-07-16 DIAGNOSIS — Z79899 Other long term (current) drug therapy: Secondary | ICD-10-CM | POA: Diagnosis not present

## 2021-07-16 DIAGNOSIS — M79672 Pain in left foot: Secondary | ICD-10-CM | POA: Diagnosis not present

## 2021-07-16 DIAGNOSIS — M79671 Pain in right foot: Secondary | ICD-10-CM | POA: Diagnosis not present

## 2021-07-16 DIAGNOSIS — M0579 Rheumatoid arthritis with rheumatoid factor of multiple sites without organ or systems involvement: Secondary | ICD-10-CM | POA: Diagnosis not present

## 2021-07-16 DIAGNOSIS — M7582 Other shoulder lesions, left shoulder: Secondary | ICD-10-CM | POA: Diagnosis not present

## 2021-08-05 ENCOUNTER — Other Ambulatory Visit: Payer: Self-pay | Admitting: Internal Medicine

## 2021-08-06 ENCOUNTER — Telehealth: Payer: Self-pay | Admitting: Internal Medicine

## 2021-08-06 ENCOUNTER — Other Ambulatory Visit: Payer: Self-pay | Admitting: Urology

## 2021-08-06 DIAGNOSIS — N471 Phimosis: Secondary | ICD-10-CM | POA: Diagnosis not present

## 2021-08-06 DIAGNOSIS — N481 Balanitis: Secondary | ICD-10-CM | POA: Diagnosis not present

## 2021-08-31 ENCOUNTER — Encounter (HOSPITAL_COMMUNITY): Admission: RE | Admit: 2021-08-31 | Payer: Medicare Other | Source: Ambulatory Visit

## 2021-09-08 ENCOUNTER — Ambulatory Visit: Payer: Medicare Other | Admitting: Internal Medicine

## 2021-09-14 ENCOUNTER — Other Ambulatory Visit (INDEPENDENT_AMBULATORY_CARE_PROVIDER_SITE_OTHER): Payer: Medicare Other

## 2021-09-14 ENCOUNTER — Ambulatory Visit: Payer: Medicare Other | Admitting: Internal Medicine

## 2021-09-14 ENCOUNTER — Other Ambulatory Visit: Payer: Self-pay | Admitting: Internal Medicine

## 2021-09-14 DIAGNOSIS — E538 Deficiency of other specified B group vitamins: Secondary | ICD-10-CM | POA: Diagnosis not present

## 2021-09-14 DIAGNOSIS — E559 Vitamin D deficiency, unspecified: Secondary | ICD-10-CM

## 2021-09-14 DIAGNOSIS — E1165 Type 2 diabetes mellitus with hyperglycemia: Secondary | ICD-10-CM

## 2021-09-14 LAB — HEPATIC FUNCTION PANEL
ALT: 17 U/L (ref 0–53)
AST: 19 U/L (ref 0–37)
Albumin: 4.2 g/dL (ref 3.5–5.2)
Alkaline Phosphatase: 60 U/L (ref 39–117)
Bilirubin, Direct: 0 mg/dL (ref 0.0–0.3)
Total Bilirubin: 0.3 mg/dL (ref 0.2–1.2)
Total Protein: 6.9 g/dL (ref 6.0–8.3)

## 2021-09-14 LAB — BASIC METABOLIC PANEL
BUN: 7 mg/dL (ref 6–23)
CO2: 29 mEq/L (ref 19–32)
Calcium: 9.3 mg/dL (ref 8.4–10.5)
Chloride: 95 mEq/L — ABNORMAL LOW (ref 96–112)
Creatinine, Ser: 0.6 mg/dL (ref 0.40–1.50)
GFR: 93.84 mL/min (ref >60.00)
Glucose, Bld: 100 mg/dL — ABNORMAL HIGH (ref 70–99)
Potassium: 4.2 mEq/L (ref 3.5–5.1)
Sodium: 133 mEq/L — ABNORMAL LOW (ref 135–145)

## 2021-09-14 LAB — LIPID PANEL
Cholesterol: 146 mg/dL (ref 0–200)
HDL: 63.7 mg/dL (ref >39.00)
LDL Cholesterol: 58 mg/dL (ref 0–99)
NonHDL: 82.59
Total CHOL/HDL Ratio: 2
Triglycerides: 124 mg/dL (ref 0.0–149.0)
VLDL: 24.8 mg/dL (ref 0.0–40.0)

## 2021-09-14 LAB — VITAMIN D 25 HYDROXY (VIT D DEFICIENCY, FRACTURES): VITD: 66.42 ng/mL (ref 30.00–100.00)

## 2021-09-14 LAB — HEMOGLOBIN A1C: Hgb A1c MFr Bld: 7.1 % — ABNORMAL HIGH (ref 4.6–6.5)

## 2021-09-14 LAB — VITAMIN B12: Vitamin B-12: 301 pg/mL (ref 211–911)

## 2021-09-23 NOTE — Telephone Encounter (Signed)
error 

## 2021-09-24 NOTE — Progress Notes (Addendum)
COVID Vaccine Completed: yes  Date of COVID positive in last 90 days: no  PCP - Cathlean Cower, MD Cardiologist - Joya Martyr, MD  Chest x-ray - n/a EKG - 09/25/21 Epic/chart Stress Test - 06/21/17 Epic ECHO - 08/14/18 Epic Cardiac Cath - 2008 Pacemaker/ICD device last checked: n/a Spinal Cord Stimulator: n/a  Bowel Prep - no  Sleep Study - n/a CPAP -   Fasting Blood Sugar - no check at home Checks Blood Sugar  times pt day  Blood Thinner Instructions: ASA 81, hold 5 days Aspirin Instructions: Last Dose:  Activity level: Can go up a flight of stairs and perform activities of daily living without stopping and without symptoms of chest pain or shortness of breath.   Anesthesia review: HTN, CAD with stent x2, DM2  Patient denies shortness of breath, fever, cough and chest pain at PAT appointment  Patient verbalized understanding of instructions that were given to them at the PAT appointment. Patient was also instructed that they will need to review over the PAT instructions again at home before surgery.

## 2021-09-24 NOTE — Patient Instructions (Addendum)
SURGICAL WAITING ROOM VISITATION Patients having surgery or a procedure may have no more than 2 support people in the waiting area - these visitors may rotate.   Children under the age of 39 must have an adult with them who is not the patient. If the patient needs to stay at the hospital during part of their recovery, the visitor guidelines for inpatient rooms apply. Pre-op nurse will coordinate an appropriate time for 1 support person to accompany patient in pre-op.  This support person may not rotate.    Please refer to the [facility] website for the visitor guidelines for Inpatients (after your surgery is over and you are in a regular room).    Your procedure is scheduled on: 09/30/21   Report to [facility] Main Entrance    Report to admitting at 6:45 AM   Call this number if you have problems the morning of surgery [phone]   Do not eat food :After Midnight.   After Midnight you may have the following liquids until 6:00 AM DAY OF SURGERY  Water Non-Citrus Juices (without pulp, NO RED) Carbonated Beverages Black Coffee (NO MILK/CREAM OR CREAMERS, sugar ok)  Clear Tea (NO MILK/CREAM OR CREAMERS, sugar ok) regular and decaf                             Plain Jell-O (NO RED)                                           Fruit ices (not with fruit pulp, NO RED)                                     Popsicles (NO RED)                                                               Sports drinks like Gatorade (NO RED  FOLLOW BOWEL PREP AND ANY ADDITIONAL PRE OP INSTRUCTIONS YOU RECEIVED FROM YOUR SURGEON'S OFFICE!!!     Oral Hygiene is also important to reduce your risk of infection.                                    Remember - BRUSH YOUR TEETH THE MORNING OF SURGERY WITH YOUR REGULAR TOOTHPASTE   Take these medicines the morning of surgery with A SIP OF WATER: Xanax, Amlodipine, Carbamazepine, Carvedilol, Pantoprazole, Rosuvastatin, Flomax  How to Manage Your Diabetes Before  and After Surgery  Why is it important to control my blood sugar before and after surgery? Improving blood sugar levels before and after surgery helps healing and can limit problems. A way of improving blood sugar control is eating a healthy diet by:  Eating less sugar and carbohydrates  Increasing activity/exercise  Talking with your doctor about reaching your blood sugar goals High blood sugars (greater than 180 mg/dL) can raise your risk of infections and slow your recovery, so you will need to focus on controlling your diabetes  during the weeks before surgery. Make sure that the doctor who takes care of your diabetes knows about your planned surgery including the date and location.  How do I manage my blood sugar before surgery? Check your blood sugar at least 4 times a day, starting 2 days before surgery, to make sure that the level is not too high or low. Check your blood sugar the morning of your surgery when you wake up and every 2 hours until you get to the Short Stay unit. If your blood sugar is less than 70 mg/dL, you will need to treat for low blood sugar: Do not take insulin. Treat a low blood sugar (less than 70 mg/dL) with  cup of clear juice (cranberry or apple), 4 glucose tablets, OR glucose gel. Recheck blood sugar in 15 minutes after treatment (to make sure it is greater than 70 mg/dL). If your blood sugar is not greater than 70 mg/dL on recheck, call [phone] for further instructions. Report your blood sugar to the short stay nurse when you get to Short Stay.  If you are admitted to the hospital after surgery: Your blood sugar will be checked by the staff and you will probably be given insulin after surgery (instead of oral diabetes medicines) to make sure you have good blood sugar levels. The goal for blood sugar control after surgery is 80-180 mg/dL.   WHAT DO I DO ABOUT MY DIABETES MEDICATION?  Do not take oral diabetes medicines (pills) the morning of  surgery.  HOLD FARXIGA 3 DAYS before surgery. Last dose 09/26/21  THE DAY BEFORE SURGERY, take Metformin as prescribed      THE MORNING OF SURGERY, do not take Metformin or [place]  Reviewed and Endorsed by [facility] Patient Education Committee, August 2015                               You may not have any metal on your body including jewelry, and body piercing             Do not wear lotions, powders, cologne, or deodorant              Men may shave face and neck.   Do not bring valuables to the hospital. [place].   Contacts, dentures or bridgework may not be worn into surgery.  DO NOT [place]. PHARMACY WILL DISPENSE MEDICATIONS LISTED ON YOUR MEDICATION LIST TO YOU DURING YOUR ADMISSION [place]!    Patients discharged on the day of surgery will not be allowed to drive home.  Someone NEEDS to stay with you for the first 24 hours after anesthesia.              Please read over the following fact sheets you were given: IF YOU HAVE QUESTIONS ABOUT YOUR PRE-OP INSTRUCTIONS PLEASE CALL [facility] - Preparing for Surgery Before surgery, you can play an important role.  Because skin is not sterile, your skin needs to be as free of germs as possible.  You can reduce the number of germs on your skin by washing with CHG (chlorahexidine gluconate) soap before surgery.  CHG is an antiseptic cleaner which kills germs and bonds with the skin to continue killing germs even after washing. Please  DO NOT use if you have an allergy to CHG or antibacterial soaps.  If your skin becomes reddened/irritated stop using the CHG and inform your nurse when you arrive at Short Stay. Do not shave (including legs and underarms) for at least 48 hours prior to the first CHG shower.  You may shave your face/neck.  Please follow these instructions carefully:  1.  Shower with CHG Soap the night  before surgery and the  morning of surgery.  2.  If you choose to wash your hair, wash your hair first as usual with your normal  shampoo.  3.  After you shampoo, rinse your hair and body thoroughly to remove the shampoo.                             4.  Use CHG as you would any other liquid soap.  You can apply chg directly to the skin and wash.  Gently with a scrungie or clean washcloth.  5.  Apply the CHG Soap to your body ONLY FROM THE NECK DOWN.   Do   not use on face/ open                           Wound or open sores. Avoid contact with eyes, ears mouth and   genitals (private parts).                       Wash face,  Genitals (private parts) with your normal soap.             6.  Wash thoroughly, paying special attention to the area where your    surgery  will be performed.  7.  Thoroughly rinse your body with warm water from the neck down.  8.  DO NOT shower/wash with your normal soap after using and rinsing off the CHG Soap.                9.  Pat yourself dry with a clean towel.            10.  Wear clean pajamas.            11.  Place clean sheets on your bed the night of your first shower and do not  sleep with pets. Day of Surgery : Do not apply any lotions/deodorants the morning of surgery.  Please wear clean clothes to the hospital/surgery center.  FAILURE TO FOLLOW THESE INSTRUCTIONS MAY RESULT IN THE CANCELLATION OF YOUR SURGERY  PATIENT SIGNATURE_________________________________  NURSE SIGNATURE__________________________________  ________________________________________________________________________

## 2021-09-25 ENCOUNTER — Encounter (HOSPITAL_COMMUNITY)
Admission: RE | Admit: 2021-09-25 | Discharge: 2021-09-25 | Disposition: A | Discharge status: Home / Self Care | Payer: Medicare Other | Source: Ambulatory Visit | Attending: Urology | Admitting: Urology

## 2021-09-25 ENCOUNTER — Encounter (HOSPITAL_COMMUNITY): Payer: Self-pay

## 2021-09-25 VITALS — BP 147/67 | HR 56 | Temp 97.7°F | Resp 14 | Ht 69.0 in | Wt 151.8 lb

## 2021-09-25 DIAGNOSIS — N471 Phimosis: Secondary | ICD-10-CM | POA: Diagnosis not present

## 2021-09-25 DIAGNOSIS — E1165 Type 2 diabetes mellitus with hyperglycemia: Secondary | ICD-10-CM

## 2021-09-25 DIAGNOSIS — I2510 Atherosclerotic heart disease of native coronary artery without angina pectoris: Secondary | ICD-10-CM | POA: Insufficient documentation

## 2021-09-25 DIAGNOSIS — E119 Type 2 diabetes mellitus without complications: Secondary | ICD-10-CM | POA: Diagnosis not present

## 2021-09-25 DIAGNOSIS — I10 Essential (primary) hypertension: Secondary | ICD-10-CM | POA: Insufficient documentation

## 2021-09-25 DIAGNOSIS — Z01818 Encounter for other preprocedural examination: Secondary | ICD-10-CM | POA: Diagnosis not present

## 2021-09-25 DIAGNOSIS — F1010 Alcohol abuse, uncomplicated: Secondary | ICD-10-CM

## 2021-09-25 HISTORY — DX: Malignant (primary) neoplasm, unspecified: C80.1

## 2021-09-25 HISTORY — DX: Type 2 diabetes mellitus without complications: E11.9

## 2021-09-25 LAB — COMPREHENSIVE METABOLIC PANEL
ALT: 19 U/L (ref 0–44)
AST: 21 U/L (ref 15–41)
Albumin: 3.9 g/dL (ref 3.5–5.0)
Alkaline Phosphatase: 57 U/L (ref 38–126)
Anion gap: 8 (ref 5–15)
BUN: 12 mg/dL (ref 8–23)
CO2: 27 mmol/L (ref 22–32)
Calcium: 9.1 mg/dL (ref 8.9–10.3)
Chloride: 95 mmol/L — ABNORMAL LOW (ref 98–111)
Creatinine, Ser: 0.67 mg/dL (ref 0.61–1.24)
GFR, Estimated: >60 mL/min (ref >60)
Glucose, Bld: 163 mg/dL — ABNORMAL HIGH (ref 70–99)
Potassium: 4.3 mmol/L (ref 3.5–5.1)
Sodium: 130 mmol/L — ABNORMAL LOW (ref 135–145)
Total Bilirubin: 0.4 mg/dL (ref 0.3–1.2)
Total Protein: 7.4 g/dL (ref 6.5–8.1)

## 2021-09-25 LAB — CBC
HCT: 39.9 % (ref 39.0–52.0)
Hemoglobin: 13 g/dL (ref 13.0–17.0)
MCH: 29.7 pg (ref 26.0–34.0)
MCHC: 32.6 g/dL (ref 30.0–36.0)
MCV: 91.1 fL (ref 80.0–100.0)
Platelets: 328 10*3/uL (ref 150–400)
RBC: 4.38 MIL/uL (ref 4.22–5.81)
RDW: 12 % (ref 11.5–15.5)
WBC: 8.9 10*3/uL (ref 4.0–10.5)
nRBC: 0 % (ref 0.0–0.2)

## 2021-09-25 LAB — GLUCOSE, CAPILLARY: Glucose-Capillary: 202 mg/dL — ABNORMAL HIGH (ref 70–99)

## 2021-09-28 ENCOUNTER — Ambulatory Visit: Payer: Medicare Other | Admitting: Internal Medicine

## 2021-09-28 ENCOUNTER — Encounter (HOSPITAL_COMMUNITY): Payer: Self-pay

## 2021-09-28 NOTE — Anesthesia Preprocedure Evaluation (Signed)
Anesthesia Evaluation  Patient identified by MRN, date of birth, ID band Patient awake    Reviewed: Allergy & Precautions, NPO status , Patient's Chart, lab work & pertinent test results, reviewed documented beta blocker date and time   Airway Mallampati: III  TM Distance: >3 FB Neck ROM: Full    Dental  (+) Dental Advisory Given, Edentulous Upper, Edentulous Lower   Pulmonary former smoker,    Pulmonary exam normal breath sounds clear to auscultation       Cardiovascular hypertension, Pt. on home beta blockers and Pt. on medications + angina + CAD and + Cardiac Stents  Normal cardiovascular exam Rhythm:Regular Rate:Normal     Neuro/Psych PSYCHIATRIC DISORDERS Anxiety Depression Bipolar Disorder negative neurological ROS     GI/Hepatic Neg liver ROS, GERD  ,  Endo/Other  diabetes, Type 2, Oral Hypoglycemic Agents  Renal/GU negative Renal ROS   BALANTIS AND PHIMOSIS    Musculoskeletal  (+) Arthritis ,   Abdominal   Peds  Hematology negative hematology ROS (+)   Anesthesia Other Findings   Reproductive/Obstetrics                           Anesthesia Physical Anesthesia Plan  ASA: 3  Anesthesia Plan: General   Post-op Pain Management: Tylenol PO (pre-op)*   Induction: Intravenous  PONV Risk Score and Plan: 3 and Dexamethasone and Ondansetron  Airway Management Planned: LMA  Additional Equipment:   Intra-op Plan:   Post-operative Plan: Extubation in OR  Informed Consent: I have reviewed the patients History and Physical, chart, labs and discussed the procedure including the risks, benefits and alternatives for the proposed anesthesia with the patient or authorized representative who has indicated his/her understanding and acceptance.     Dental advisory given  Plan Discussed with: CRNA  Anesthesia Plan Comments: (See APP note by Durel Salts, FNP )      Anesthesia Quick  Evaluation

## 2021-09-28 NOTE — Progress Notes (Signed)
Anesthesia Chart Review:   Case: [id number] Date/Time: 09/30/21 0845   Procedure: CIRCUMCISION ADULT   Anesthesia type: General   Pre-op diagnosis: Elberta Spaniel AND PHIMOSIS   Location: [facility] / [facility]   Surgeons: Vira Agar, MD       DISCUSSION: Pt is 76 years old with hx CAD (stent DES to RI 2008, DES to OM1 2010), HTN, DM, heart murmur, pulmonary nodules (benign per radiology report on 2018 CT)  Per RN notes, pt can go up a flight of stairs and perform activities of daily living without stopping and without symptoms of chest pain or shortness of breath.  Reviewed case with Dr. Christella Hartigan.    VS: BP (!) 147/67   Pulse (!) 56   Temp 36.5 C (Oral)   Resp 14   Ht '5\' 9"'$  (1.753 m)   Wt 68.9 kg   SpO2 98%   BMI 22.42 kg/m   PROVIDERS: - PCP is Biagio Borg, MD - Cardiologist is Sanda Klein, MD. Last office visit 11/30/19 with Coletta Memos, NP   LABS: Labs reviewed: Acceptable for surgery. - HbA1c was 7.1 on 09/14/21   (all labs ordered are listed, but only abnormal results are displayed)  Labs Reviewed  COMPREHENSIVE METABOLIC PANEL - Abnormal; Notable for the following components:      Result Value   Sodium 130 (*)    Chloride 95 (*)    Glucose, Bld 163 (*)    All other components within normal limits  GLUCOSE, CAPILLARY - Abnormal; Notable for the following components:   Glucose-Capillary 202 (*)    All other components within normal limits  CBC     EKG 09/25/21: Sinus bradycardia. Nonspecific ST abnormality   CV: Echo 08/14/18:  1. The left ventricle has normal systolic function with an ejection fraction of 60-65%. The cavity size was normal. There is moderately increased left ventricular wall thickness. Left ventricular diastolic parameters were normal.  2. The right ventricle has normal systolic function. The cavity was normal. There is no increase in right ventricular wall thickness.  3. The tricuspid valve is grossly normal.  4. The aortic  valve is tricuspid. Mild thickening of the aortic valve. Mild calcification of the aortic valve.   Nuclear stress test 06/21/17:  The left ventricular ejection fraction is mildly decreased (45-54%). Nuclear stress EF: 45%. There was no ST segment deviation noted during stress. Findings consistent with prior myocardial infarction. This is an intermediate risk study.    Cardiac cath 06/18/08:  - LM: 40% osital lesion - LAD: 60% proximal lesion, 60% midsegment lesion; D1 and D2 without significant disease - RI: proximal stent, 40% in-stent restenosis in proximal segment of stent, 20% midsegment lesion - CX: proximal 80% lesion in OM1; s/p DES to OM1 - RCA: proximal 40% lesion and midsegment 40% lesion.    Past Medical History:  Diagnosis Date   Anemia    Anxiety    Arthritis    Bipolar depression ([place])    CAD, multiple vessel    Cancer ([place])    skin, basal   Chronic pain disorder    chronic bilat shoulder pain and feet pain   Depression    Diabetes mellitus without complication ([place])    Erectile dysfunction 01/17/2013   GERD (gastroesophageal reflux disease)    History of colon polyps    Hyperlipidemia    Hypertension    Insomnia    Iron deficiency anemia 01/16/2013   Mixed hyperlipidemia 03/08/2013   Posterior  neck pain 01/22/2019   Status post insertion of drug-eluting stent into left anterior descending (LAD) artery for coronary artery disease    Stented coronary artery    X 2   Systolic murmur    Unspecified vitamin D deficiency 01/17/2013    Past Surgical History:  Procedure Laterality Date   CAROTID STENT  2008   CORONARY ANGIOPLASTY WITH STENT PLACEMENT  about 2014   current Cardiology: Dr Orene Desanctis   EYE SURGERY      MEDICATIONS:  ALPRAZolam (XANAX) 1 MG tablet   amLODipine (NORVASC) 5 MG tablet   aspirin 81 MG tablet   carbamazepine (TEGRETOL) 200 MG tablet   carvedilol (COREG) 12.5 MG tablet   clobetasol cream (TEMOVATE) 0.05 %   Cyanocobalamin  (VITAMIN B 12 PO)   dapagliflozin propanediol (FARXIGA) 10 MG TABS tablet   ferrous sulfate 325 (65 FE) MG EC tablet   folic acid (FOLVITE) 1 MG tablet   HYDROcodone-acetaminophen (NORCO/VICODIN) 5-325 MG tablet   hydroxychloroquine (PLAQUENIL) 200 MG tablet   hydrOXYzine (ATARAX/VISTARIL) 25 MG tablet   ibuprofen (ADVIL) 200 MG tablet   leflunomide (ARAVA) 20 MG tablet   metFORMIN (GLUCOPHAGE-XR) 500 MG 24 hr tablet   nitroGLYCERIN (NITROSTAT) 0.4 MG SL tablet   Omega-3 Fatty Acids (FISH OIL PO)   OVER THE COUNTER MEDICATION   oxyCODONE-acetaminophen (PERCOCET/ROXICET) 5-325 MG tablet   pantoprazole (PROTONIX) 40 MG tablet   predniSONE (DELTASONE) 10 MG tablet   ramipril (ALTACE) 5 MG capsule   rosuvastatin (CRESTOR) 20 MG tablet   sildenafil (REVATIO) 20 MG tablet   TADALAFIL PO   tamsulosin (FLOMAX) 0.4 MG CAPS capsule   traZODone (DESYREL) 50 MG tablet   triamcinolone (NASACORT AQ) 55 MCG/ACT AERO nasal inhaler   No current facility-administered medications for this encounter.    If no changes, I anticipate pt can proceed with surgery as scheduled.   Willeen Cass, PhD, FNP-BC [facility] Short Stay [facility]/Anesthesiology Phone: [phone] 09/28/2021 11:48 AM

## 2021-09-30 ENCOUNTER — Ambulatory Visit (HOSPITAL_COMMUNITY): Payer: Medicare Other | Admitting: Emergency Medicine

## 2021-09-30 ENCOUNTER — Encounter (HOSPITAL_COMMUNITY): Payer: Self-pay | Admitting: Urology

## 2021-09-30 ENCOUNTER — Ambulatory Visit (HOSPITAL_BASED_OUTPATIENT_CLINIC_OR_DEPARTMENT_OTHER): Payer: Medicare Other | Admitting: Anesthesiology

## 2021-09-30 ENCOUNTER — Encounter (HOSPITAL_COMMUNITY)
Admission: RE | Disposition: A | Discharge status: Home / Self Care | Payer: Self-pay | Source: Home / Self Care | Attending: Urology

## 2021-09-30 ENCOUNTER — Ambulatory Visit (HOSPITAL_COMMUNITY)
Admission: RE | Admit: 2021-09-30 | Discharge: 2021-09-30 | Disposition: A | Discharge status: Home / Self Care | Payer: Medicare Other | Attending: Urology | Admitting: Urology

## 2021-09-30 ENCOUNTER — Other Ambulatory Visit: Payer: Self-pay

## 2021-09-30 DIAGNOSIS — I10 Essential (primary) hypertension: Secondary | ICD-10-CM | POA: Insufficient documentation

## 2021-09-30 DIAGNOSIS — Z87891 Personal history of nicotine dependence: Secondary | ICD-10-CM | POA: Diagnosis not present

## 2021-09-30 DIAGNOSIS — N471 Phimosis: Secondary | ICD-10-CM

## 2021-09-30 DIAGNOSIS — F418 Other specified anxiety disorders: Secondary | ICD-10-CM | POA: Diagnosis not present

## 2021-09-30 DIAGNOSIS — Z7984 Long term (current) use of oral hypoglycemic drugs: Secondary | ICD-10-CM | POA: Insufficient documentation

## 2021-09-30 DIAGNOSIS — I2510 Atherosclerotic heart disease of native coronary artery without angina pectoris: Secondary | ICD-10-CM | POA: Insufficient documentation

## 2021-09-30 DIAGNOSIS — K219 Gastro-esophageal reflux disease without esophagitis: Secondary | ICD-10-CM | POA: Insufficient documentation

## 2021-09-30 DIAGNOSIS — I25119 Atherosclerotic heart disease of native coronary artery with unspecified angina pectoris: Secondary | ICD-10-CM | POA: Diagnosis not present

## 2021-09-30 DIAGNOSIS — E119 Type 2 diabetes mellitus without complications: Secondary | ICD-10-CM | POA: Insufficient documentation

## 2021-09-30 DIAGNOSIS — N481 Balanitis: Secondary | ICD-10-CM

## 2021-09-30 DIAGNOSIS — E1165 Type 2 diabetes mellitus with hyperglycemia: Secondary | ICD-10-CM

## 2021-09-30 DIAGNOSIS — F319 Bipolar disorder, unspecified: Secondary | ICD-10-CM | POA: Insufficient documentation

## 2021-09-30 HISTORY — PX: CIRCUMCISION: SHX1350

## 2021-09-30 LAB — GLUCOSE, CAPILLARY
Glucose-Capillary: 151 mg/dL — ABNORMAL HIGH (ref 70–99)
Glucose-Capillary: 165 mg/dL — ABNORMAL HIGH (ref 70–99)

## 2021-09-30 SURGERY — CIRCUMCISION, ADULT
Anesthesia: General

## 2021-09-30 MED ORDER — LIDOCAINE HCL (PF) 1 % IJ SOLN
INTRAMUSCULAR | Status: AC
  Filled 2021-09-30: qty 30

## 2021-09-30 MED ORDER — LIDOCAINE HCL (PF) 2 % IJ SOLN
INTRAMUSCULAR | Status: AC
  Filled 2021-09-30: qty 5

## 2021-09-30 MED ORDER — EPHEDRINE SULFATE (PRESSORS) 50 MG/ML IJ SOLN
INTRAMUSCULAR | Status: DC | PRN
  Administered 2021-09-30: 10 mg via INTRAVENOUS

## 2021-09-30 MED ORDER — CARVEDILOL 12.5 MG PO TABS
12.5000 mg | ORAL_TABLET | Freq: Two times a day (BID) | ORAL | Status: DC
  Administered 2021-09-30: 12.5 mg via ORAL
  Filled 2021-09-30: qty 1

## 2021-09-30 MED ORDER — PROPOFOL 10 MG/ML IV BOLUS
INTRAVENOUS | Status: AC
  Filled 2021-09-30: qty 20

## 2021-09-30 MED ORDER — LACTATED RINGERS IV SOLN: INTRAVENOUS | Status: DC

## 2021-09-30 MED ORDER — BUPIVACAINE HCL (PF) 0.25 % IJ SOLN
INTRAMUSCULAR | Status: AC
  Filled 2021-09-30: qty 30

## 2021-09-30 MED ORDER — LIDOCAINE 2% (20 MG/ML) 5 ML SYRINGE
INTRAMUSCULAR | Status: DC | PRN
  Administered 2021-09-30: 80 mg via INTRAVENOUS

## 2021-09-30 MED ORDER — DEXAMETHASONE SODIUM PHOSPHATE 4 MG/ML IJ SOLN
INTRAMUSCULAR | Status: DC | PRN
  Administered 2021-09-30: 8 mg via INTRAVENOUS

## 2021-09-30 MED ORDER — LIDOCAINE-EPINEPHRINE 1 %-1:200000 IJ SOLN
INTRAMUSCULAR | Status: AC
Start: 2021-09-30 — End: ?
  Filled 2021-09-30: qty 30

## 2021-09-30 MED ORDER — LIDOCAINE HCL 1 % IJ SOLN
INTRAMUSCULAR | Status: DC | PRN
  Administered 2021-09-30: 10 mL

## 2021-09-30 MED ORDER — ORAL CARE MOUTH RINSE: 15.0000 mL | Freq: Once | OROMUCOSAL | Status: AC

## 2021-09-30 MED ORDER — CHLORHEXIDINE GLUCONATE 0.12 % MT SOLN
15.0000 mL | Freq: Once | OROMUCOSAL | Status: AC
  Administered 2021-09-30: 15 mL via OROMUCOSAL

## 2021-09-30 MED ORDER — ACETAMINOPHEN 500 MG PO TABS
1000.0000 mg | ORAL_TABLET | Freq: Once | ORAL | Status: AC
  Administered 2021-09-30: 1000 mg via ORAL
  Filled 2021-09-30: qty 2

## 2021-09-30 MED ORDER — DEXAMETHASONE SODIUM PHOSPHATE 10 MG/ML IJ SOLN
INTRAMUSCULAR | Status: AC
  Filled 2021-09-30: qty 1

## 2021-09-30 MED ORDER — FENTANYL CITRATE (PF) 100 MCG/2ML IJ SOLN
INTRAMUSCULAR | Status: AC
  Filled 2021-09-30: qty 2

## 2021-09-30 MED ORDER — BUPIVACAINE HCL (PF) 0.25 % IJ SOLN
INTRAMUSCULAR | Status: DC | PRN
  Administered 2021-09-30: 10 mL

## 2021-09-30 MED ORDER — PROPOFOL 10 MG/ML IV BOLUS
INTRAVENOUS | Status: DC | PRN
  Administered 2021-09-30: 120 mg via INTRAVENOUS

## 2021-09-30 MED ORDER — FENTANYL CITRATE PF 50 MCG/ML IJ SOSY: 25.0000 ug | PREFILLED_SYRINGE | INTRAMUSCULAR | Status: DC | PRN

## 2021-09-30 MED ORDER — EPHEDRINE 5 MG/ML INJ
INTRAVENOUS | Status: AC
Start: 2021-09-30 — End: ?
  Filled 2021-09-30: qty 5

## 2021-09-30 MED ORDER — CEFAZOLIN SODIUM-DEXTROSE 2-4 GM/100ML-% IV SOLN
2.0000 g | INTRAVENOUS | Status: AC
  Administered 2021-09-30: 2 g via INTRAVENOUS
  Filled 2021-09-30: qty 100

## 2021-09-30 MED ORDER — HYDROCODONE-ACETAMINOPHEN 5-325 MG PO TABS: 1.0000 | ORAL_TABLET | Freq: Four times a day (QID) | ORAL | 0 refills | Status: DC | PRN

## 2021-09-30 MED ORDER — AMLODIPINE BESYLATE 5 MG PO TABS
5.0000 mg | ORAL_TABLET | Freq: Every day | ORAL | Status: DC
  Administered 2021-09-30: 5 mg via ORAL
  Filled 2021-09-30: qty 1

## 2021-09-30 MED ORDER — ONDANSETRON HCL 4 MG/2ML IJ SOLN: 4.0000 mg | Freq: Once | INTRAMUSCULAR | Status: DC | PRN

## 2021-09-30 MED ORDER — FENTANYL CITRATE (PF) 100 MCG/2ML IJ SOLN
INTRAMUSCULAR | Status: DC | PRN
Start: 2021-09-30 — End: 2021-09-30
  Administered 2021-09-30 (×2): 50 ug via INTRAVENOUS

## 2021-09-30 MED ORDER — CELECOXIB 200 MG PO CAPS: 200.0000 mg | ORAL_CAPSULE | Freq: Two times a day (BID) | ORAL | 0 refills | Status: AC

## 2021-09-30 MED ORDER — ONDANSETRON HCL 4 MG/2ML IJ SOLN
INTRAMUSCULAR | Status: AC
Start: 2021-09-30 — End: ?
  Filled 2021-09-30: qty 2

## 2021-09-30 MED ORDER — BUPIVACAINE HCL 0.25 % IJ SOLN
INTRAMUSCULAR | Status: AC
Start: 2021-09-30 — End: ?
  Filled 2021-09-30: qty 1

## 2021-09-30 MED ORDER — PHENYLEPHRINE HCL-NACL 20-0.9 MG/250ML-% IV SOLN
INTRAVENOUS | Status: AC
  Filled 2021-09-30: qty 250

## 2021-09-30 MED ORDER — ONDANSETRON HCL 4 MG/2ML IJ SOLN
INTRAMUSCULAR | Status: DC | PRN
  Administered 2021-09-30: 4 mg via INTRAVENOUS

## 2021-09-30 SURGICAL SUPPLY — 33 items
BAG COUNTER SPONGE SURGICOUNT (BAG) IMPLANT
BAG SPNG CNTER NS LX DISP (BAG)
BANDAGE COBAN LF 1.5X5 NS (GAUZE/BANDAGES/DRESSINGS) ×1 IMPLANT
BLADE SURG 15 STRL LF DISP TIS (BLADE) ×2 IMPLANT
BLADE SURG 15 STRL SS (BLADE) ×2
BNDG CMPR 75X21 PLY HI ABS (MISCELLANEOUS) ×1
BNDG COHESIVE 1X5 TAN STRL LF (GAUZE/BANDAGES/DRESSINGS) ×3 IMPLANT
COVER SURGICAL LIGHT HANDLE (MISCELLANEOUS) ×3 IMPLANT
DRAPE LAPAROTOMY T 98X78 PEDS (DRAPES) ×3 IMPLANT
ELECT NDL TIP 2.8 STRL (NEEDLE) ×2 IMPLANT
ELECT NEEDLE TIP 2.8 STRL (NEEDLE) ×2 IMPLANT
ELECT PENCIL ROCKER SW 15FT (MISCELLANEOUS) ×3 IMPLANT
ELECT REM PT RETURN 15FT ADLT (MISCELLANEOUS) ×3 IMPLANT
GAUZE PETROLATUM 1 X8 (GAUZE/BANDAGES/DRESSINGS) ×3 IMPLANT
GAUZE STRETCH 2X75IN STRL (MISCELLANEOUS) ×3 IMPLANT
GLOVE SS BIOGEL STRL SZ 7 (GLOVE) ×2 IMPLANT
GLOVE SUPERSENSE BIOGEL SZ 7 (GLOVE) ×1
GLOVE SURG LX 7.5 STRW (GLOVE) ×1
GLOVE SURG LX STRL 7.5 STRW (GLOVE) ×2 IMPLANT
GOWN STRL REUS W/ TWL XL LVL3 (GOWN DISPOSABLE) ×2 IMPLANT
GOWN STRL REUS W/TWL XL LVL3 (GOWN DISPOSABLE) ×2
KIT BASIN OR (CUSTOM PROCEDURE TRAY) ×3 IMPLANT
KIT TURNOVER KIT A (KITS) IMPLANT
NS IRRIG 1000ML POUR BTL (IV SOLUTION) ×3 IMPLANT
PACK BASIC VI WITH GOWN DISP (CUSTOM PROCEDURE TRAY) ×3 IMPLANT
SUT CHROMIC 3 0 SH 27 (SUTURE) ×6 IMPLANT
SUT CHROMIC 4 0 RB 1X27 (SUTURE) ×3 IMPLANT
SUT PROLENE 4 0 P 3 18 (SUTURE) IMPLANT
SYR CONTROL 10ML LL (SYRINGE) ×3 IMPLANT
TOWEL OR 17X26 10 PK STRL BLUE (TOWEL DISPOSABLE) IMPLANT
TOWEL OR NON WOVEN STRL DISP B (DISPOSABLE) ×3 IMPLANT
WATER STERILE IRR 1000ML POUR (IV SOLUTION) IMPLANT
YANKAUER SUCT BULB TIP 10FT TU (MISCELLANEOUS) ×3 IMPLANT

## 2021-09-30 NOTE — Op Note (Signed)
Pre-operative diagnosis: Phimosis Balanitis  Postoperative diagnosis: same  Procedure:  Circumcision Penile block  Surgeon: Donald Pore. M.D.  Anesthesia: General  Complications: None  EBL: Minimal  Specimens: none  Indication: Phillip [name] is a 76 y.o. male who was diagnosed with balanitis and phimosis.  He elected to proceed with circumcision for treatment. The potential risks, complications, alternative options, and expected recovery process was discussed in detail and informed consent was obtained.  Description of procedure: The patient was taken to the operating room and a general anesthetic was administered. He was given preoperative antibiotics, placed in the supine position, and his genitalia was prepped and draped in the usual sterile fashion. Next, a preoperative timeout was performed.  A dorsal and circumferential penile block was done using a 50-50 mixture of 0.25 bupivacaine and 1% lidocaine.   The penis was examined and appropriate incision sites were marked circumferentially proximal and distally. These incision sites were then incised with the knife circumferentially allowing the skin to be separated distally and approximately and isolating the foreskin. Hemostats were then placed in the dorsal aspect of the foreskin and Metzenbaum scissors were used to create an opening connecting the proximal and distal incision sites. The remainder of the foreskin was then excised utilizing cautery dissection as necessary. Once the foreskin was removed, the underlying penile tissue and dartos fascia was examined and hemostasis was achieved with cautery as necessary. The proximal and distal incision sites were then reapproximated utilizing 3-0 chromic suture. 4 interrupted sutures were placed dorsally, ventrally, and on either side. The intervening tissue between each quadrant was then reapproximated with running 3-0 chromic suture.  A gauze dressing was then placed and secured with  [place].   The patient tolerated the procedure well and without complications. He was able to be awakened and transferred to the recovery unit in satisfactory condition.  Donald Pore MD 09/30/2021, 9:53 AM  [facility]  Pager: [phone]

## 2021-09-30 NOTE — Transfer of Care (Signed)
Immediate Anesthesia Transfer of Care Note  Patient: Phillip [name]  Procedure(s) Performed: CIRCUMCISION ADULT  Patient Location: PACU  Anesthesia Type:General  Level of Consciousness: awake  Airway & Oxygen Therapy: Patient Spontanous Breathing  Post-op Assessment: Report given to RN  Post vital signs: stable  Last Vitals:  Vitals Value Taken Time  BP 154/89 09/30/21 0956  Temp 36.3 C 09/30/21 0956  Pulse 63 09/30/21 1000  Resp 8 09/30/21 1000  SpO2 100 % 09/30/21 1000  Vitals shown include unvalidated device data.  Last Pain:  Vitals:   09/30/21 0652  TempSrc: Oral         Complications: No notable events documented.

## 2021-09-30 NOTE — Discharge Instructions (Signed)
Postoperative instructions for circumcision  Wound:  Remove the dressing the morning after surgery. In most cases your incision will have absorbable sutures that run along the course of your incision and will dissolve within the first 10-20 days. Some will fall out even earlier. Expect some redness as the sutures dissolved but this should occur only around the sutures. If there is generalized redness, especially with increasing pain or swelling, let us know. The penis will possibly get "black and blue" as the blood in the tissues spread. Sometimes the whole penis will turn colors. The black and blue is followed by a yellow and brown color. In time, all the discoloration will go away.  Diet:  You may return to your normal diet within 24 hours following your surgery. You may note some mild nausea and possibly vomiting the first 6-8 hours following surgery. This is usually due to the side effects of anesthesia, and will disappear quite soon. I would suggest clear liquids and a very light meal the first evening following your surgery.  Activity:  Your physical activity should be restricted the first 48 hours. During that time you should remain relatively inactive, moving about only when necessary. During the first 2 weeks following surgery he should avoid lifting any heavy objects (anything greater than 15 pounds), and avoid strenuous exercise. If you work, ask [place] specifically about your restrictions, both for work and home. We will write a note to your employer if needed.   Do not "use" your penis for 3 weeks or until the incisions are completely healed, whichever comes later.  Ice packs can be placed on and off over the penis for the first 48 hours to help relieve the pain and keep the swelling down. Frozen peas or corn in a ZipLock bag can be frozen, used and re-frozen. Fifteen minutes on and 15 minutes off is a reasonable schedule.  No sexual activity for 1 month.  Hygiene:  You may shower 24  hours after your surgery. Make sure wound is clean and dry afterwards. Tub bathing should be restricted until the seventh day.  Medication:  You will be sent home with some type of pain medication. In many cases you will be sent home with a narcotic pain pill (Vicodin or Tylox). If the pain is not too bad, you may take either Tylenol (acetaminophen) or Advil (ibuprofen) which contain no narcotic agents, and might be tolerated a little better, with fewer side effects. If the pain medication you are sent home with does not control the pain, you will have to let us know. Some narcotic pain medications cannot be given or refilled by a phone call to a pharmacy.  Problems you should report to [place]:  Fever of 101.0 degrees Fahrenheit or greater. Moderate or severe swelling under the skin incision or involving the scrotum. Drug reaction such as hives, a rash, nausea or vomiting.  Difficulty voiding

## 2021-09-30 NOTE — Anesthesia Postprocedure Evaluation (Signed)
Anesthesia Post Note  Patient: Phillip [name]  Procedure(s) Performed: CIRCUMCISION ADULT     Patient location during evaluation: PACU Anesthesia Type: General Level of consciousness: awake and alert Pain management: pain level controlled Vital Signs Assessment: post-procedure vital signs reviewed and stable Respiratory status: spontaneous breathing, nonlabored ventilation, respiratory function stable and patient connected to nasal cannula oxygen Cardiovascular status: blood pressure returned to baseline and stable Postop Assessment: no apparent nausea or vomiting Anesthetic complications: no   No notable events documented.  Last Vitals:  Vitals:   09/30/21 1045 09/30/21 1053  BP: (!) 161/73 (!) 173/71  Pulse: (!) 57 (!) 58  Resp: 12 20  Temp: 36.7 C   SpO2: 98% 99%    Last Pain:  Vitals:   09/30/21 1053  TempSrc:   PainSc: 0-No pain                 Santa Lighter

## 2021-09-30 NOTE — Anesthesia Procedure Notes (Signed)
Procedure Name: LMA Insertion Date/Time: 09/30/2021 8:53 AM  Performed by: Ky Moskowitz, Forest Gleason, CRNAPre-anesthesia Checklist: Patient identified, Emergency Drugs available, Suction available, Patient being monitored and Timeout performed Patient Re-evaluated:Patient Re-evaluated prior to induction Oxygen Delivery Method: Circle system utilized Preoxygenation: Pre-oxygenation with 100% oxygen Induction Type: IV induction Ventilation: Mask ventilation without difficulty LMA: LMA with gastric port inserted LMA Size: 4.0 Dental Injury: Teeth and Oropharynx as per pre-operative assessment

## 2021-09-30 NOTE — H&P (Signed)
H&P  History of Present Illness: Phillip [name] is a 76 y.o. year old who presents  today for circumcision. No acute complaints  Past Medical History:  Diagnosis Date   Anemia    Anxiety    Arthritis    Bipolar depression ([place])    CAD, multiple vessel    Cancer ([place])    skin, basal   Chronic pain disorder    chronic bilat shoulder pain and feet pain   Depression    Diabetes mellitus without complication ([place])    Erectile dysfunction 01/17/2013   GERD (gastroesophageal reflux disease)    History of colon polyps    Hyperlipidemia    Hypertension    Insomnia    Iron deficiency anemia 01/16/2013   Mixed hyperlipidemia 03/08/2013   Posterior neck pain 01/22/2019   Status post insertion of drug-eluting stent into left anterior descending (LAD) artery for coronary artery disease    Stented coronary artery    X 2   Systolic murmur    Unspecified vitamin D deficiency 01/17/2013    Past Surgical History:  Procedure Laterality Date   CAROTID STENT  2008   CORONARY ANGIOPLASTY WITH STENT PLACEMENT  about 2014   current Cardiology: Dr Orene Desanctis   [place] Medications:  Current Meds  Medication Sig   ALPRAZolam (XANAX) 1 MG tablet TAKE 1 TABLET BY MOUTH TWICE DAILY AS NEEDED (Patient taking differently: Take 1 mg by mouth 2 (two) times daily as needed for anxiety.)   amLODipine (NORVASC) 5 MG tablet TAKE 1 TABLET BY MOUTH DAILY IN THE MORNING AND AT BEDTIME (Patient taking differently: Take 10 mg by mouth at bedtime.)   aspirin 81 MG tablet Take 81 mg by mouth daily.   carbamazepine (TEGRETOL) 200 MG tablet Take 1 tablet (200 mg total) by mouth 2 (two) times daily. (Patient taking differently: Take 200 mg by mouth See admin instructions. Take 200 mg by mouth twice a day as needed for bipolar)   carvedilol (COREG) 12.5 MG tablet Take 1 tablet (12.5 mg total) by mouth 2 (two) times daily with a meal.   clobetasol cream (TEMOVATE) 0.05 % APPLY TOPICALLY TWICE DAILY AS  NEEDED   Cyanocobalamin (VITAMIN B 12 PO) Take 1 capsule by mouth in the morning and at bedtime.   dapagliflozin propanediol (FARXIGA) 10 MG TABS tablet Take 1 tablet (10 mg total) by mouth daily before breakfast.   ferrous sulfate 325 (65 FE) MG EC tablet Take 1 tablet (325 mg total) by mouth 2 (two) times a day. (Patient taking differently: Take 2 tablets by mouth 2 (two) times a day.)   folic acid (FOLVITE) 1 MG tablet TAKE 1 TABLET BY MOUTH DAILY (Patient taking differently: Take 1 mg by mouth in the morning and at bedtime.)   HYDROcodone-acetaminophen (NORCO/VICODIN) 5-325 MG tablet Take 1 tablet by mouth as needed for severe pain.   hydroxychloroquine (PLAQUENIL) 200 MG tablet TAKE 2 TABLETS BY MOUTH DAILY (Patient taking differently: Take 200 mg by mouth daily.)   hydrOXYzine (ATARAX/VISTARIL) 25 MG tablet TAKE 1 TABLET BY MOUTH 3 TIMES DAILY AS NEEDED (Patient taking differently: Take 25 mg by mouth daily.)   ibuprofen (ADVIL) 200 MG tablet Take 400 mg by mouth daily as needed (pain).   leflunomide (ARAVA) 20 MG tablet Take 40 mg by mouth at bedtime.   metFORMIN (GLUCOPHAGE-XR) 500 MG 24 hr tablet Take 1 tablet (500 mg total) by mouth daily with breakfast.   Omega-3  Fatty Acids (FISH OIL PO) Take 1 capsule by mouth daily.   OVER THE COUNTER MEDICATION Apply 1 Application topically as needed (pain). CBD oil   pantoprazole (PROTONIX) 40 MG tablet Take 1 tablet (40 mg total) by mouth 2 (two) times daily.   predniSONE (DELTASONE) 10 MG tablet TAKE 1 TABLET BY MOUTH DAILY (Patient taking differently: Take 10 mg by mouth daily.)   ramipril (ALTACE) 5 MG capsule Take 1 capsule (5 mg total) by mouth daily.   rosuvastatin (CRESTOR) 20 MG tablet Take 1 tablet (20 mg total) by mouth daily.   TADALAFIL PO Take 1 tablet by mouth as needed (sexual disfunction).   tamsulosin (FLOMAX) 0.4 MG CAPS capsule Take 1 capsule (0.4 mg total) by mouth daily.   traZODone (DESYREL) 50 MG tablet 1 tab by mouth at  bedtime (Patient taking differently: Take 50 mg by mouth at bedtime.)    Allergies:  Allergies  Allergen Reactions   Lipitor [Atorvastatin] Other (See Comments)    Myalgia, was unable to walk per Pt.     Family History  Problem Relation Age of Onset   Cancer Mother    Heart disease Mother    Heart disease Maternal Grandmother    Stroke Maternal Grandmother    Hyperlipidemia Maternal Grandmother    Cancer Maternal Grandmother    Lung cancer Father    Alzheimer's disease Brother    Cancer Maternal Grandfather    Heart disease Paternal Grandfather    Stroke Paternal Grandfather     Social History:  reports that he quit smoking about 19 years ago. His smoking use included cigarettes. He has a 20.00 pack-year smoking history. His smokeless tobacco use includes chew. He reports current alcohol use. He reports that he does not use drugs.  ROS: A complete review of systems was performed.  All systems are negative except for pertinent findings as noted.  Physical Exam:  Vital signs in last 24 hours: Temp:  [97.9 F (36.6 C)] 97.9 F (36.6 C) (08/09 0652) Pulse Rate:  [76] 76 (08/09 0652) Resp:  [18] 18 (08/09 0652) BP: (168)/(76) 168/76 (08/09 0652) SpO2:  [99 %] 99 % (08/09 0652) Weight:  [70.3 kg] 70.3 kg (08/09 0811) Constitutional:  Alert and oriented, No acute distress Cardiovascular: Regular rate and rhythm, No JVD Respiratory: Normal respiratory effort, Lungs clear bilaterally GI: Abdomen is soft, nontender, nondistended, no abdominal masses GU: No CVA tenderness Lymphatic: No lymphadenopathy Neurologic: Grossly intact, no focal deficits Psychiatric: Normal mood and affect Drains/Tubes: none   Laboratory Data:  No results for input(s): "WBC", "HGB", "HCT", "PLT" in the last 72 hours.  No results for input(s): "NA", "K", "CL", "GLUCOSE", "BUN", "CALCIUM", "CREATININE" in the last 72 hours.  Invalid input(s): "CO3"   Results for orders placed or performed during  the hospital encounter of 09/30/21 (from the past 24 hour(s))  Glucose, capillary     Status: Abnormal   Collection Time: 09/30/21  6:55 AM  Result Value Ref Range   Glucose-Capillary 151 (H) 70 - 99 mg/dL   No results found for this or any previous visit (from the past 240 hour(s)).  Renal Function: Recent Labs    09/25/21 0930  CREATININE 0.67   Estimated Creatinine Clearance: 78.1 mL/min (by C-G formula based on SCr of 0.67 mg/dL).  Radiologic Imaging: No results found.  Assessment:  76 yo M with balanitis, phimosis  Plan:  To OR for circumcision. Procedure and risks reviewed  Donald Pore, MD 09/30/2021, 8:12 AM  [facility] Pager: [phone]

## 2021-10-01 ENCOUNTER — Encounter (HOSPITAL_COMMUNITY): Payer: Self-pay | Admitting: Urology

## 2021-10-16 ENCOUNTER — Other Ambulatory Visit: Payer: Self-pay | Admitting: Internal Medicine

## 2021-10-16 NOTE — Telephone Encounter (Signed)
Please refill as per office routine med refill policy (all routine meds to be refilled for 3 mo or monthly (per pt preference) up to one year from last visit, then month to month grace period for 3 mo, then further med refills will have to be denied) ? ?

## 2021-10-20 DIAGNOSIS — R3913 Splitting of urinary stream: Secondary | ICD-10-CM | POA: Diagnosis not present

## 2021-10-20 DIAGNOSIS — N471 Phimosis: Secondary | ICD-10-CM | POA: Diagnosis not present

## 2021-10-20 DIAGNOSIS — N481 Balanitis: Secondary | ICD-10-CM | POA: Diagnosis not present

## 2021-11-16 DIAGNOSIS — H524 Presbyopia: Secondary | ICD-10-CM | POA: Diagnosis not present

## 2021-11-18 ENCOUNTER — Other Ambulatory Visit: Payer: Self-pay | Admitting: Internal Medicine

## 2021-12-04 ENCOUNTER — Ambulatory Visit (INDEPENDENT_AMBULATORY_CARE_PROVIDER_SITE_OTHER): Payer: Medicare Other | Admitting: Internal Medicine

## 2021-12-04 ENCOUNTER — Encounter: Payer: Self-pay | Admitting: Internal Medicine

## 2021-12-04 VITALS — BP 138/68 | HR 64 | Temp 97.5°F | Ht 70.0 in | Wt 156.0 lb

## 2021-12-04 DIAGNOSIS — E1165 Type 2 diabetes mellitus with hyperglycemia: Secondary | ICD-10-CM | POA: Diagnosis not present

## 2021-12-04 DIAGNOSIS — Z23 Encounter for immunization: Secondary | ICD-10-CM

## 2021-12-04 DIAGNOSIS — E782 Mixed hyperlipidemia: Secondary | ICD-10-CM | POA: Diagnosis not present

## 2021-12-04 DIAGNOSIS — I10 Essential (primary) hypertension: Secondary | ICD-10-CM

## 2021-12-04 DIAGNOSIS — E559 Vitamin D deficiency, unspecified: Secondary | ICD-10-CM

## 2021-12-04 LAB — BASIC METABOLIC PANEL
BUN: 8 mg/dL (ref 6–23)
CO2: 28 mEq/L (ref 19–32)
Calcium: 9.1 mg/dL (ref 8.4–10.5)
Chloride: 92 mEq/L — ABNORMAL LOW (ref 96–112)
Creatinine, Ser: 0.64 mg/dL (ref 0.40–1.50)
GFR: 91.89 mL/min (ref >60.00)
Glucose, Bld: 174 mg/dL — ABNORMAL HIGH (ref 70–99)
Potassium: 3.9 mEq/L (ref 3.5–5.1)
Sodium: 128 mEq/L — ABNORMAL LOW (ref 135–145)

## 2021-12-04 LAB — HEPATIC FUNCTION PANEL
ALT: 17 U/L (ref 0–53)
AST: 22 U/L (ref 0–37)
Albumin: 4.1 g/dL (ref 3.5–5.2)
Alkaline Phosphatase: 58 U/L (ref 39–117)
Bilirubin, Direct: 0 mg/dL (ref 0.0–0.3)
Total Bilirubin: 0.2 mg/dL (ref 0.2–1.2)
Total Protein: 7.3 g/dL (ref 6.0–8.3)

## 2021-12-04 LAB — LIPID PANEL
Cholesterol: 150 mg/dL (ref 0–200)
HDL: 60 mg/dL (ref >39.00)
LDL Cholesterol: 64 mg/dL (ref 0–99)
NonHDL: 89.52
Total CHOL/HDL Ratio: 2
Triglycerides: 126 mg/dL (ref 0.0–149.0)
VLDL: 25.2 mg/dL (ref 0.0–40.0)

## 2021-12-04 LAB — VITAMIN D 25 HYDROXY (VIT D DEFICIENCY, FRACTURES): VITD: 86.28 ng/mL (ref 30.00–100.00)

## 2021-12-04 LAB — HEMOGLOBIN A1C: Hgb A1c MFr Bld: 6.9 % — ABNORMAL HIGH (ref 4.6–6.5)

## 2021-12-04 NOTE — Assessment & Plan Note (Signed)
BP Readings from Last 3 Encounters:  12/04/21 138/68  09/30/21 (!) 173/71  09/25/21 (!) 147/67   Stable and improved pt to continue medical treatment norvasc 5 mg, coreg 12.5 bid

## 2021-12-04 NOTE — Progress Notes (Signed)
Patient ID: Phillip [name], male   DOB: [birth year], 76 y.o.   MRN: [id number]        Chief Complaint: follow up HTN, HLD and DM       HPI:  Phillip [name] is a 76 y.o. male here overall doing ok, but did cut back on the metformin to 1 pill per day due to GI upset and diarrhea.  Pt denies chest pain, increased sob or doe, wheezing, orthopnea, PND, increased LE swelling, palpitations, dizziness or syncope.   Pt denies polydipsia, polyuria, or new focal neuro s/s.    Pt denies fever, wt loss, night sweats, loss of appetite, or other constitutional symptoms  Due for flu shot Wt Readings from Last 3 Encounters:  12/04/21 156 lb (70.8 kg)  09/30/21 155 lb (70.3 kg)  09/25/21 151 lb 12.8 oz (68.9 kg)   BP Readings from Last 3 Encounters:  12/04/21 138/68  09/30/21 (!) 173/71  09/25/21 (!) 147/67         Past Medical History:  Diagnosis Date   Anemia    Anxiety    Arthritis    Bipolar depression ([place])    CAD, multiple vessel    Cancer ([place])    skin, basal   Chronic pain disorder    chronic bilat shoulder pain and feet pain   Depression    Diabetes mellitus without complication ([place])    Erectile dysfunction 01/17/2013   GERD (gastroesophageal reflux disease)    History of colon polyps    Hyperlipidemia    Hypertension    Insomnia    Iron deficiency anemia 01/16/2013   Mixed hyperlipidemia 03/08/2013   Posterior neck pain 01/22/2019   Status post insertion of drug-eluting stent into left anterior descending (LAD) artery for coronary artery disease    Stented coronary artery    X 2   Systolic murmur    Unspecified vitamin D deficiency 01/17/2013   Past Surgical History:  Procedure Laterality Date   CAROTID STENT  2008   CIRCUMCISION N/A 09/30/2021   Procedure: CIRCUMCISION ADULT;  Surgeon: Vira Agar, MD;  Location: [facility];  Service: Urology;  Laterality: N/A;   CORONARY ANGIOPLASTY WITH STENT PLACEMENT  about 2014   current Cardiology: Dr Orene Desanctis   EYE SURGERY       reports that he quit smoking about 19 years ago. His smoking use included cigarettes. He has a 20.00 pack-year smoking history. His smokeless tobacco use includes chew. He reports current alcohol use. He reports that he does not use drugs. family history includes Alzheimer's disease in his brother; Cancer in his maternal grandfather, maternal grandmother, and mother; Heart disease in his maternal grandmother, mother, and paternal grandfather; Hyperlipidemia in his maternal grandmother; Lung cancer in his father; Stroke in his maternal grandmother and paternal grandfather. Allergies  Allergen Reactions   Lipitor [Atorvastatin] Other (See Comments)    Myalgia, was unable to walk per Pt.    Current Outpatient Medications on File Prior to Visit  Medication Sig Dispense Refill   ALPRAZolam (XANAX) 1 MG tablet TAKE 1 TABLET BY MOUTH TWICE DAILY AS NEEDED 60 tablet 2   amLODipine (NORVASC) 5 MG tablet TAKE 1 TABLET BY MOUTH DAILY IN THE MORNING AND AT BEDTIME (Patient taking differently: Take 10 mg by mouth at bedtime.) 180 tablet 3   aspirin 81 MG tablet Take 81 mg by mouth daily.     carbamazepine (TEGRETOL) 200 MG tablet Take 1 tablet (200 mg total) by mouth 2 (two)  times daily. (Patient taking differently: Take 200 mg by mouth See admin instructions. Take 200 mg by mouth twice a day as needed for bipolar) 180 tablet 1   carvedilol (COREG) 12.5 MG tablet Take 1 tablet (12.5 mg total) by mouth 2 (two) times daily with a meal. 90 tablet 3   clobetasol cream (TEMOVATE) 0.05 % APPLY TOPICALLY TWICE DAILY AS NEEDED 60 g 1   Cyanocobalamin (VITAMIN B 12 PO) Take 1 capsule by mouth in the morning and at bedtime.     dapagliflozin propanediol (FARXIGA) 10 MG TABS tablet Take 1 tablet (10 mg total) by mouth daily before breakfast. 90 tablet 3   ferrous sulfate 325 (65 FE) MG EC tablet Take 1 tablet (325 mg total) by mouth 2 (two) times a day. (Patient taking differently: Take 2 tablets by mouth 2 (two) times a  day.) 60 tablet 5   folic acid (FOLVITE) 1 MG tablet TAKE 1 TABLET BY MOUTH DAILY (Patient taking differently: Take 1 mg by mouth in the morning and at bedtime.) 90 tablet 0   HYDROcodone-acetaminophen (NORCO/VICODIN) 5-325 MG tablet Take 1 tablet by mouth every 6 (six) hours as needed for severe pain. 16 tablet 0   hydroxychloroquine (PLAQUENIL) 200 MG tablet TAKE 2 TABLETS BY MOUTH DAILY (Patient taking differently: Take 200 mg by mouth daily.) 60 tablet 5   hydrOXYzine (ATARAX/VISTARIL) 25 MG tablet TAKE 1 TABLET BY MOUTH 3 TIMES DAILY AS NEEDED (Patient taking differently: Take 25 mg by mouth daily.) 270 tablet 1   ibuprofen (ADVIL) 200 MG tablet Take 400 mg by mouth daily as needed (pain).     leflunomide (ARAVA) 20 MG tablet Take 40 mg by mouth at bedtime.     metFORMIN (GLUCOPHAGE-XR) 500 MG 24 hr tablet Take 1 tablet (500 mg total) by mouth daily with breakfast. 90 tablet 3   nitroGLYCERIN (NITROSTAT) 0.4 MG SL tablet Place 1 tablet (0.4 mg total) under the tongue every 5 (five) minutes as needed for chest pain. 25 tablet 3   Omega-3 Fatty Acids (FISH OIL PO) Take 1 capsule by mouth daily.     OVER THE COUNTER MEDICATION Apply 1 Application topically as needed (pain). CBD oil     pantoprazole (PROTONIX) 40 MG tablet Take 1 tablet (40 mg total) by mouth 2 (two) times daily. 180 tablet 3   predniSONE (DELTASONE) 10 MG tablet TAKE 1 TABLET BY MOUTH DAILY (Patient taking differently: Take 10 mg by mouth daily.) 90 tablet 1   ramipril (ALTACE) 5 MG capsule Take 1 capsule (5 mg total) by mouth daily. 90 capsule 3   rosuvastatin (CRESTOR) 20 MG tablet Take 1 tablet (20 mg total) by mouth daily. 90 tablet 3   sildenafil (REVATIO) 20 MG tablet Take 3 pills daily as needed 60 tablet 2   TADALAFIL PO Take 1 tablet by mouth as needed (sexual disfunction).     tamsulosin (FLOMAX) 0.4 MG CAPS capsule TAKE 1 CAPSULE BY MOUTH DAILY 90 capsule 0   traZODone (DESYREL) 50 MG tablet 1 tab by mouth at bedtime  (Patient taking differently: Take 50 mg by mouth at bedtime.) 90 tablet 3   triamcinolone (NASACORT AQ) 55 MCG/ACT AERO nasal inhaler Place 2 sprays into the nose daily. 1 Inhaler 12   Clobetasol Prop Crea-Coal Tar (CLOBETA CREAM EX) Clobeta Cream     No current facility-administered medications on file prior to visit.        ROS:  All others reviewed and negative.  Objective  PE:  BP 138/68 (BP Location: Right Arm, Patient Position: Sitting, Cuff Size: Large)   Pulse 64   Temp (!) 97.5 F (36.4 C) (Oral)   Ht '5\' 10"'$  (1.778 m)   Wt 156 lb (70.8 kg)   SpO2 96%   BMI 22.38 kg/m                 Constitutional: Pt appears in NAD               HENT: Head: NCAT.                Right Ear: External ear normal.                 Left Ear: External ear normal.                Eyes: . Pupils are equal, round, and reactive to light. Conjunctivae and EOM are normal               Nose: without d/c or deformity               Neck: Neck supple. Gross normal ROM               Cardiovascular: Normal rate and regular rhythm.                 Pulmonary/Chest: Effort normal and breath sounds without rales or wheezing.                Abd:  Soft, NT, ND, + BS, no organomegaly               Neurological: Pt is alert. At baseline orientation, motor grossly intact               Skin: Skin is warm. No rashes, no other new lesions, LE edema - none               Psychiatric: Pt behavior is normal without agitation   Micro: none  Cardiac tracings I have personally interpreted today:  none  Pertinent Radiological findings (summarize): none   Lab Results  Component Value Date   WBC 8.9 09/25/2021   HGB 13.0 09/25/2021   HCT 39.9 09/25/2021   PLT 328 09/25/2021   GLUCOSE 174 (H) 12/04/2021   CHOL 150 12/04/2021   TRIG 126.0 12/04/2021   HDL 60.00 12/04/2021   LDLDIRECT 142.5 02/27/2014   LDLCALC 64 12/04/2021   ALT 17 12/04/2021   AST 22 12/04/2021   NA 128 (L) 12/04/2021   K 3.9 12/04/2021    CL 92 (L) 12/04/2021   CREATININE 0.64 12/04/2021   BUN 8 12/04/2021   CO2 28 12/04/2021   TSH 1.71 03/09/2021   PSA 0.58 03/09/2021   INR 1.0 09/15/2008   HGBA1C 6.9 (H) 12/04/2021   MICROALBUR <0.7 03/09/2021   Assessment/Plan:  Phillip [name] is a 76 y.o. White or Caucasian [1] male with  has a past medical history of Anemia, Anxiety, Arthritis, Bipolar depression ([place]), CAD, multiple vessel, Cancer ([place]), Chronic pain disorder, Depression, Diabetes mellitus without complication ([place]), Erectile dysfunction (01/17/2013), GERD (gastroesophageal reflux disease), History of colon polyps, Hyperlipidemia, Hypertension, Insomnia, Iron deficiency anemia (01/16/2013), Mixed hyperlipidemia (03/08/2013), Posterior neck pain (01/22/2019), Status post insertion of drug-eluting stent into left anterior descending (LAD) artery for coronary artery disease, Stented coronary artery, Systolic murmur, and Unspecified vitamin D deficiency (01/17/2013).  Vitamin D deficiency Last vitamin D Lab Results  Component Value Date  VD25OH 86.28 12/04/2021   Stable, cont oral replacement   Hypertension BP Readings from Last 3 Encounters:  12/04/21 138/68  09/30/21 (!) 173/71  09/25/21 (!) 147/67   Stable and improved pt to continue medical treatment norvasc 5 mg, coreg 12.5 bid   Hyperlipidemia Lab Results  Component Value Date   LDLCALC 64 12/04/2021   Stable, pt to continue current statin crestor 20 mg   Diabetes ([place]) Lab Results  Component Value Date   HGBA1C 6.9 (H) 12/04/2021   uncontrolled, pt to continue current medical treatment farxiga, metformin ER 500 mg - 1 qd as declines change for now  Followup: Return in about 6 months (around 06/05/2022).  Cathlean Cower, MD 12/04/2021 1:05 PM [facility]

## 2021-12-04 NOTE — Assessment & Plan Note (Addendum)
Lab Results  Component Value Date   HGBA1C 6.9 (H) 12/04/2021   uncontrolled, pt to continue current medical treatment farxiga, metformin ER 500 mg - 1 qd as declines change for now

## 2021-12-04 NOTE — Assessment & Plan Note (Signed)
Lab Results  Component Value Date   LDLCALC 64 12/04/2021   Stable, pt to continue current statin crestor 20 mg

## 2021-12-04 NOTE — Assessment & Plan Note (Signed)
Last vitamin D Lab Results  Component Value Date   VD25OH 86.28 12/04/2021   Stable, cont oral replacement

## 2021-12-04 NOTE — Patient Instructions (Signed)
You had the flu shot today  Please continue all other medications as before, including the metformin at one pill per day  Please have the pharmacy call with any other refills you may need.  Please continue your efforts at being more active, low cholesterol diet, and weight control.  Please keep your appointments with your specialists as you may have planned  Please go to the LAB at the blood drawing area for the tests to be done  You will be contacted by phone if any changes need to be made immediately.  Otherwise, you will receive a letter about your results with an explanation, but please check with MyChart first.  Please remember to sign up for MyChart if you have not done so, as this will be important to you in the future with finding out test results, communicating by private email, and scheduling acute appointments online when needed.  Please make an Appointment to return in 6 months, or sooner if needed

## 2021-12-19 ENCOUNTER — Other Ambulatory Visit: Payer: Self-pay | Admitting: Internal Medicine

## 2022-02-18 ENCOUNTER — Other Ambulatory Visit: Payer: Self-pay | Admitting: Internal Medicine

## 2022-02-24 ENCOUNTER — Other Ambulatory Visit: Payer: Self-pay | Admitting: Internal Medicine

## 2022-02-25 DIAGNOSIS — N529 Male erectile dysfunction, unspecified: Secondary | ICD-10-CM | POA: Diagnosis not present

## 2022-03-09 ENCOUNTER — Other Ambulatory Visit: Payer: Self-pay | Admitting: Internal Medicine

## 2022-03-26 ENCOUNTER — Other Ambulatory Visit: Payer: Self-pay | Admitting: Internal Medicine

## 2022-03-29 ENCOUNTER — Other Ambulatory Visit: Payer: Self-pay | Admitting: Internal Medicine

## 2022-03-29 DIAGNOSIS — E785 Hyperlipidemia, unspecified: Secondary | ICD-10-CM

## 2022-03-29 NOTE — Telephone Encounter (Signed)
Please refill as per office routine med refill policy (all routine meds to be refilled for 3 mo or monthly (per pt preference) up to one year from last visit, then month to month grace period for 3 mo, then further med refills will have to be denied) ? ?

## 2022-04-07 ENCOUNTER — Other Ambulatory Visit (HOSPITAL_COMMUNITY): Payer: Self-pay

## 2022-04-07 ENCOUNTER — Telehealth: Payer: Self-pay

## 2022-04-07 NOTE — Telephone Encounter (Signed)
Pharmacy Patient Advocate Encounter   Received notification from [facility] that prior authorization for hydrOXYzine HCl 25MG tablets is required/requested.     PA submitted on 04/08/23 to (ins) [facility] Protection MEDICARE PART D via CoverMyMeds Key [id number]  Status is pending

## 2022-04-08 NOTE — Telephone Encounter (Signed)
Pharmacy Patient Advocate Encounter  Prior Authorization for hydrOXYzine HCl 25MG tablets has been approved.    key# [id number] Effective dates: 2.14.24 through 2.14.25  .

## 2022-04-09 NOTE — Telephone Encounter (Signed)
[employer] called and confirmed this information and states if we have any questions to call:  [phone] Option 5

## 2022-04-22 ENCOUNTER — Telehealth: Payer: Self-pay | Admitting: Cardiovascular Disease

## 2022-04-22 NOTE — Telephone Encounter (Signed)
*  STAT* If patient is at the pharmacy, call can be transferred to refill team.   1. Which medications need to be refilled? (please list name of each medication and dose if known) nitroGLYCERIN (NITROSTAT) 0.4 MG SL tablet   2. Which pharmacy/location (including street and city if local pharmacy) is medication to be sent to?  [place], [place]    3. Do they need a 30 day or 90 day supply? 90   Patient has appt 3/7

## 2022-04-22 NOTE — Telephone Encounter (Signed)
Left message to call the clinic.

## 2022-04-22 NOTE — Telephone Encounter (Signed)
Pt c/o of Chest Pain: STAT if CP now or developed within 24 hours  1. Are you having CP right now? no  2. Are you experiencing any other symptoms (ex. SOB, nausea, vomiting, sweating)? no  3. How long have you been experiencing CP? For bout a week   4. Is your CP continuous or coming and going? Coming and going   5. Have you taken Nitroglycerin? He is out of medications   [facility] patient for 3/7 ?

## 2022-04-23 MED ORDER — NITROGLYCERIN 0.4 MG SL SUBL: 0.4000 mg | SUBLINGUAL_TABLET | SUBLINGUAL | 3 refills | Status: AC | PRN

## 2022-04-25 NOTE — Progress Notes (Unsigned)
Cardiology Clinic Note   Date: 04/29/2022 ID: Phillip [name], DOB 1945-08-15, MRN [id number]  Primary Cardiologist:  Sanda Klein, MD  Patient Profile    Phillip [name] is a 77 y.o. male who presents to the clinic today for complaints of chest pain.  Past medical history significant for: CAD. LHC 11/07/2000 (NSTEMI): Proximal Cx 60 to 70%.  Distal Cx 40%.  Nonobstructive CAD treated medically. LHC 06/03/2006 (positive CTA): Proximal RCA 30%.  Mid RCA 40%.  Mid to distal Cx 30%.  Ostial RI 90%.  PCI with stent to RI. LHC 06/18/2008 (unstable angina): Ostial LM 40%.  Proximal LAD 60%.  Mid LAD 60%.  RI proximal in-stent restenosis 40%.  Mid RI 20%.  Proximal OM1 80%.  Proximal RCA 40%.  Mid RCA 40%.  PCI with DES to OM1. Nuclear stress test 06/21/2017: Small inferior wall infarct from apex to base with no ischemia.  EF 45% with apical hypokinesis. Echo 08/14/2018: EF 60 to 65%.  Moderately increased LV wall thickness.  Mild thickening/calcification of aortic valve. Hypertension. Hyperlipidemia. Lipid panel 12/04/2021: LDL 64, HDL 60, TG 126, total 150. T2DM. A1c 12/04/2021: 6.9. GERD. Alcohol use.   History of Present Illness    Phillip [name] is a long time patient of cardiology.  He is followed by Dr. Sallyanne Kuster for the above outlined history.  Patient was last seen in the office by Coletta Memos, NP on 11/30/2019.  At that time patient complained of chest discomfort after fishing and lifting heavy gear with his right arm.  Pain is not exertional but occurs with movement and deep palpation.  Given musculoskeletal nature of chest pain patient was instructed to rest, apply cool compresses, and take Tylenol as needed for pain.  No medication changes were made at that time.  Today, patient reports for the past several months he has experienced central chest tightness with exertion.  Patient states he is very active playing golf several times a week, performing yard work, cutting down trees, and  performing other household duties.  He states several times a week while doing these activities he experiences tightness in the center of his chest for which he she was up a baby aspirin and it resolved in about 10 to 15 minutes.  Pain is nonradiating and not associated with any other symptoms including shortness of breath.  Pain does not occur at rest and is not reproducible with palpation.  He does not know if this is his anginal equivalent pain.  He has not tried to treat his symptoms with nitroglycerin secondary to it expiring.  He has since refilled NTG but has not attempted to use it during symptoms.  Patient reports drinking 4-6 beers daily.  Patient denies lower extremity edema, orthopnea, or PND.   ROS: All other systems reviewed and are otherwise negative except as noted in History of Present Illness.  Studies Reviewed    ECG personally reviewed by me today: Sinus bradycardia, 56 bpm, nonspecific ST abnormality.  No significant changes from 09/25/2021.  Physical Exam    VS:  BP 120/62   Pulse (!) 56   Ht '5\' 10"'$  (1.778 m)   Wt 155 lb 12.8 oz (70.7 kg)   SpO2 97%   BMI 22.35 kg/m  , BMI Body mass index is 22.35 kg/m.  GEN: Well nourished, well developed, in no acute distress. Neck: No JVD or carotid bruits. Cardiac: RRR. No murmurs. No rubs or gallops.   Respiratory:  Respirations regular and unlabored. Clear  to auscultation without rales, wheezing or rhonchi. GI: Soft, nontender, nondistended. Extremities: Radials/DP/PT 2+ and equal bilaterally. No clubbing or cyanosis. No edema.  Skin: Warm and dry, no rash. Neuro: Strength intact.  Assessment & Plan   Exertional chest pain/CAD.  S/p PCI with stent to RI April 2008, PCI with DES to [facility] April 2010.  Nuclear stress test April 2019 showed no ischemia.  Echo June 2020 showed EF 60 to 65%.  Patient reports a several month history of exertional central chest tightness that improves after chewing and aspirin.  Chest tightness is not  associated with any other symptoms including shortness of breath.  No discomfort at rest.  Continue aspirin, carvedilol, rosuvastatin, as needed SL NTG.  Will get a nuclear stress test to further evaluate. Hypertension.  BP today 120/62.  Patient denies headaches or dizziness.  Continue amlodipine, carvedilol, ramipril. Hyperlipidemia.  LDL October 2023 64, at goal.  Continue rosuvastatin.    Disposition: Nuclear stress test.  BMP today.  Return in 2 months or sooner as needed.     Shared Decision Making/Informed Consent The risks [chest pain, shortness of breath, cardiac arrhythmias, dizziness, blood pressure fluctuations, myocardial infarction, stroke/transient ischemic attack, nausea, vomiting, allergic reaction, radiation exposure, metallic taste sensation and life-threatening complications (estimated to be 1 in 10,000)], benefits (risk stratification, diagnosing coronary artery disease, treatment guidance) and alternatives of a nuclear stress test were discussed in detail with Phillip [name] and he agrees to proceed.   Signed, Justice Britain. Jahnavi Muratore, DNP, NP-C

## 2022-04-29 ENCOUNTER — Encounter: Payer: Self-pay | Admitting: Student

## 2022-04-29 ENCOUNTER — Ambulatory Visit: Payer: Medicare Other | Attending: Student | Admitting: Student

## 2022-04-29 VITALS — BP 120/62 | HR 56 | Ht 70.0 in | Wt 155.8 lb

## 2022-04-29 DIAGNOSIS — I25118 Atherosclerotic heart disease of native coronary artery with other forms of angina pectoris: Secondary | ICD-10-CM | POA: Diagnosis not present

## 2022-04-29 DIAGNOSIS — I10 Essential (primary) hypertension: Secondary | ICD-10-CM

## 2022-04-29 DIAGNOSIS — R079 Chest pain, unspecified: Secondary | ICD-10-CM | POA: Diagnosis not present

## 2022-04-29 DIAGNOSIS — E785 Hyperlipidemia, unspecified: Secondary | ICD-10-CM | POA: Diagnosis not present

## 2022-04-29 NOTE — Patient Instructions (Signed)
Medication Instructions:  Your physician recommends that you continue on your current medications as directed. Please refer to the Current Medication list given to you today.  *If you need a refill on your cardiac medications before your next appointment, please call your pharmacy*   Lab Work: Your physician recommends that you have the following lab drawn today: BMET  If you have labs (blood work) drawn today and your tests are completely normal, you will receive your results only by: MyChart Message (if you have MyChart) OR A paper copy in the mail If you have any lab test that is abnormal or we need to change your treatment, we will call you to review the results.   Testing/Procedures: Your physician has requested that you have a lexiscan myoview. For further information please visit [email or web address]. Please follow instruction sheet, as given.    Follow-Up: At [facility], you and your health needs are our priority.  As part of our continuing mission to provide you with exceptional heart care, we have created designated Provider Care Teams.  These Care Teams include your primary Cardiologist (physician) and Advanced Practice Providers (APPs -  Physician Assistants and Nurse Practitioners) who all work together to provide you with the care you need, when you need it.  We recommend signing up for the patient portal called "MyChart".  Sign up information is provided on this After Visit Summary.  MyChart is used to connect with patients for Virtual Visits (Telemedicine).  Patients are able to view lab/test results, encounter notes, upcoming appointments, etc.  Non-urgent messages can be sent to your provider as well.   To learn more about what you can do with MyChart, go to [email or web address].    Your next appointment:   2 month(s)  Provider:   Mayra Reel, NP      Other Instructions How to Prepare for Your Myoview Test (stress test):  Please do not take these  medications before your test: Carvedilol (please note if this is an exercise test pt should hold beta blocker prior) Your remaining medications may be taken with water. Nothing to eat or drink, except water, 4 hours prior to arrival time.  NO caffeine/decaffeinated products, or chocolate 12 hours prior to arrival. Lillian, please do not wear dresses.  Skirts or pants are approprate, please wear a short sleeve shirt. NO perfume, cologne or lotion Wear comfortable walking shoes.  NO HEELS! Total time is 3 to 4 hours; you may want to bring reading material for the waiting time. Please report to [facility] for your test  What to expect after you arrive:  Once you arrive and check in for your appointment an IV will be started in your arm.  Then the Technoligist will inject a small amount of radioactive tracer.  There will be a 1 hour waiting period after this injection.  A series of pictures will be taken of your heart following this waiting period.  You will be prepped for the stress portion of the test.  During the stress portion of your test you will either walk on a treadmill or receive a small, safe amount of radioactive tracer injected in your IV.  After the stress portion, there is a short rest period during which time your heart and blood pressure will be monitored.  After the short rest period the Technologist will begin your second set of pictures.  Your doctor will inform you of your test results within 7-10 business days.  In preparation for your appointment, medication  and supplies will be purchased.  Appointment availability is limited, so if you need to cancel or reschedule please call the office at [phone] 24 hours in advance to avoid a cancellation fee of $100.00

## 2022-04-30 LAB — BASIC METABOLIC PANEL
BUN/Creatinine Ratio: 15 (ref 10–24)
BUN: 11 mg/dL (ref 8–27)
CO2: 25 mmol/L (ref 20–29)
Calcium: 9.4 mg/dL (ref 8.6–10.2)
Chloride: 96 mmol/L (ref 96–106)
Creatinine, Ser: 0.75 mg/dL — ABNORMAL LOW (ref 0.76–1.27)
Glucose: 227 mg/dL — ABNORMAL HIGH (ref 70–99)
Potassium: 4.3 mmol/L (ref 3.5–5.2)
Sodium: 136 mmol/L (ref 134–144)
eGFR: 93 mL/min/{1.73_m2} (ref >59)

## 2022-04-30 NOTE — Telephone Encounter (Signed)
Patient seen in office on 04/29/22- please see that encounter.

## 2022-05-05 ENCOUNTER — Encounter (HOSPITAL_COMMUNITY): Payer: Medicare Other

## 2022-05-05 ENCOUNTER — Encounter (HOSPITAL_COMMUNITY): Payer: Self-pay | Admitting: Student

## 2022-05-07 ENCOUNTER — Ambulatory Visit (HOSPITAL_COMMUNITY): Payer: Medicare Other | Attending: Cardiology

## 2022-05-07 DIAGNOSIS — R079 Chest pain, unspecified: Secondary | ICD-10-CM | POA: Insufficient documentation

## 2022-05-07 LAB — MYOCARDIAL PERFUSION IMAGING
LV dias vol: 116 mL (ref 62–150)
LV sys vol: 59 mL
Nuc Stress EF: 49 %
Peak HR: 68 {beats}/min
Rest HR: 58 {beats}/min
Rest Nuclear Isotope Dose: 10.9 mCi
SDS: 0
SRS: 0
SSS: 0
ST Depression (mm): 0 mm
Stress Nuclear Isotope Dose: 30.1 mCi
TID: 1.08

## 2022-05-07 MED ORDER — TECHNETIUM TC 99M TETROFOSMIN IV KIT
10.9000 | PACK | Freq: Once | INTRAVENOUS | Status: AC | PRN
  Administered 2022-05-07: 10.9 via INTRAVENOUS

## 2022-05-07 MED ORDER — REGADENOSON 0.4 MG/5ML IV SOLN
0.4000 mg | Freq: Once | INTRAVENOUS | Status: AC
  Administered 2022-05-07: 0.4 mg via INTRAVENOUS

## 2022-05-07 MED ORDER — TECHNETIUM TC 99M TETROFOSMIN IV KIT
30.1000 | PACK | Freq: Once | INTRAVENOUS | Status: AC | PRN
  Administered 2022-05-07: 30.1 via INTRAVENOUS

## 2022-05-10 ENCOUNTER — Other Ambulatory Visit: Payer: Self-pay | Admitting: Cardiovascular Disease

## 2022-05-15 ENCOUNTER — Other Ambulatory Visit: Payer: Self-pay | Admitting: Internal Medicine

## 2022-05-20 ENCOUNTER — Other Ambulatory Visit: Payer: Self-pay | Admitting: Internal Medicine

## 2022-05-31 ENCOUNTER — Telehealth: Payer: Self-pay

## 2022-05-31 NOTE — Telephone Encounter (Signed)
Called patient to schedule Medicare Annual Wellness Visit (AWV). Left message for patient to call back and schedule Medicare Annual Wellness Visit (AWV).  Last date of AWV: eligible 03/25/10 for AWV-I  Please schedule an appointment at any time with [facility] on wellness schedule.    Agnes Lawrence, CMA (AAMA)  [facility]- AWV Program

## 2022-06-07 ENCOUNTER — Ambulatory Visit (INDEPENDENT_AMBULATORY_CARE_PROVIDER_SITE_OTHER): Payer: Medicare Other | Admitting: Internal Medicine

## 2022-06-07 VITALS — BP 118/62 | HR 58 | Temp 97.8°F | Ht 70.0 in | Wt 158.0 lb

## 2022-06-07 DIAGNOSIS — E782 Mixed hyperlipidemia: Secondary | ICD-10-CM

## 2022-06-07 DIAGNOSIS — E1165 Type 2 diabetes mellitus with hyperglycemia: Secondary | ICD-10-CM

## 2022-06-07 DIAGNOSIS — E559 Vitamin D deficiency, unspecified: Secondary | ICD-10-CM | POA: Diagnosis not present

## 2022-06-07 DIAGNOSIS — E538 Deficiency of other specified B group vitamins: Secondary | ICD-10-CM | POA: Diagnosis not present

## 2022-06-07 DIAGNOSIS — K5730 Diverticulosis of large intestine without perforation or abscess without bleeding: Secondary | ICD-10-CM | POA: Insufficient documentation

## 2022-06-07 DIAGNOSIS — Z0001 Encounter for general adult medical examination with abnormal findings: Secondary | ICD-10-CM | POA: Diagnosis not present

## 2022-06-07 DIAGNOSIS — I10 Essential (primary) hypertension: Secondary | ICD-10-CM | POA: Diagnosis not present

## 2022-06-07 DIAGNOSIS — Z125 Encounter for screening for malignant neoplasm of prostate: Secondary | ICD-10-CM

## 2022-06-07 DIAGNOSIS — Z1211 Encounter for screening for malignant neoplasm of colon: Secondary | ICD-10-CM

## 2022-06-07 LAB — MICROALBUMIN / CREATININE URINE RATIO
Creatinine,U: 32.2 mg/dL
Microalb Creat Ratio: 2.2 mg/g (ref 0.0–30.0)
Microalb, Ur: <0.7 mg/dL (ref 0.0–1.9)

## 2022-06-07 LAB — CBC WITH DIFFERENTIAL/PLATELET
Basophils Absolute: 0.1 10*3/uL (ref 0.0–0.1)
Basophils Relative: 1.6 % (ref 0.0–3.0)
Eosinophils Absolute: 0.1 10*3/uL (ref 0.0–0.7)
Eosinophils Relative: 1.9 % (ref 0.0–5.0)
HCT: 40.9 % (ref 39.0–52.0)
Hemoglobin: 13.4 g/dL (ref 13.0–17.0)
Lymphocytes Relative: 19.3 % (ref 12.0–46.0)
Lymphs Abs: 1.1 10*3/uL (ref 0.7–4.0)
MCHC: 32.7 g/dL (ref 30.0–36.0)
MCV: 90.6 fl (ref 78.0–100.0)
Monocytes Absolute: 0.4 10*3/uL (ref 0.1–1.0)
Monocytes Relative: 7.2 % (ref 3.0–12.0)
Neutro Abs: 4.1 10*3/uL (ref 1.4–7.7)
Neutrophils Relative %: 70 % (ref 43.0–77.0)
Platelets: 273 10*3/uL (ref 150.0–400.0)
RBC: 4.51 Mil/uL (ref 4.22–5.81)
RDW: 13.7 % (ref 11.5–15.5)
WBC: 5.9 10*3/uL (ref 4.0–10.5)

## 2022-06-07 LAB — HEPATIC FUNCTION PANEL
ALT: 21 U/L (ref 0–53)
AST: 24 U/L (ref 0–37)
Albumin: 4.2 g/dL (ref 3.5–5.2)
Alkaline Phosphatase: 54 U/L (ref 39–117)
Bilirubin, Direct: 0.1 mg/dL (ref 0.0–0.3)
Total Bilirubin: 0.3 mg/dL (ref 0.2–1.2)
Total Protein: 6.6 g/dL (ref 6.0–8.3)

## 2022-06-07 LAB — BASIC METABOLIC PANEL
BUN: 12 mg/dL (ref 6–23)
CO2: 28 mEq/L (ref 19–32)
Calcium: 9.1 mg/dL (ref 8.4–10.5)
Chloride: 98 mEq/L (ref 96–112)
Creatinine, Ser: 0.7 mg/dL (ref 0.40–1.50)
GFR: 89.12 mL/min (ref >60.00)
Glucose, Bld: 210 mg/dL — ABNORMAL HIGH (ref 70–99)
Potassium: 4.2 mEq/L (ref 3.5–5.1)
Sodium: 136 mEq/L (ref 135–145)

## 2022-06-07 LAB — URINALYSIS, ROUTINE W REFLEX MICROSCOPIC
Bilirubin Urine: NEGATIVE
Hgb urine dipstick: NEGATIVE
Ketones, ur: NEGATIVE
Leukocytes,Ua: NEGATIVE
Nitrite: NEGATIVE
RBC / HPF: NONE SEEN (ref 0–?)
Specific Gravity, Urine: 1.015 (ref 1.000–1.030)
Total Protein, Urine: NEGATIVE
Urine Glucose: >=1000 — AB
Urobilinogen, UA: 0.2 (ref 0.0–1.0)
WBC, UA: NONE SEEN (ref 0–?)
pH: 5.5 (ref 5.0–8.0)

## 2022-06-07 LAB — PSA: PSA: 0.64 ng/mL (ref 0.10–4.00)

## 2022-06-07 LAB — LIPID PANEL
Cholesterol: 166 mg/dL (ref 0–200)
HDL: 85.9 mg/dL (ref >39.00)
LDL Cholesterol: 65 mg/dL (ref 0–99)
NonHDL: 80.18
Total CHOL/HDL Ratio: 2
Triglycerides: 74 mg/dL (ref 0.0–149.0)
VLDL: 14.8 mg/dL (ref 0.0–40.0)

## 2022-06-07 LAB — TSH: TSH: 1.98 u[IU]/mL (ref 0.35–5.50)

## 2022-06-07 LAB — VITAMIN D 25 HYDROXY (VIT D DEFICIENCY, FRACTURES): VITD: 49.2 ng/mL (ref 30.00–100.00)

## 2022-06-07 LAB — HEMOGLOBIN A1C: Hgb A1c MFr Bld: 7.2 % — ABNORMAL HIGH (ref 4.6–6.5)

## 2022-06-07 LAB — VITAMIN B12: Vitamin B-12: >1500 pg/mL — ABNORMAL HIGH (ref 211–911)

## 2022-06-07 NOTE — Assessment & Plan Note (Signed)
Last vitamin D Lab Results  Component Value Date   VD25OH 86.28 12/04/2021   Stable, cont oral replacement  

## 2022-06-07 NOTE — Patient Instructions (Signed)
Please continue all other medications as before, and refills have been done if requested.  Please have the pharmacy call with any other refills you may need.  Please continue your efforts at being more active, low cholesterol diet, and weight control.  You are otherwise up to date with prevention measures today.  Please keep your appointments with your specialists as you may have planned  You will be contacted regarding the referral for: colonoscopy  Please go to the LAB at the blood drawing area for the tests to be done  You will be contacted by phone if any changes need to be made immediately.  Otherwise, you will receive a letter about your results with an explanation, but please check with MyChart first.  Please remember to sign up for MyChart if you have not done so, as this will be important to you in the future with finding out test results, communicating by private email, and scheduling acute appointments online when needed.  Please make an Appointment to return in 6 months, or sooner if needed 

## 2022-06-07 NOTE — Progress Notes (Signed)
The test results show that your current treatment is OK, as the tests are stable.  Please continue the same plan.  There is no other need for change of treatment or further evaluation based on these results, at this time.  thanks 

## 2022-06-07 NOTE — Progress Notes (Unsigned)
Patient ID: Phillip [name], male   DOB: 1945/12/20, 77 y.o.   MRN: [id number]         Chief Complaint:: wellness exam and Medical Management of Chronic Issues (No concerns or questions)  ***       HPI:  Phillip [name] is a 77 y.o. male here for wellness exam; due for colonoscopy has not had in past but now ok;                         Also***   Wt Readings from Last 3 Encounters:  06/07/22 158 lb (71.7 kg)  05/07/22 155 lb (70.3 kg)  04/29/22 155 lb 12.8 oz (70.7 kg)   BP Readings from Last 3 Encounters:  06/07/22 118/62  04/29/22 120/62  12/04/21 138/68   Immunization History  Administered Date(s) Administered   Fluad Quad(high Dose 65+) 12/08/2018, 03/03/2020, 03/09/2021, 12/04/2021   Influenza, High Dose Seasonal PF 01/16/2013, 01/24/2015, 11/11/2016, 11/17/2017   Influenza,inj,Quad PF,6+ Mos 02/27/2014, 12/02/2015   Pneumococcal Conjugate-13 06/01/2016   Pneumococcal Polysaccharide-23 11/17/2017   Zoster Recombinat (Shingrix) 12/08/2018, 05/28/2019   Health Maintenance Due  Topic Date Due   DTaP/Tdap/Td (1 - Tdap) Never done   OPHTHALMOLOGY EXAM  03/04/2022   Diabetic kidney evaluation - Urine ACR  03/09/2022   FOOT EXAM  03/09/2022   HEMOGLOBIN A1C  06/05/2022      Past Medical History:  Diagnosis Date   Anemia    Anxiety    Arthritis    Bipolar depression (HCC)    CAD, multiple vessel    Cancer (HCC)    skin, basal   Chronic pain disorder    chronic bilat shoulder pain and feet pain   Depression    Diabetes mellitus without complication (HCC)    Erectile dysfunction 01/17/2013   GERD (gastroesophageal reflux disease)    History of colon polyps    Hyperlipidemia    Hypertension    Insomnia    Iron deficiency anemia 01/16/2013   Mixed hyperlipidemia 03/08/2013   Posterior neck pain 01/22/2019   Status post insertion of drug-eluting stent into left anterior descending (LAD) artery for coronary artery disease    Stented coronary artery    X 2   Systolic  murmur    Unspecified vitamin D deficiency 01/17/2013   Past Surgical History:  Procedure Laterality Date   CAROTID STENT  2008   CIRCUMCISION N/A 09/30/2021   Procedure: CIRCUMCISION ADULT;  Surgeon: Despina Arias, MD;  Location: [facility];  Service: Urology;  Laterality: N/A;   CORONARY ANGIOPLASTY WITH STENT PLACEMENT  about 2014   current Cardiology: Dr Jomarie Longs   EYE SURGERY      reports that he quit smoking about 20 years ago. His smoking use included cigarettes. He has a 20.00 pack-year smoking history. His smokeless tobacco use includes chew. He reports current alcohol use. He reports that he does not use drugs. family history includes Alzheimer's disease in his brother; Cancer in his maternal grandfather, maternal grandmother, and mother; Heart disease in his maternal grandmother, mother, and paternal grandfather; Hyperlipidemia in his maternal grandmother; Lung cancer in his father; Stroke in his maternal grandmother and paternal grandfather. Allergies  Allergen Reactions   Lipitor [Atorvastatin] Other (See Comments)    Myalgia, was unable to walk per Pt.    Current Outpatient Medications on File Prior to Visit  Medication Sig Dispense Refill   ALPRAZolam (XANAX) 1 MG tablet TAKE 1 TABLET BY MOUTH  TWICE DAILY AS NEEDED 60 tablet 2   amLODipine (NORVASC) 5 MG tablet TAKE 1 TABLET BY MOUTH DAILY IN THE MORNING AND AT BEDTIME 180 tablet 3   aspirin 81 MG tablet Take 81 mg by mouth daily.     carbamazepine (TEGRETOL) 200 MG tablet TAKE 1 TABLET BY MOUTH TWICE DAILY 180 tablet 1   carvedilol (COREG) 12.5 MG tablet TAKE 1 TABLET BY MOUTH TWICE DAILY WITH A MEAL 90 tablet 3   clobetasol cream (TEMOVATE) 0.05 % APPLY TOPICALLY TWICE DAILY AS NEEDED 60 g 1   Clobetasol Prop Crea-Coal Tar (CLOBETA CREAM EX) Clobeta Cream     Cyanocobalamin (VITAMIN B 12 PO) Take 1 capsule by mouth in the morning and at bedtime.     FARXIGA 10 MG TABS tablet TAKE 1 TABLET BY MOUTH DAILY BEFORE BREAKFAST 90  tablet 3   ferrous sulfate 325 (65 FE) MG EC tablet Take 1 tablet (325 mg total) by mouth 2 (two) times a day. (Patient taking differently: Take 2 tablets by mouth 2 (two) times a day.) 60 tablet 5   folic acid (FOLVITE) 1 MG tablet TAKE 1 TABLET BY MOUTH DAILY (Patient taking differently: Take 1 mg by mouth in the morning and at bedtime.) 90 tablet 0   HYDROcodone-acetaminophen (NORCO/VICODIN) 5-325 MG tablet Take 1 tablet by mouth every 6 (six) hours as needed for severe pain. 16 tablet 0   hydroxychloroquine (PLAQUENIL) 200 MG tablet TAKE 2 TABLETS BY MOUTH DAILY (Patient taking differently: Take 200 mg by mouth daily.) 60 tablet 5   hydrOXYzine (ATARAX) 25 MG tablet TAKE 1 TABLET BY MOUTH 3 TIMES DAILY AS NEEDED 270 tablet 3   ibuprofen (ADVIL) 200 MG tablet Take 400 mg by mouth daily as needed (pain).     leflunomide (ARAVA) 20 MG tablet Take 40 mg by mouth at bedtime.     metFORMIN (GLUCOPHAGE-XR) 500 MG 24 hr tablet TAKE 1 TABLET BY MOUTH DAILY WITH BREAKFAST 90 tablet 2   nitroGLYCERIN (NITROSTAT) 0.4 MG SL tablet Place 1 tablet (0.4 mg total) under the tongue every 5 (five) minutes as needed for chest pain. 25 tablet 3   Omega-3 Fatty Acids (FISH OIL PO) Take 1 capsule by mouth daily.     OVER THE COUNTER MEDICATION Apply 1 Application topically as needed (pain). CBD oil     pantoprazole (PROTONIX) 40 MG tablet TAKE 1 TABLET BY MOUTH TWICE DAILY 180 tablet 1   predniSONE (DELTASONE) 10 MG tablet TAKE 1 TABLET BY MOUTH DAILY 90 tablet 1   ramipril (ALTACE) 5 MG capsule TAKE 1 CAPSULE BY MOUTH DAILY 90 capsule 2   rosuvastatin (CRESTOR) 20 MG tablet TAKE 1 TABLET BY MOUTH DAILY 90 tablet 3   sildenafil (REVATIO) 20 MG tablet Take 3 pills daily as needed 60 tablet 2   TADALAFIL PO Take 1 tablet by mouth as needed (sexual disfunction).     tamsulosin (FLOMAX) 0.4 MG CAPS capsule TAKE 1 CAPSULE BY MOUTH DAILY 90 capsule 0   traZODone (DESYREL) 50 MG tablet TAKE 1 TABLET BY MOUTH EACH NIGHT AT  BEDTIME 90 tablet 3   triamcinolone (NASACORT AQ) 55 MCG/ACT AERO nasal inhaler Place 2 sprays into the nose daily. 1 Inhaler 12   clopidogrel (PLAVIX) 75 MG tablet 1 tablet Orally Once a day     fenofibrate 54 MG tablet 1 tablet with a meal Orally Once a day     oxyCODONE-acetaminophen (PERCOCET/ROXICET) 5-325 MG tablet 1 tablet as needed Orally  every 6 hrs     pravastatin (PRAVACHOL) 80 MG tablet 1 tablet Orally Once a day     No current facility-administered medications on file prior to visit.        ROS:  All others reviewed and negative.  Objective        PE:  BP 118/62   Pulse (!) 58   Temp 97.8 F (36.6 C) (Oral)   Ht 5\' 10"  (1.778 m)   Wt 158 lb (71.7 kg)   SpO2 95%   BMI 22.67 kg/m                 Constitutional: Pt appears in NAD               HENT: Head: NCAT.                Right Ear: External ear normal.                 Left Ear: External ear normal.                Eyes: . Pupils are equal, round, and reactive to light. Conjunctivae and EOM are normal               Nose: without d/c or deformity               Neck: Neck supple. Gross normal ROM               Cardiovascular: Normal rate and regular rhythm.                 Pulmonary/Chest: Effort normal and breath sounds without rales or wheezing.                Abd:  Soft, NT, ND, + BS, no organomegaly               Neurological: Pt is alert. At baseline orientation, motor grossly intact               Skin: Skin is warm. No rashes, no other new lesions, LE edema - ***               Psychiatric: Pt behavior is normal without agitation   Micro: none  Cardiac tracings I have personally interpreted today:  none  Pertinent Radiological findings (summarize): none   Lab Results  Component Value Date   WBC 8.9 09/25/2021   HGB 13.0 09/25/2021   HCT 39.9 09/25/2021   PLT 328 09/25/2021   GLUCOSE 227 (H) 04/29/2022   CHOL 150 12/04/2021   TRIG 126.0 12/04/2021   HDL 60.00 12/04/2021   LDLDIRECT 142.5 02/27/2014    LDLCALC 64 12/04/2021   ALT 17 12/04/2021   AST 22 12/04/2021   NA 136 04/29/2022   K 4.3 04/29/2022   CL 96 04/29/2022   CREATININE 0.75 (L) 04/29/2022   BUN 11 04/29/2022   CO2 25 04/29/2022   TSH 1.71 03/09/2021   PSA 0.58 03/09/2021   INR 1.0 09/15/2008   HGBA1C 6.9 (H) 12/04/2021   MICROALBUR <0.7 03/09/2021   Assessment/Plan:  Phillip [name] is a 77 y.o. White or Caucasian [1] male with  has a past medical history of Anemia, Anxiety, Arthritis, Bipolar depression (HCC), CAD, multiple vessel, Cancer (HCC), Chronic pain disorder, Depression, Diabetes mellitus without complication (HCC), Erectile dysfunction (01/17/2013), GERD (gastroesophageal reflux disease), History of colon polyps, Hyperlipidemia, Hypertension, Insomnia, Iron deficiency anemia (01/16/2013), Mixed hyperlipidemia (03/08/2013), Posterior neck pain (  01/22/2019), Status post insertion of drug-eluting stent into left anterior descending (LAD) artery for coronary artery disease, Stented coronary artery, Systolic murmur, and Unspecified vitamin D deficiency (01/17/2013).  No problem-specific Assessment & Plan notes found for this encounter.  Followup: No follow-ups on file.  Oliver Barre, MD 06/07/2022 9:28 AM [facility]

## 2022-06-08 ENCOUNTER — Encounter: Payer: Self-pay | Admitting: Internal Medicine

## 2022-06-08 NOTE — Assessment & Plan Note (Signed)
Age and sex appropriate education and counseling updated with regular exercise and diet Referrals for preventative services - for colonoscopy Immunizations addressed - for tdap at the pharmacy Smoking counseling  - none needed Evidence for depression or other mood disorder - none significant Most recent labs reviewed. I have personally reviewed and have noted: 1) the patient's medical and social history 2) The patient's current medications and supplements 3) The patient's height, weight, and BMI have been recorded in the chart

## 2022-06-08 NOTE — Assessment & Plan Note (Signed)
Lab Results  Component Value Date   HGBA1C 7.2 (H) 06/07/2022   Mild uncontrolled, pt to continue current medical treatment farxiga 10 qqd, metformin Er 500 mg - 1 qd, and improved diet, declines other change for now

## 2022-06-08 NOTE — Assessment & Plan Note (Signed)
Lab Results  Component Value Date   LDLCALC 65 06/07/2022   Stable, pt to continue current statin crestor 20 mg qd

## 2022-06-08 NOTE — Assessment & Plan Note (Signed)
BP Readings from Last 3 Encounters:  06/07/22 118/62  04/29/22 120/62  12/04/21 138/68   Stable, pt to continue medical treatment norvasc 5 mg qd, coreg 12.5 bid, altace 5 mg qd

## 2022-06-10 ENCOUNTER — Other Ambulatory Visit: Payer: Self-pay | Admitting: Internal Medicine

## 2022-06-16 ENCOUNTER — Encounter: Payer: Self-pay | Admitting: Family Medicine

## 2022-06-16 ENCOUNTER — Ambulatory Visit (INDEPENDENT_AMBULATORY_CARE_PROVIDER_SITE_OTHER): Payer: Medicare Other | Admitting: Family Medicine

## 2022-06-16 VITALS — BP 118/62 | HR 62 | Temp 97.6°F | Ht 70.0 in | Wt 160.0 lb

## 2022-06-16 DIAGNOSIS — J309 Allergic rhinitis, unspecified: Secondary | ICD-10-CM | POA: Diagnosis not present

## 2022-06-16 DIAGNOSIS — J029 Acute pharyngitis, unspecified: Secondary | ICD-10-CM | POA: Diagnosis not present

## 2022-06-16 DIAGNOSIS — J069 Acute upper respiratory infection, unspecified: Secondary | ICD-10-CM

## 2022-06-16 DIAGNOSIS — R051 Acute cough: Secondary | ICD-10-CM

## 2022-06-16 MED ORDER — BENZONATATE 200 MG PO CAPS
200.0000 mg | ORAL_CAPSULE | Freq: Two times a day (BID) | ORAL | 0 refills | Status: DC | PRN
Start: 2022-06-16 — End: 2023-11-23

## 2022-06-16 NOTE — Patient Instructions (Signed)
Continue Nasocort inhaler and allergy pill daily.   Wash off pollen and change clothes after you come inside from outdoors.   Use salt water gargles for sore throat.  Ibuprofen or Tylenol for sore throat and other aches and pains  Try over the counter Mucinex or Robitussin for cough.   Try the prescribed Tessalon Perles for cough also.   Drink plenty of water.   Viral illness usually run their course in 7-10 days but let us know if you are getting much worse in the next few days.

## 2022-06-16 NOTE — Progress Notes (Signed)
Subjective:  Phillip [name] is a 77 y.o. male who presents for a 3 day hx of cough productive of white sputum, ST, chest discomfort with cough only.   Denies fever, chills, dizziness, palpitations, shortness of breath, abdominal pain, N/V/D.   He has taken equate.  Homemade cough syrup also.   Stopped smoking 30 years ago.  No hx of lung disease.    No other aggravating or relieving factors.  No other c/o.  ROS as in subjective.   Objective: Vitals:   06/16/22 0834  BP: 118/62  Pulse: 62  Temp: 97.6 F (36.4 C)  SpO2: 98%    General appearance: Alert, WD/WN, no distress                             Skin: warm, no rash                           Head: no sinus tenderness                            Eyes: conjunctiva normal, corneas clear, PERRLA                            Ears: pearly TMs, external ear canals normal                          Nose: septum midline, turbinates swollen, with erythema and clear discharge             Mouth/throat: MMM, tongue normal, mild pharyngeal erythema                           Neck: supple, no adenopathy, no thyromegaly, nontender                          Heart: RRR                         Lungs: CTA bilaterally, no wheezes, rales, or rhonchi      Assessment: Acute URI  Allergic rhinitis, unspecified seasonality, unspecified trigger  Acute cough - Plan: benzonatate (TESSALON) 200 MG capsule  Acute pharyngitis, unspecified etiology   Plan: Discussed diagnosis and treatment of viral URI and allergies. Onset of symptoms 3 days ago.  Suggested symptomatic OTC remedies for cough, ST and allergies. He has Nasocort and allergy pill at home. Using homemade remedies as well such as cough syrup.  Nasal saline spray for congestion.  Tylenol or Ibuprofen OTC prn.   Counseling on why antibiotic therapy is not needed at this point.  Call/return in 4-6 days if symptoms aren't resolving or if worsening.

## 2022-06-24 NOTE — Progress Notes (Deleted)
   Cardiology Clinic Note   Date: 06/24/2022 ID: UNDREA SHIPES, DOB August 30, 1945, MRN [id number]  Primary Cardiologist:  Thurmon Fair, MD  Patient Profile    ISRRAEL FLUCKIGER is a 77 y.o. male who presents to the clinic today for follow-up after testing.  Past medical history significant for: CAD. LHC 11/07/2000 (NSTEMI): Proximal Cx 60 to 70%.  Distal Cx 40%.  Nonobstructive CAD treated medically. LHC 06/03/2006 (positive CTA): Proximal RCA 30%.  Mid RCA 40%.  Mid to distal Cx 30%.  Ostial RI 90%.  PCI with stent to RI. LHC 06/18/2008 (unstable angina): Ostial LM 40%.  Proximal LAD 60%.  Mid LAD 60%.  RI proximal in-stent restenosis 40%.  Mid RI 20%.  Proximal OM1 80%.  Proximal RCA 40%.  Mid RCA 40%.  PCI with DES to OM1. Echo 08/14/2018: EF 60 to 65%.  Moderately increased LV wall thickness.  Mild thickening/calcification of aortic valve. Nuclear stress test 05/07/2022: Normal nuclear stress study with inferior thinning but no ischemia. Hypertension. Hyperlipidemia. Lipid panel 06/07/2022: LDL 65, HDL 86, TG 74, total 166. T2DM. A1c 12/04/2021: 6.9. GERD. Alcohol use.   History of Present Illness    AARION METZGAR  is a long time patient of cardiology.  He is followed by Dr. Royann Shivers for the above outlined history.   Patient was last seen in the office by me on 04/29/2022 with complaints of exertional central chest tightness for several months.  Pain does not occur with rest and resolves within 10 to 15 minutes of taking a baby aspirin.  Patient had not tried to treat pain with NTG.  He underwent nuclear stress testing which was a normal low risk study.  Today, patient ***    Exertional chest pain/CAD.  S/p PCI with stent to RI April 2008, PCI with DES to OM1 April 2010.  Echo June 2020 showed EF 60 to 65%.  Normal, low risk nuclear stress test March 2024.  Patient*** Continue aspirin, carvedilol, rosuvastatin, as needed SL NTG.   Hypertension.  BP today ***.  Patient denies headaches or  dizziness.  Continue amlodipine, carvedilol, ramipril. Hyperlipidemia.  LDL April 2024 65, at goal.  Continue rosuvastatin.    ROS: All other systems reviewed and are otherwise negative except as noted in History of Present Illness.  Studies Reviewed    ECG personally reviewed by me today: ***  No significant changes from ***  Risk Assessment/Calculations    {Does this patient have ATRIAL FIBRILLATION?:[id number]} No BP recorded.  {Refresh Note OR Click here to enter BP  :1}***        Physical Exam    VS:  There were no vitals taken for this visit. , BMI There is no height or weight on file to calculate BMI.  GEN: Well nourished, well developed, in no acute distress. Neck: No JVD or carotid bruits. Cardiac: *** RRR. No murmurs. No rubs or gallops.   Respiratory:  Respirations regular and unlabored. Clear to auscultation without rales, wheezing or rhonchi. GI: Soft, nontender, nondistended. Extremities: Radials/DP/PT 2+ and equal bilaterally. No clubbing or cyanosis. No edema ***  Skin: Warm and dry, no rash. Neuro: Strength intact.  Assessment & Plan   ***  Disposition: ***     {Are you ordering a CV Procedure (e.g. stress test, cath, DCCV, TEE, etc)?   Press F2        :[id number]}   Signed, Etta Grandchild. Aima Mcwhirt, DNP, NP-C

## 2022-07-02 ENCOUNTER — Ambulatory Visit: Payer: Medicare Other | Attending: Student | Admitting: Student

## 2022-07-07 ENCOUNTER — Ambulatory Visit (INDEPENDENT_AMBULATORY_CARE_PROVIDER_SITE_OTHER): Payer: Medicare Other | Admitting: Internal Medicine

## 2022-07-07 VITALS — BP 136/66 | HR 60 | Temp 97.8°F | Ht 70.0 in | Wt 153.5 lb

## 2022-07-07 DIAGNOSIS — I10 Essential (primary) hypertension: Secondary | ICD-10-CM | POA: Diagnosis not present

## 2022-07-07 DIAGNOSIS — E785 Hyperlipidemia, unspecified: Secondary | ICD-10-CM

## 2022-07-07 DIAGNOSIS — L989 Disorder of the skin and subcutaneous tissue, unspecified: Secondary | ICD-10-CM | POA: Diagnosis not present

## 2022-07-07 DIAGNOSIS — E559 Vitamin D deficiency, unspecified: Secondary | ICD-10-CM

## 2022-07-07 DIAGNOSIS — Z0001 Encounter for general adult medical examination with abnormal findings: Secondary | ICD-10-CM

## 2022-07-07 DIAGNOSIS — Z7984 Long term (current) use of oral hypoglycemic drugs: Secondary | ICD-10-CM

## 2022-07-07 DIAGNOSIS — E1165 Type 2 diabetes mellitus with hyperglycemia: Secondary | ICD-10-CM

## 2022-07-07 MED ORDER — TRAZODONE HCL 50 MG PO TABS: ORAL_TABLET | ORAL | 1 refills | Status: DC

## 2022-07-07 MED ORDER — METFORMIN HCL ER 500 MG PO TB24: 1000.0000 mg | ORAL_TABLET | Freq: Every day | ORAL | 3 refills | Status: DC

## 2022-07-07 MED ORDER — PANTOPRAZOLE SODIUM 40 MG PO TBEC: 40.0000 mg | DELAYED_RELEASE_TABLET | Freq: Two times a day (BID) | ORAL | 3 refills | Status: DC

## 2022-07-07 MED ORDER — CARBAMAZEPINE 200 MG PO TABS: 200.0000 mg | ORAL_TABLET | Freq: Two times a day (BID) | ORAL | 1 refills | Status: DC

## 2022-07-07 MED ORDER — RAMIPRIL 5 MG PO CAPS: 5.0000 mg | ORAL_CAPSULE | Freq: Every day | ORAL | 3 refills | Status: DC

## 2022-07-07 MED ORDER — TAMSULOSIN HCL 0.4 MG PO CAPS: 0.4000 mg | ORAL_CAPSULE | Freq: Every day | ORAL | 3 refills | Status: DC

## 2022-07-07 MED ORDER — PREDNISONE 10 MG PO TABS: 10.0000 mg | ORAL_TABLET | Freq: Every day | ORAL | 1 refills | Status: DC

## 2022-07-07 MED ORDER — ROSUVASTATIN CALCIUM 20 MG PO TABS: 20.0000 mg | ORAL_TABLET | Freq: Every day | ORAL | 3 refills | Status: DC

## 2022-07-07 MED ORDER — DAPAGLIFLOZIN PROPANEDIOL 10 MG PO TABS: 10.0000 mg | ORAL_TABLET | Freq: Every day | ORAL | 3 refills | Status: DC

## 2022-07-07 MED ORDER — CARVEDILOL 12.5 MG PO TABS: 12.5000 mg | ORAL_TABLET | Freq: Two times a day (BID) | ORAL | 3 refills | Status: DC

## 2022-07-07 NOTE — Patient Instructions (Addendum)
You will be contacted regarding the referral for: dermatology   Ok to increase the metformin ER 500 mg to 2 pills in the AM  Please continue all other medications as before, and refills have been done if requested - just let us know if we missed any refills needed  Please have the pharmacy call with any other refills you may need.  Please keep your appointments with your specialists as you may have planned  Please make an Appointment to return in Oct 7, or sooner if needed

## 2022-07-07 NOTE — Progress Notes (Unsigned)
Patient ID: Phillip [name], male   DOB: 11-25-1945, 77 y.o.   MRN: [id number]        Chief Complaint: follow up right facial lesion, dm, htn, low vit d       HPI:  Phillip [name] is a 77 y.o. male here with right temple/facial lesion with small central open area, reddish, slightly raised, non tender without healing for 6 months, getting larger.  No hx of skin ca.  Pt denies chest pain, increased sob or doe, wheezing, orthopnea, PND, increased LE swelling, palpitations, dizziness or syncope.   Pt denies polydipsia, polyuria, or new focal neuro s/s.    Pt denies fever, wt loss, night sweats, loss of appetite, or other constitutional symptoms         Wt Readings from Last 3 Encounters:  07/07/22 153 lb 8 oz (69.6 kg)  06/16/22 160 lb (72.6 kg)  06/07/22 158 lb (71.7 kg)   BP Readings from Last 3 Encounters:  07/07/22 136/66  06/16/22 118/62  06/07/22 118/62         Past Medical History:  Diagnosis Date   Anemia    Anxiety    Arthritis    Bipolar depression (HCC)    CAD, multiple vessel    Cancer (HCC)    skin, basal   Chronic pain disorder    chronic bilat shoulder pain and feet pain   Depression    Diabetes mellitus without complication (HCC)    Erectile dysfunction 01/17/2013   GERD (gastroesophageal reflux disease)    History of colon polyps    Hyperlipidemia    Hypertension    Insomnia    Iron deficiency anemia 01/16/2013   Mixed hyperlipidemia 03/08/2013   Posterior neck pain 01/22/2019   Status post insertion of drug-eluting stent into left anterior descending (LAD) artery for coronary artery disease    Stented coronary artery    X 2   Systolic murmur    Unspecified vitamin D deficiency 01/17/2013   Past Surgical History:  Procedure Laterality Date   CAROTID STENT  2008   CIRCUMCISION N/A 09/30/2021   Procedure: CIRCUMCISION ADULT;  Surgeon: Despina Arias, MD;  Location: [facility];  Service: Urology;  Laterality: N/A;   CORONARY ANGIOPLASTY WITH STENT PLACEMENT   about 2014   current Cardiology: Dr Jomarie Longs   EYE SURGERY      reports that he quit smoking about 20 years ago. His smoking use included cigarettes. He has a 20.00 pack-year smoking history. His smokeless tobacco use includes chew. He reports current alcohol use. He reports that he does not use drugs. family history includes Alzheimer's disease in his brother; Cancer in his maternal grandfather, maternal grandmother, and mother; Heart disease in his maternal grandmother, mother, and paternal grandfather; Hyperlipidemia in his maternal grandmother; Lung cancer in his father; Stroke in his maternal grandmother and paternal grandfather. Allergies  Allergen Reactions   Lipitor [Atorvastatin] Other (See Comments)    Myalgia, was unable to walk per Pt.    Current Outpatient Medications on File Prior to Visit  Medication Sig Dispense Refill   ALPRAZolam (XANAX) 1 MG tablet TAKE 1 TABLET BY MOUTH TWICE DAILY AS NEEDED 60 tablet 2   amLODipine (NORVASC) 5 MG tablet TAKE 1 TABLET BY MOUTH DAILY IN THE MORNING AND AT BEDTIME 180 tablet 3   aspirin 81 MG tablet Take 81 mg by mouth daily.     benzonatate (TESSALON) 200 MG capsule Take 1 capsule (200 mg total) by mouth 2 (  two) times daily as needed for cough. 20 capsule 0   clobetasol cream (TEMOVATE) 0.05 % APPLY TOPICALLY TWICE DAILY AS NEEDED 60 g 1   Clobetasol Prop Crea-Coal Tar (CLOBETA CREAM EX) Clobeta Cream     clopidogrel (PLAVIX) 75 MG tablet 1 tablet Orally Once a day     Cyanocobalamin (VITAMIN B 12 PO) Take 1 capsule by mouth in the morning and at bedtime.     fenofibrate 54 MG tablet 1 tablet with a meal Orally Once a day     ferrous sulfate 325 (65 FE) MG EC tablet Take 1 tablet (325 mg total) by mouth 2 (two) times a day. (Patient taking differently: Take 2 tablets by mouth 2 (two) times a day.) 60 tablet 5   folic acid (FOLVITE) 1 MG tablet TAKE 1 TABLET BY MOUTH DAILY (Patient taking differently: Take 1 mg by mouth in the morning and at  bedtime.) 90 tablet 0   HYDROcodone-acetaminophen (NORCO/VICODIN) 5-325 MG tablet Take 1 tablet by mouth every 6 (six) hours as needed for severe pain. 16 tablet 0   hydroxychloroquine (PLAQUENIL) 200 MG tablet TAKE 2 TABLETS BY MOUTH DAILY (Patient taking differently: Take 200 mg by mouth daily.) 60 tablet 5   hydrOXYzine (ATARAX) 25 MG tablet TAKE 1 TABLET BY MOUTH 3 TIMES DAILY AS NEEDED 270 tablet 3   ibuprofen (ADVIL) 200 MG tablet Take 400 mg by mouth daily as needed (pain).     leflunomide (ARAVA) 20 MG tablet Take 40 mg by mouth at bedtime.     nitroGLYCERIN (NITROSTAT) 0.4 MG SL tablet Place 1 tablet (0.4 mg total) under the tongue every 5 (five) minutes as needed for chest pain. 25 tablet 3   Omega-3 Fatty Acids (FISH OIL PO) Take 1 capsule by mouth daily.     OVER THE COUNTER MEDICATION Apply 1 Application topically as needed (pain). CBD oil     oxyCODONE-acetaminophen (PERCOCET/ROXICET) 5-325 MG tablet 1 tablet as needed Orally every 6 hrs     pravastatin (PRAVACHOL) 80 MG tablet 1 tablet Orally Once a day     sildenafil (REVATIO) 20 MG tablet Take 3 pills daily as needed 60 tablet 2   TADALAFIL PO Take 1 tablet by mouth as needed (sexual disfunction).     triamcinolone (NASACORT AQ) 55 MCG/ACT AERO nasal inhaler Place 2 sprays into the nose daily. 1 Inhaler 12   No current facility-administered medications on file prior to visit.        ROS:  All others reviewed and negative.  Objective        PE:  BP 136/66   Pulse 60   Temp 97.8 F (36.6 C) (Temporal)   Ht 5\' 10"  (1.778 m)   Wt 153 lb 8 oz (69.6 kg)   SpO2 96%   BMI 22.02 kg/m                 Constitutional: Pt appears in NAD               HENT: Head: NCAT.                Right Ear: External ear normal.                 Left Ear: External ear normal.                Eyes: . Pupils are equal, round, and reactive to light. Conjunctivae and EOM are normal  Nose: without d/c or deformity               Neck:  Neck supple. Gross normal ROM               Cardiovascular: Normal rate and regular rhythm.                 Pulmonary/Chest: Effort normal and breath sounds without rales or wheezing.                Abd:  Soft, NT, ND, + BS, no organomegaly               Neurological: Pt is alert. At baseline orientation, motor grossly intact               Skin: Skin is warm. LE edema - none, right temple area with 1 cm area red slight raised non tender               Psychiatric: Pt behavior is normal without agitation   Micro: none  Cardiac tracings I have personally interpreted today:  none  Pertinent Radiological findings (summarize): none   Lab Results  Component Value Date   WBC 5.9 06/07/2022   HGB 13.4 06/07/2022   HCT 40.9 06/07/2022   PLT 273.0 06/07/2022   GLUCOSE 210 (H) 06/07/2022   CHOL 166 06/07/2022   TRIG 74.0 06/07/2022   HDL 85.90 06/07/2022   LDLDIRECT 142.5 02/27/2014   LDLCALC 65 06/07/2022   ALT 21 06/07/2022   AST 24 06/07/2022   NA 136 06/07/2022   K 4.2 06/07/2022   CL 98 06/07/2022   CREATININE 0.70 06/07/2022   BUN 12 06/07/2022   CO2 28 06/07/2022   TSH 1.98 06/07/2022   PSA 0.64 06/07/2022   INR 1.0 09/15/2008   HGBA1C 7.2 (H) 06/07/2022   MICROALBUR <0.7 06/07/2022   Assessment/Plan:  ADDAI AFSHARI is a 77 y.o. White or Caucasian [1] male with  has a past medical history of Anemia, Anxiety, Arthritis, Bipolar depression (HCC), CAD, multiple vessel, Cancer (HCC), Chronic pain disorder, Depression, Diabetes mellitus without complication (HCC), Erectile dysfunction (01/17/2013), GERD (gastroesophageal reflux disease), History of colon polyps, Hyperlipidemia, Hypertension, Insomnia, Iron deficiency anemia (01/16/2013), Mixed hyperlipidemia (03/08/2013), Posterior neck pain (01/22/2019), Status post insertion of drug-eluting stent into left anterior descending (LAD) artery for coronary artery disease, Stented coronary artery, Systolic murmur, and Unspecified vitamin  D deficiency (01/17/2013).  Hypertension BP Readings from Last 3 Encounters:  07/07/22 136/66  06/16/22 118/62  06/07/22 118/62   Stable, pt to continue medical treatment norvasc 5 mg qd, altace 5 mg qd   Hyperlipidemia Lab Results  Component Value Date   LDLCALC 65 06/07/2022   Stable, pt to continue current statin pravachol 80 mg qd   Vitamin D deficiency Last vitamin D Lab Results  Component Value Date   VD25OH 49.20 06/07/2022   Stable, cont oral replacement   Facial skin lesion Likely skin ca - for derm referral urgent, as routine can take up to 6 mo for new pt referrals  Diabetes (HCC) Lab Results  Component Value Date   HGBA1C 7.2 (H) 06/07/2022   Uncontrolled, pt to increase current medical treatment metformin ER 500 mg- 2 qd  Followup: Return in about 5 months (around 11/29/2022).  Oliver Barre, MD 07/08/2022 12:45 PM [facility]

## 2022-07-08 ENCOUNTER — Encounter: Payer: Self-pay | Admitting: Internal Medicine

## 2022-07-08 NOTE — Assessment & Plan Note (Signed)
Lab Results  Component Value Date   HGBA1C 7.2 (H) 06/07/2022   Uncontrolled, pt to increase current medical treatment metformin ER 500 mg- 2 qd

## 2022-07-08 NOTE — Assessment & Plan Note (Signed)
Likely skin ca - for derm referral urgent, as routine can take up to 6 mo for new pt referrals

## 2022-07-08 NOTE — Assessment & Plan Note (Signed)
BP Readings from Last 3 Encounters:  07/07/22 136/66  06/16/22 118/62  06/07/22 118/62   Stable, pt to continue medical treatment norvasc 5 mg qd, altace 5 mg qd

## 2022-07-08 NOTE — Assessment & Plan Note (Signed)
Lab Results  Component Value Date   LDLCALC 65 06/07/2022   Stable, pt to continue current statin pravachol 80 mg qd

## 2022-07-08 NOTE — Assessment & Plan Note (Signed)
Last vitamin D Lab Results  Component Value Date   VD25OH 49.20 06/07/2022   Stable, cont oral replacement

## 2022-08-16 ENCOUNTER — Other Ambulatory Visit: Payer: Self-pay | Admitting: Internal Medicine

## 2022-08-25 DIAGNOSIS — C44319 Basal cell carcinoma of skin of other parts of face: Secondary | ICD-10-CM | POA: Diagnosis not present

## 2022-08-25 DIAGNOSIS — L918 Other hypertrophic disorders of the skin: Secondary | ICD-10-CM | POA: Diagnosis not present

## 2022-08-25 DIAGNOSIS — L820 Inflamed seborrheic keratosis: Secondary | ICD-10-CM | POA: Diagnosis not present

## 2022-09-24 DIAGNOSIS — S80861A Insect bite (nonvenomous), right lower leg, initial encounter: Secondary | ICD-10-CM | POA: Diagnosis not present

## 2022-09-24 DIAGNOSIS — L03115 Cellulitis of right lower limb: Secondary | ICD-10-CM | POA: Diagnosis not present

## 2022-09-27 ENCOUNTER — Ambulatory Visit (INDEPENDENT_AMBULATORY_CARE_PROVIDER_SITE_OTHER): Payer: Medicare Other | Admitting: Internal Medicine

## 2022-09-27 ENCOUNTER — Encounter: Payer: Self-pay | Admitting: Internal Medicine

## 2022-09-27 VITALS — BP 138/58 | HR 57 | Temp 98.3°F | Ht 70.0 in | Wt 151.6 lb

## 2022-09-27 DIAGNOSIS — I10 Essential (primary) hypertension: Secondary | ICD-10-CM

## 2022-09-27 DIAGNOSIS — L02415 Cutaneous abscess of right lower limb: Secondary | ICD-10-CM | POA: Diagnosis not present

## 2022-09-27 DIAGNOSIS — J309 Allergic rhinitis, unspecified: Secondary | ICD-10-CM

## 2022-09-27 DIAGNOSIS — Z7984 Long term (current) use of oral hypoglycemic drugs: Secondary | ICD-10-CM

## 2022-09-27 DIAGNOSIS — E1165 Type 2 diabetes mellitus with hyperglycemia: Secondary | ICD-10-CM

## 2022-09-27 DIAGNOSIS — E559 Vitamin D deficiency, unspecified: Secondary | ICD-10-CM

## 2022-09-27 MED ORDER — SULFAMETHOXAZOLE-TRIMETHOPRIM 800-160 MG PO TABS: 1.0000 | ORAL_TABLET | Freq: Two times a day (BID) | ORAL | 0 refills | Status: DC

## 2022-09-27 NOTE — Assessment & Plan Note (Signed)
With mild worsening despite doxy - for change to septra ds bid, and refer general surgury

## 2022-09-27 NOTE — Assessment & Plan Note (Addendum)
Lab Results  Component Value Date   HGBA1C 7.2 (H) 06/07/2022   Uncontrolled, goal A1c < 7, pt to continue current medical treatment farxiga 10 every day, metformin ER 500 mg - 2 every day, declines change in tx today

## 2022-09-27 NOTE — Patient Instructions (Signed)
.  ok to change the doxy antibiotic to septra DS  Please continue all other medications as before, and refills have been done if requested.  Please have the pharmacy call with any other refills you may need.  Please keep your appointments with your specialists as you may have planned  You will be contacted regarding the referral for: General Surgury for I&D

## 2022-09-27 NOTE — Progress Notes (Signed)
Patient ID: Phillip [name], male   DOB: 1945-12-14, 77 y.o.   MRN: [id number]        Chief Complaint: follow up right leg infection abscess, htn, dm, low vit d, allergies       HPI:  Phillip [name] is a 77 y.o. male here with recent onset right lateral mid leg infection after probable spider bite in the clothes last Thursday.  [facility]en at [facility] next day Friday, started doxycycline course but has been worsening in that the area involved has become fluctuant and coming to a head, with worsening red, tender, swelling, but no drainage or fever, chills.  Pt denies chest pain, increased sob or doe, wheezing, orthopnea, PND, increased LE swelling, palpitations, dizziness or syncope.   Pt denies polydipsia, polyuria, or new focal neuro s/s.         Wt Readings from Last 3 Encounters:  09/27/22 151 lb 9.6 oz (68.8 kg)  07/07/22 153 lb 8 oz (69.6 kg)  06/16/22 160 lb (72.6 kg)   BP Readings from Last 3 Encounters:  09/27/22 (!) 138/58  07/07/22 136/66  06/16/22 118/62         Past Medical History:  Diagnosis Date   Anemia    Anxiety    Arthritis    Bipolar depression (HCC)    CAD, multiple vessel    Cancer (HCC)    skin, basal   Chronic pain disorder    chronic bilat shoulder pain and feet pain   Depression    Diabetes mellitus without complication (HCC)    Erectile dysfunction 01/17/2013   GERD (gastroesophageal reflux disease)    History of colon polyps    Hyperlipidemia    Hypertension    Insomnia    Iron deficiency anemia 01/16/2013   Mixed hyperlipidemia 03/08/2013   Posterior neck pain 01/22/2019   Status post insertion of drug-eluting stent into left anterior descending (LAD) artery for coronary artery disease    Stented coronary artery    X 2   Systolic murmur    Unspecified vitamin D deficiency 01/17/2013   Past Surgical History:  Procedure Laterality Date   CAROTID STENT  2008   CIRCUMCISION N/A 09/30/2021   Procedure: CIRCUMCISION ADULT;  Surgeon: Despina Arias, MD;   Location: [facility];  Service: Urology;  Laterality: N/A;   CORONARY ANGIOPLASTY WITH STENT PLACEMENT  about 2014   current Cardiology: Dr Jomarie Longs   EYE SURGERY      reports that he quit smoking about 20 years ago. His smoking use included cigarettes. He started smoking about 40 years ago. He has a 20 pack-year smoking history. His smokeless tobacco use includes chew. He reports current alcohol use. He reports that he does not use drugs. family history includes Alzheimer's disease in his brother; Cancer in his maternal grandfather, maternal grandmother, and mother; Heart disease in his maternal grandmother, mother, and paternal grandfather; Hyperlipidemia in his maternal grandmother; Lung cancer in his father; Stroke in his maternal grandmother and paternal grandfather. Allergies  Allergen Reactions   Lipitor [Atorvastatin] Other (See Comments)    Myalgia, was unable to walk per Pt.    Current Outpatient Medications on File Prior to Visit  Medication Sig Dispense Refill   ALPRAZolam (XANAX) 1 MG tablet TAKE 1 TABLET BY MOUTH TWICE DAILY AS NEEDED 60 tablet 2   amLODipine (NORVASC) 5 MG tablet TAKE 1 TABLET BY MOUTH DAILY IN THE MORNING AND AT BEDTIME 180 tablet 3   aspirin 81 MG tablet Take  81 mg by mouth daily.     benzonatate (TESSALON) 200 MG capsule Take 1 capsule (200 mg total) by mouth 2 (two) times daily as needed for cough. 20 capsule 0   carbamazepine (TEGRETOL) 200 MG tablet Take 1 tablet (200 mg total) by mouth 2 (two) times daily. 180 tablet 1   carvedilol (COREG) 12.5 MG tablet Take 1 tablet (12.5 mg total) by mouth 2 (two) times daily with a meal. 90 tablet 3   clobetasol cream (TEMOVATE) 0.05 % APPLY TOPICALLY TWICE DAILY AS NEEDED 60 g 1   Clobetasol Prop Crea-Coal Tar (CLOBETA CREAM EX) Clobeta Cream     clopidogrel (PLAVIX) 75 MG tablet 1 tablet Orally Once a day     Cyanocobalamin (VITAMIN B 12 PO) Take 1 capsule by mouth in the morning and at bedtime.     dapagliflozin  propanediol (FARXIGA) 10 MG TABS tablet Take 1 tablet (10 mg total) by mouth daily before breakfast. 90 tablet 3   doxycycline (VIBRAMYCIN) 100 MG capsule Take 100 mg by mouth 2 (two) times daily.     fenofibrate 54 MG tablet 1 tablet with a meal Orally Once a day     ferrous sulfate 325 (65 FE) MG EC tablet Take 1 tablet (325 mg total) by mouth 2 (two) times a day. (Patient taking differently: Take 2 tablets by mouth 2 (two) times a day.) 60 tablet 5   folic acid (FOLVITE) 1 MG tablet TAKE 1 TABLET BY MOUTH DAILY (Patient taking differently: Take 1 mg by mouth in the morning and at bedtime.) 90 tablet 0   HYDROcodone-acetaminophen (NORCO/VICODIN) 5-325 MG tablet Take 1 tablet by mouth every 6 (six) hours as needed for severe pain. 16 tablet 0   hydroxychloroquine (PLAQUENIL) 200 MG tablet TAKE 2 TABLETS BY MOUTH DAILY (Patient taking differently: Take 200 mg by mouth daily.) 60 tablet 5   hydrOXYzine (ATARAX) 25 MG tablet TAKE 1 TABLET BY MOUTH 3 TIMES DAILY AS NEEDED 270 tablet 3   ibuprofen (ADVIL) 200 MG tablet Take 400 mg by mouth daily as needed (pain).     leflunomide (ARAVA) 20 MG tablet Take 40 mg by mouth at bedtime.     metFORMIN (GLUCOPHAGE-XR) 500 MG 24 hr tablet Take 2 tablets (1,000 mg total) by mouth daily with breakfast. 180 tablet 3   nitroGLYCERIN (NITROSTAT) 0.4 MG SL tablet Place 1 tablet (0.4 mg total) under the tongue every 5 (five) minutes as needed for chest pain. 25 tablet 3   Omega-3 Fatty Acids (FISH OIL PO) Take 1 capsule by mouth daily.     OVER THE COUNTER MEDICATION Apply 1 Application topically as needed (pain). CBD oil     oxyCODONE-acetaminophen (PERCOCET/ROXICET) 5-325 MG tablet 1 tablet as needed Orally every 6 hrs     pantoprazole (PROTONIX) 40 MG tablet Take 1 tablet (40 mg total) by mouth 2 (two) times daily. 180 tablet 3   pravastatin (PRAVACHOL) 80 MG tablet 1 tablet Orally Once a day     predniSONE (DELTASONE) 10 MG tablet Take 1 tablet (10 mg total) by  mouth daily. 90 tablet 1   ramipril (ALTACE) 5 MG capsule Take 1 capsule (5 mg total) by mouth daily. 90 capsule 3   rosuvastatin (CRESTOR) 20 MG tablet Take 1 tablet (20 mg total) by mouth daily. 90 tablet 3   sildenafil (REVATIO) 20 MG tablet Take 3 pills daily as needed 60 tablet 2   TADALAFIL PO Take 1 tablet by mouth as needed (  sexual disfunction).     tamsulosin (FLOMAX) 0.4 MG CAPS capsule Take 1 capsule (0.4 mg total) by mouth daily. 90 capsule 3   traZODone (DESYREL) 50 MG tablet TAKE 1 TABLET BY MOUTH EACH NIGHT AT BEDTIME 90 tablet 1   triamcinolone (NASACORT AQ) 55 MCG/ACT AERO nasal inhaler Place 2 sprays into the nose daily. 1 Inhaler 12   No current facility-administered medications on file prior to visit.        ROS:  All others reviewed and negative.  Objective        PE:  BP (!) 138/58 (BP Location: Left Arm, Patient Position: Sitting, Cuff Size: Normal)   Pulse (!) 57   Temp 98.3 F (36.8 C) (Oral)   Ht 5\' 10"  (1.778 m)   Wt 151 lb 9.6 oz (68.8 kg)   SpO2 96%   BMI 21.75 kg/m                 Constitutional: Pt appears in NAD               HENT: Head: NCAT.                Right Ear: External ear normal.                 Left Ear: External ear normal. Bilat tm's with mild erythema.  Max sinus areas non tender.  Pharynx with mild erythema, no exudate               Eyes: . Pupils are equal, round, and reactive to light. Conjunctivae and EOM are normal               Nose: without d/c or deformity               Neck: Neck supple. Gross normal ROM               Cardiovascular: Normal rate and regular rhythm.                 Pulmonary/Chest: Effort normal and breath sounds without rales or wheezing.                               Neurological: Pt is alert. At baseline orientation, motor grossly intact               Skin: Skin is warm. LE edema - none; right mid lateral leg with 2 cm area fluctuance raised red tender without drainage               Psychiatric: Pt behavior  is normal without agitation   Micro: none  Cardiac tracings I have personally interpreted today:  none  Pertinent Radiological findings (summarize): none   Lab Results  Component Value Date   WBC 5.9 06/07/2022   HGB 13.4 06/07/2022   HCT 40.9 06/07/2022   PLT 273.0 06/07/2022   GLUCOSE 210 (H) 06/07/2022   CHOL 166 06/07/2022   TRIG 74.0 06/07/2022   HDL 85.90 06/07/2022   LDLDIRECT 142.5 02/27/2014   LDLCALC 65 06/07/2022   ALT 21 06/07/2022   AST 24 06/07/2022   NA 136 06/07/2022   K 4.2 06/07/2022   CL 98 06/07/2022   CREATININE 0.70 06/07/2022   BUN 12 06/07/2022   CO2 28 06/07/2022   TSH 1.98 06/07/2022   PSA 0.64 06/07/2022   INR 1.0 09/15/2008   HGBA1C 7.2 (H) 06/07/2022  MICROALBUR <0.7 06/07/2022   Assessment/Plan:  Phillip [name] is a 77 y.o. White or Caucasian [1] male with  has a past medical history of Anemia, Anxiety, Arthritis, Bipolar depression (HCC), CAD, multiple vessel, Cancer (HCC), Chronic pain disorder, Depression, Diabetes mellitus without complication (HCC), Erectile dysfunction (01/17/2013), GERD (gastroesophageal reflux disease), History of colon polyps, Hyperlipidemia, Hypertension, Insomnia, Iron deficiency anemia (01/16/2013), Mixed hyperlipidemia (03/08/2013), Posterior neck pain (01/22/2019), Status post insertion of drug-eluting stent into left anterior descending (LAD) artery for coronary artery disease, Stented coronary artery, Systolic murmur, and Unspecified vitamin D deficiency (01/17/2013).  Abscess of right leg With mild worsening despite doxy - for change to septra ds bid, and refer general surgury  Diabetes (HCC) Lab Results  Component Value Date   HGBA1C 7.2 (H) 06/07/2022   Uncontrolled, goal A1c < 7, pt to continue current medical treatment farxiga 10 every day, metformin ER 500 mg - 2 every day, declines change in tx today   Hypertension BP Readings from Last 3 Encounters:  09/27/22 (!) 138/58  07/07/22 136/66   06/16/22 118/62   Stable, pt to continue medical treatment norvasc 5 every day, coreg 12.5 bid, altace 5 qd   Vitamin D deficiency Last vitamin D Lab Results  Component Value Date   VD25OH 49.20 06/07/2022   Stable, cont oral replacement   Allergic rhinitis Mild to mod, for add nasacort asd, to f/u any worsening symptoms or concerns Followup: Return if symptoms worsen or fail to improve.  Oliver Barre, MD 09/27/2022 4:31 PM [facility]

## 2022-09-27 NOTE — Assessment & Plan Note (Signed)
BP Readings from Last 3 Encounters:  09/27/22 (!) 138/58  07/07/22 136/66  06/16/22 118/62   Stable, pt to continue medical treatment norvasc 5 every day, coreg 12.5 bid, altace 5 qd

## 2022-09-27 NOTE — Assessment & Plan Note (Signed)
Last vitamin D Lab Results  Component Value Date   VD25OH 49.20 06/07/2022   Stable, cont oral replacement

## 2022-09-27 NOTE — Assessment & Plan Note (Signed)
Mild to mod, for add nasacort asd,  to f/u any worsening symptoms or concerns 

## 2022-11-11 ENCOUNTER — Telehealth: Payer: Self-pay | Admitting: Internal Medicine

## 2022-11-11 MED ORDER — EMPAGLIFLOZIN 25 MG PO TABS: 25.0000 mg | ORAL_TABLET | Freq: Every day | ORAL | 3 refills | Status: DC

## 2022-11-11 NOTE — Telephone Encounter (Signed)
Ok I can try to change to jardiance 25 mg per day as this is about the same, and hopefully more affordable - done erx

## 2022-11-11 NOTE — Telephone Encounter (Signed)
Patient called and said his dapagliflozin propanediol (FARXIGA) 10 MG TABS tablet increased by over $200. He would like to know if there is something it can be changed to or if there is anything that can be done to lower the price. Patient would like a call back at [phone].

## 2022-11-12 NOTE — Telephone Encounter (Signed)
Called and left voice mail

## 2022-11-19 ENCOUNTER — Ambulatory Visit (INDEPENDENT_AMBULATORY_CARE_PROVIDER_SITE_OTHER): Payer: Medicare Other | Admitting: Internal Medicine

## 2022-11-19 ENCOUNTER — Encounter: Payer: Self-pay | Admitting: Internal Medicine

## 2022-11-19 VITALS — BP 130/82 | HR 60 | Temp 98.2°F | Ht 70.0 in | Wt 156.0 lb

## 2022-11-19 DIAGNOSIS — I10 Essential (primary) hypertension: Secondary | ICD-10-CM

## 2022-11-19 DIAGNOSIS — J449 Chronic obstructive pulmonary disease, unspecified: Secondary | ICD-10-CM

## 2022-11-19 DIAGNOSIS — E1165 Type 2 diabetes mellitus with hyperglycemia: Secondary | ICD-10-CM | POA: Diagnosis not present

## 2022-11-19 DIAGNOSIS — R351 Nocturia: Secondary | ICD-10-CM

## 2022-11-19 DIAGNOSIS — E559 Vitamin D deficiency, unspecified: Secondary | ICD-10-CM

## 2022-11-19 DIAGNOSIS — Z23 Encounter for immunization: Secondary | ICD-10-CM

## 2022-11-19 DIAGNOSIS — E782 Mixed hyperlipidemia: Secondary | ICD-10-CM

## 2022-11-19 DIAGNOSIS — Z7984 Long term (current) use of oral hypoglycemic drugs: Secondary | ICD-10-CM | POA: Diagnosis not present

## 2022-11-19 DIAGNOSIS — N401 Enlarged prostate with lower urinary tract symptoms: Secondary | ICD-10-CM

## 2022-11-19 LAB — BASIC METABOLIC PANEL
BUN: 6 mg/dL (ref 6–23)
CO2: 29 meq/L (ref 19–32)
Calcium: 9.2 mg/dL (ref 8.4–10.5)
Chloride: 99 meq/L (ref 96–112)
Creatinine, Ser: 0.57 mg/dL (ref 0.40–1.50)
GFR: 94.52 mL/min (ref >60.00)
Glucose, Bld: 131 mg/dL — ABNORMAL HIGH (ref 70–99)
Potassium: 3.9 meq/L (ref 3.5–5.1)
Sodium: 137 meq/L (ref 135–145)

## 2022-11-19 LAB — HEPATIC FUNCTION PANEL
ALT: 20 U/L (ref 0–53)
AST: 16 U/L (ref 0–37)
Albumin: 3.9 g/dL (ref 3.5–5.2)
Alkaline Phosphatase: 51 U/L (ref 39–117)
Bilirubin, Direct: 0.1 mg/dL (ref 0.0–0.3)
Total Bilirubin: 0.2 mg/dL (ref 0.2–1.2)
Total Protein: 6.3 g/dL (ref 6.0–8.3)

## 2022-11-19 LAB — LIPID PANEL
Cholesterol: 163 mg/dL (ref 0–200)
HDL: 91.9 mg/dL (ref >39.00)
LDL Cholesterol: 57 mg/dL (ref 0–99)
NonHDL: 71.08
Total CHOL/HDL Ratio: 2
Triglycerides: 71 mg/dL (ref 0.0–149.0)
VLDL: 14.2 mg/dL (ref 0.0–40.0)

## 2022-11-19 LAB — HEMOGLOBIN A1C: Hgb A1c MFr Bld: 6.3 % (ref 4.6–6.5)

## 2022-11-19 MED ORDER — METFORMIN HCL ER 500 MG PO TB24: 1000.0000 mg | ORAL_TABLET | Freq: Every day | ORAL | 3 refills | Status: DC

## 2022-11-19 NOTE — Patient Instructions (Addendum)
/  You had the flu shot today  Ok for now due to the cost to change the farxiga to the metformin ER 500 mg - 2 per day; next year we can probably change back after the Donut hole goes away  Please call if you change your mind about the inhaler  Please continue all other medications as before, and refills have been done if requested.  Please have the pharmacy call with any other refills you may need.  Please continue your efforts at being more active, low cholesterol diet, and weight control.  Please keep your appointments with your specialists as you may have planned  Please go to the LAB at the blood drawing area for the tests to be done  You will be contacted by phone if any changes need to be made immediately.  Otherwise, you will receive a letter about your results with an explanation, but please check with MyChart first.  Please make an Appointment to return in 6 months, or sooner if needed, also with Lab Appointment for testing done 3-5 days before at the FIRST FLOOR Lab (so this is for TWO appointments - please see the scheduling desk as you leave)

## 2022-11-19 NOTE — Progress Notes (Signed)
The test results show that your current treatment is OK, as the tests are stable.  Please continue the same plan.  There is no other need for change of treatment or further evaluation based on these results, at this time.  thanks 

## 2022-11-19 NOTE — Progress Notes (Unsigned)
Patient ID: Phillip [name], male   DOB: 20-Mar-1945, 77 y.o.   MRN: [id number]        Chief Complaint: follow up HTN, HLD and hyperglycemia ***       HPI:  Phillip [name] is a 77 y.o. male here with c/o        Wt Readings from Last 3 Encounters:  11/19/22 156 lb (70.8 kg)  09/27/22 151 lb 9.6 oz (68.8 kg)  07/07/22 153 lb 8 oz (69.6 kg)   BP Readings from Last 3 Encounters:  11/19/22 130/82  09/27/22 (!) 138/58  07/07/22 136/66         Past Medical History:  Diagnosis Date   Anemia    Anxiety    Arthritis    Bipolar depression (HCC)    CAD, multiple vessel    Cancer (HCC)    skin, basal   Chronic pain disorder    chronic bilat shoulder pain and feet pain   Depression    Diabetes mellitus without complication (HCC)    Erectile dysfunction 01/17/2013   GERD (gastroesophageal reflux disease)    History of colon polyps    Hyperlipidemia    Hypertension    Insomnia    Iron deficiency anemia 01/16/2013   Mixed hyperlipidemia 03/08/2013   Posterior neck pain 01/22/2019   Status post insertion of drug-eluting stent into left anterior descending (LAD) artery for coronary artery disease    Stented coronary artery    X 2   Systolic murmur    Unspecified vitamin D deficiency 01/17/2013   Past Surgical History:  Procedure Laterality Date   CAROTID STENT  2008   CIRCUMCISION N/A 09/30/2021   Procedure: CIRCUMCISION ADULT;  Surgeon: Despina Arias, MD;  Location: [facility];  Service: Urology;  Laterality: N/A;   CORONARY ANGIOPLASTY WITH STENT PLACEMENT  about 2014   current Cardiology: Dr Jomarie Longs   EYE SURGERY      reports that he quit smoking about 20 years ago. His smoking use included cigarettes. He started smoking about 40 years ago. He has a 20 pack-year smoking history. His smokeless tobacco use includes chew. He reports current alcohol use. He reports that he does not use drugs. family history includes Alzheimer's disease in his brother; Cancer in his maternal  grandfather, maternal grandmother, and mother; Heart disease in his maternal grandmother, mother, and paternal grandfather; Hyperlipidemia in his maternal grandmother; Lung cancer in his father; Stroke in his maternal grandmother and paternal grandfather. Allergies  Allergen Reactions   Lipitor [Atorvastatin] Other (See Comments)    Myalgia, was unable to walk per Pt.    Current Outpatient Medications on File Prior to Visit  Medication Sig Dispense Refill   ALPRAZolam (XANAX) 1 MG tablet TAKE 1 TABLET BY MOUTH TWICE DAILY AS NEEDED 60 tablet 2   amLODipine (NORVASC) 5 MG tablet TAKE 1 TABLET BY MOUTH DAILY IN THE MORNING AND AT BEDTIME 180 tablet 3   aspirin 81 MG tablet Take 81 mg by mouth daily.     benzonatate (TESSALON) 200 MG capsule Take 1 capsule (200 mg total) by mouth 2 (two) times daily as needed for cough. 20 capsule 0   carbamazepine (TEGRETOL) 200 MG tablet Take 1 tablet (200 mg total) by mouth 2 (two) times daily. 180 tablet 1   carvedilol (COREG) 12.5 MG tablet Take 1 tablet (12.5 mg total) by mouth 2 (two) times daily with a meal. 90 tablet 3   clobetasol cream (TEMOVATE) 0.05 % APPLY TOPICALLY  TWICE DAILY AS NEEDED 60 g 1   Clobetasol Prop Crea-Coal Tar (CLOBETA CREAM EX) Clobeta Cream     clopidogrel (PLAVIX) 75 MG tablet 1 tablet Orally Once a day     Cyanocobalamin (VITAMIN B 12 PO) Take 1 capsule by mouth in the morning and at bedtime.     doxycycline (VIBRAMYCIN) 100 MG capsule Take 100 mg by mouth 2 (two) times daily.     empagliflozin (JARDIANCE) 25 MG TABS tablet Take 1 tablet (25 mg total) by mouth daily before breakfast. 90 tablet 3   fenofibrate 54 MG tablet 1 tablet with a meal Orally Once a day     ferrous sulfate 325 (65 FE) MG EC tablet Take 1 tablet (325 mg total) by mouth 2 (two) times a day. (Patient taking differently: Take 2 tablets by mouth 2 (two) times a day.) 60 tablet 5   folic acid (FOLVITE) 1 MG tablet TAKE 1 TABLET BY MOUTH DAILY (Patient taking  differently: Take 1 mg by mouth in the morning and at bedtime.) 90 tablet 0   HYDROcodone-acetaminophen (NORCO/VICODIN) 5-325 MG tablet Take 1 tablet by mouth every 6 (six) hours as needed for severe pain. 16 tablet 0   hydroxychloroquine (PLAQUENIL) 200 MG tablet TAKE 2 TABLETS BY MOUTH DAILY (Patient taking differently: Take 200 mg by mouth daily.) 60 tablet 5   hydrOXYzine (ATARAX) 25 MG tablet TAKE 1 TABLET BY MOUTH 3 TIMES DAILY AS NEEDED 270 tablet 3   ibuprofen (ADVIL) 200 MG tablet Take 400 mg by mouth daily as needed (pain).     leflunomide (ARAVA) 20 MG tablet Take 40 mg by mouth at bedtime.     metFORMIN (GLUCOPHAGE-XR) 500 MG 24 hr tablet Take 2 tablets (1,000 mg total) by mouth daily with breakfast. 180 tablet 3   nitroGLYCERIN (NITROSTAT) 0.4 MG SL tablet Place 1 tablet (0.4 mg total) under the tongue every 5 (five) minutes as needed for chest pain. 25 tablet 3   Omega-3 Fatty Acids (FISH OIL PO) Take 1 capsule by mouth daily.     OVER THE COUNTER MEDICATION Apply 1 Application topically as needed (pain). CBD oil     oxyCODONE-acetaminophen (PERCOCET/ROXICET) 5-325 MG tablet 1 tablet as needed Orally every 6 hrs     pantoprazole (PROTONIX) 40 MG tablet Take 1 tablet (40 mg total) by mouth 2 (two) times daily. 180 tablet 3   pravastatin (PRAVACHOL) 80 MG tablet 1 tablet Orally Once a day     predniSONE (DELTASONE) 10 MG tablet Take 1 tablet (10 mg total) by mouth daily. 90 tablet 1   ramipril (ALTACE) 5 MG capsule Take 1 capsule (5 mg total) by mouth daily. 90 capsule 3   rosuvastatin (CRESTOR) 20 MG tablet Take 1 tablet (20 mg total) by mouth daily. 90 tablet 3   sildenafil (REVATIO) 20 MG tablet Take 3 pills daily as needed 60 tablet 2   sulfamethoxazole-trimethoprim (BACTRIM DS) 800-160 MG tablet Take 1 tablet by mouth 2 (two) times daily. 20 tablet 0   TADALAFIL PO Take 1 tablet by mouth as needed (sexual disfunction).     traZODone (DESYREL) 50 MG tablet TAKE 1 TABLET BY MOUTH  EACH NIGHT AT BEDTIME 90 tablet 1   triamcinolone (NASACORT AQ) 55 MCG/ACT AERO nasal inhaler Place 2 sprays into the nose daily. 1 Inhaler 12   tamsulosin (FLOMAX) 0.4 MG CAPS capsule Take 1 capsule (0.4 mg total) by mouth daily. (Patient not taking: Reported on 11/19/2022) 90 capsule 3  No current facility-administered medications on file prior to visit.        ROS:  All others reviewed and negative.  Objective        PE:  BP 130/82 (BP Location: Right Arm, Patient Position: Sitting, Cuff Size: Normal)   Pulse 60   Temp 98.2 F (36.8 C) (Oral)   Ht 5\' 10"  (1.778 m)   Wt 156 lb (70.8 kg)   SpO2 98%   BMI 22.38 kg/m                 Constitutional: Pt appears in NAD               HENT: Head: NCAT.                Right Ear: External ear normal.                 Left Ear: External ear normal.                Eyes: . Pupils are equal, round, and reactive to light. Conjunctivae and EOM are normal               Nose: without d/c or deformity               Neck: Neck supple. Gross normal ROM               Cardiovascular: Normal rate and regular rhythm.                 Pulmonary/Chest: Effort normal and breath sounds without rales or wheezing.                Abd:  Soft, NT, ND, + BS, no organomegaly               Neurological: Pt is alert. At baseline orientation, motor grossly intact               Skin: Skin is warm. No rashes, no other new lesions, LE edema - ***               Psychiatric: Pt behavior is normal without agitation   Micro: none  Cardiac tracings I have personally interpreted today:  none  Pertinent Radiological findings (summarize): none   Lab Results  Component Value Date   WBC 5.9 06/07/2022   HGB 13.4 06/07/2022   HCT 40.9 06/07/2022   PLT 273.0 06/07/2022   GLUCOSE 210 (H) 06/07/2022   CHOL 166 06/07/2022   TRIG 74.0 06/07/2022   HDL 85.90 06/07/2022   LDLDIRECT 142.5 02/27/2014   LDLCALC 65 06/07/2022   ALT 21 06/07/2022   AST 24 06/07/2022   NA 136  06/07/2022   K 4.2 06/07/2022   CL 98 06/07/2022   CREATININE 0.70 06/07/2022   BUN 12 06/07/2022   CO2 28 06/07/2022   TSH 1.98 06/07/2022   PSA 0.64 06/07/2022   INR 1.0 09/15/2008   HGBA1C 7.2 (H) 06/07/2022   MICROALBUR <0.7 06/07/2022   Assessment/Plan:  EMBRY HUSS is a 77 y.o. White or Caucasian [1] male with  has a past medical history of Anemia, Anxiety, Arthritis, Bipolar depression (HCC), CAD, multiple vessel, Cancer (HCC), Chronic pain disorder, Depression, Diabetes mellitus without complication (HCC), Erectile dysfunction (01/17/2013), GERD (gastroesophageal reflux disease), History of colon polyps, Hyperlipidemia, Hypertension, Insomnia, Iron deficiency anemia (01/16/2013), Mixed hyperlipidemia (03/08/2013), Posterior neck pain (01/22/2019), Status post insertion of drug-eluting stent into left anterior descending (LAD) artery for  coronary artery disease, Stented coronary artery, Systolic murmur, and Unspecified vitamin D deficiency (01/17/2013).  No problem-specific Assessment & Plan notes found for this encounter.  Followup: No follow-ups on file.  Oliver Barre, MD 11/19/2022 8:49 AM [facility] - [facility]

## 2022-11-21 ENCOUNTER — Encounter: Payer: Self-pay | Admitting: Internal Medicine

## 2022-11-21 DIAGNOSIS — J449 Chronic obstructive pulmonary disease, unspecified: Secondary | ICD-10-CM | POA: Insufficient documentation

## 2022-11-21 NOTE — Assessment & Plan Note (Signed)
Out of flomax for 1 mo with recurrent symptoms, for restart flomax

## 2022-11-21 NOTE — Assessment & Plan Note (Signed)
Last vitamin D Lab Results  Component Value Date   VD25OH 49.20 06/07/2022   Stable, cont oral replacement

## 2022-11-21 NOTE — Assessment & Plan Note (Signed)
Lab Results  Component Value Date   HGBA1C 6.3 11/19/2022   Stable, but pt unable to afford farxiga for now  pt to change for now to metformin ER 500 mg - 2 qam, and consider return to farxiga 10 mg after jan 1 when new year starts with no further donut hole

## 2022-11-21 NOTE — Assessment & Plan Note (Signed)
BP Readings from Last 3 Encounters:  11/19/22 130/82  09/27/22 (!) 138/58  07/07/22 136/66   Stable, pt to continue medical treatment norvasc 5 every day, coreg 12.5 bid, altace 5 qd

## 2022-11-21 NOTE — Assessment & Plan Note (Signed)
Mild, decliens inhaler for now

## 2022-11-21 NOTE — Assessment & Plan Note (Signed)
Lab Results  Component Value Date   LDLCALC 57 11/19/2022   Stable, pt to continue current statin crestor 20 qd

## 2022-11-24 ENCOUNTER — Other Ambulatory Visit: Payer: Self-pay | Admitting: Internal Medicine

## 2022-11-29 ENCOUNTER — Ambulatory Visit: Payer: Medicare Other | Admitting: Internal Medicine

## 2022-12-30 ENCOUNTER — Ambulatory Visit (INDEPENDENT_AMBULATORY_CARE_PROVIDER_SITE_OTHER): Payer: Medicare Other | Admitting: Internal Medicine

## 2022-12-30 ENCOUNTER — Encounter: Payer: Self-pay | Admitting: Internal Medicine

## 2022-12-30 VITALS — BP 122/60 | HR 65 | Temp 98.7°F | Ht 70.0 in | Wt 153.0 lb

## 2022-12-30 DIAGNOSIS — E1165 Type 2 diabetes mellitus with hyperglycemia: Secondary | ICD-10-CM

## 2022-12-30 DIAGNOSIS — J329 Chronic sinusitis, unspecified: Secondary | ICD-10-CM

## 2022-12-30 DIAGNOSIS — R051 Acute cough: Secondary | ICD-10-CM

## 2022-12-30 DIAGNOSIS — R062 Wheezing: Secondary | ICD-10-CM | POA: Diagnosis not present

## 2022-12-30 DIAGNOSIS — M069 Rheumatoid arthritis, unspecified: Secondary | ICD-10-CM | POA: Diagnosis not present

## 2022-12-30 DIAGNOSIS — E559 Vitamin D deficiency, unspecified: Secondary | ICD-10-CM

## 2022-12-30 DIAGNOSIS — I10 Essential (primary) hypertension: Secondary | ICD-10-CM

## 2022-12-30 DIAGNOSIS — Z7984 Long term (current) use of oral hypoglycemic drugs: Secondary | ICD-10-CM

## 2022-12-30 LAB — POC COVID19 BINAXNOW: SARS Coronavirus 2 Ag: NEGATIVE

## 2022-12-30 MED ORDER — PREDNISONE 10 MG PO TABS: ORAL_TABLET | ORAL | 0 refills | Status: DC

## 2022-12-30 MED ORDER — METHYLPREDNISOLONE ACETATE 80 MG/ML IJ SUSP
80.0000 mg | Freq: Once | INTRAMUSCULAR | Status: AC
Start: 2022-12-30 — End: 2022-12-30
  Administered 2022-12-30: 80 mg via INTRAMUSCULAR

## 2022-12-30 MED ORDER — PROMETHAZINE-DM 6.25-15 MG/5ML PO SYRP: 5.0000 mL | ORAL_SOLUTION | Freq: Four times a day (QID) | ORAL | 1 refills | Status: DC | PRN

## 2022-12-30 MED ORDER — ALBUTEROL SULFATE HFA 108 (90 BASE) MCG/ACT IN AERS: 2.0000 | INHALATION_SPRAY | Freq: Four times a day (QID) | RESPIRATORY_TRACT | 5 refills | Status: DC | PRN

## 2022-12-30 MED ORDER — DOXYCYCLINE HYCLATE 100 MG PO CAPS: 100.0000 mg | ORAL_CAPSULE | Freq: Two times a day (BID) | ORAL | 0 refills | Status: DC

## 2022-12-30 NOTE — Assessment & Plan Note (Signed)
Stable, pt to restart low prednisone daily per rheum after prednisone taper

## 2022-12-30 NOTE — Patient Instructions (Addendum)
You had the steroid shot today  Please take all new medication as prescribed  - the antibiotic, cough medicine, prednisone, and inhaler as needed  Please continue all other medications as before, including restarting the prednisone 10 mg after todays prednisone is done  Please have the pharmacy call with any other refills you may need.  Please continue your efforts at being more active, low cholesterol diet, and weight control.  Please keep your appointments with your specialists as you may have planned  Please make an Appointment to return in 3 months, or sooner if needed

## 2022-12-30 NOTE — Progress Notes (Signed)
Patient ID: Phillip [name], male   DOB: Feb 19, 1946, 77 y.o.   MRN: [id number]        Chief Complaint: follow up sinusitis, wheezing, RA, htn, dm, low vit d       HPI:  Phillip [name] is a 77 y.o. male  Here with 2-3 days acute onset fever, facial pain, pressure, headache, general weakness and malaise, and greenish d/c, with mild ST and cough, but pt denies chest pain, wheezing, increased sob or doe, orthopnea, PND, increased LE swelling, palpitations, dizziness or syncope,, except for wheezing sob started last pm.   Pt denies polydipsia, polyuria, or new focal neuro s/s.    Pt denies fever, wt loss, night sweats, loss of appetite, or other constitutional symptoms  Takes low dose daily prednisone 10 mg for RA.   Wt Readings from Last 3 Encounters:  12/30/22 153 lb (69.4 kg)  11/19/22 156 lb (70.8 kg)  09/27/22 151 lb 9.6 oz (68.8 kg)   BP Readings from Last 3 Encounters:  12/30/22 122/60  11/19/22 130/82  09/27/22 (!) 138/58         Past Medical History:  Diagnosis Date   Anemia    Anxiety    Arthritis    Bipolar depression (HCC)    CAD, multiple vessel    Cancer (HCC)    skin, basal   Chronic pain disorder    chronic bilat shoulder pain and feet pain   Depression    Diabetes mellitus without complication (HCC)    Erectile dysfunction 01/17/2013   GERD (gastroesophageal reflux disease)    History of colon polyps    Hyperlipidemia    Hypertension    Insomnia    Iron deficiency anemia 01/16/2013   Mixed hyperlipidemia 03/08/2013   Posterior neck pain 01/22/2019   Status post insertion of drug-eluting stent into left anterior descending (LAD) artery for coronary artery disease    Stented coronary artery    X 2   Systolic murmur    Unspecified vitamin D deficiency 01/17/2013   Past Surgical History:  Procedure Laterality Date   CAROTID STENT  2008   CIRCUMCISION N/A 09/30/2021   Procedure: CIRCUMCISION ADULT;  Surgeon: Despina Arias, MD;  Location: [facility];  Service:  Urology;  Laterality: N/A;   CORONARY ANGIOPLASTY WITH STENT PLACEMENT  about 2014   current Cardiology: Dr Jomarie Longs   EYE SURGERY      reports that he quit smoking about 20 years ago. His smoking use included cigarettes. He started smoking about 40 years ago. He has a 20 pack-year smoking history. His smokeless tobacco use includes chew. He reports current alcohol use. He reports that he does not use drugs. family history includes Alzheimer's disease in his brother; Cancer in his maternal grandfather, maternal grandmother, and mother; Heart disease in his maternal grandmother, mother, and paternal grandfather; Hyperlipidemia in his maternal grandmother; Lung cancer in his father; Stroke in his maternal grandmother and paternal grandfather. Allergies  Allergen Reactions   Lipitor [Atorvastatin] Other (See Comments)    Myalgia, was unable to walk per Pt.    Current Outpatient Medications on File Prior to Visit  Medication Sig Dispense Refill   ALPRAZolam (XANAX) 1 MG tablet TAKE 1 TABLET BY MOUTH TWICE DAILY AS NEEDED 60 tablet 2   amLODipine (NORVASC) 5 MG tablet TAKE 1 TABLET BY MOUTH DAILY IN THE MORNING AND AT BEDTIME 180 tablet 3   aspirin 81 MG tablet Take 81 mg by mouth daily.  benzonatate (TESSALON) 200 MG capsule Take 1 capsule (200 mg total) by mouth 2 (two) times daily as needed for cough. 20 capsule 0   carbamazepine (TEGRETOL) 200 MG tablet Take 1 tablet (200 mg total) by mouth 2 (two) times daily. 180 tablet 1   carvedilol (COREG) 12.5 MG tablet Take 1 tablet (12.5 mg total) by mouth 2 (two) times daily with a meal. 90 tablet 3   clobetasol cream (TEMOVATE) 0.05 % APPLY TOPICALLY TWICE DAILY AS NEEDED 60 g 1   Clobetasol Prop Crea-Coal Tar (CLOBETA CREAM EX) Clobeta Cream     clopidogrel (PLAVIX) 75 MG tablet 1 tablet Orally Once a day     Cyanocobalamin (VITAMIN B 12 PO) Take 1 capsule by mouth in the morning and at bedtime.     FARXIGA 10 MG TABS tablet Take 10 mg by mouth  every morning.     fenofibrate 54 MG tablet 1 tablet with a meal Orally Once a day     ferrous sulfate 325 (65 FE) MG EC tablet Take 1 tablet (325 mg total) by mouth 2 (two) times a day. (Patient taking differently: Take 2 tablets by mouth 2 (two) times a day.) 60 tablet 5   folic acid (FOLVITE) 1 MG tablet TAKE 1 TABLET BY MOUTH DAILY (Patient taking differently: Take 1 mg by mouth in the morning and at bedtime.) 90 tablet 0   hydroxychloroquine (PLAQUENIL) 200 MG tablet TAKE 2 TABLETS BY MOUTH DAILY (Patient taking differently: Take 200 mg by mouth daily.) 60 tablet 5   hydrOXYzine (ATARAX) 25 MG tablet TAKE 1 TABLET BY MOUTH 3 TIMES DAILY AS NEEDED 270 tablet 3   ibuprofen (ADVIL) 200 MG tablet Take 400 mg by mouth daily as needed (pain).     leflunomide (ARAVA) 20 MG tablet Take 40 mg by mouth at bedtime.     metFORMIN (GLUCOPHAGE-XR) 500 MG 24 hr tablet Take 2 tablets (1,000 mg total) by mouth daily with breakfast. 180 tablet 3   nitroGLYCERIN (NITROSTAT) 0.4 MG SL tablet Place 1 tablet (0.4 mg total) under the tongue every 5 (five) minutes as needed for chest pain. 25 tablet 3   Omega-3 Fatty Acids (FISH OIL PO) Take 1 capsule by mouth daily.     OVER THE COUNTER MEDICATION Apply 1 Application topically as needed (pain). CBD oil     oxyCODONE-acetaminophen (PERCOCET/ROXICET) 5-325 MG tablet 1 tablet as needed Orally every 6 hrs     pantoprazole (PROTONIX) 40 MG tablet Take 1 tablet (40 mg total) by mouth 2 (two) times daily. 180 tablet 3   pravastatin (PRAVACHOL) 80 MG tablet 1 tablet Orally Once a day     predniSONE (DELTASONE) 10 MG tablet Take 1 tablet (10 mg total) by mouth daily. 90 tablet 1   ramipril (ALTACE) 5 MG capsule Take 1 capsule (5 mg total) by mouth daily. 90 capsule 3   rosuvastatin (CRESTOR) 20 MG tablet Take 1 tablet (20 mg total) by mouth daily. 90 tablet 3   sildenafil (REVATIO) 20 MG tablet Take 3 pills daily as needed 60 tablet 2   tamsulosin (FLOMAX) 0.4 MG CAPS  capsule Take 1 capsule (0.4 mg total) by mouth daily. 90 capsule 3   traZODone (DESYREL) 50 MG tablet TAKE 1 TABLET BY MOUTH EACH NIGHT AT BEDTIME 90 tablet 1   triamcinolone (NASACORT AQ) 55 MCG/ACT AERO nasal inhaler Place 2 sprays into the nose daily. 1 Inhaler 12   No current facility-administered medications on file prior to  visit.        ROS:  All others reviewed and negative.  Objective        PE:  BP 122/60 (BP Location: Right Arm, Patient Position: Sitting, Cuff Size: Normal)   Pulse 65   Temp 98.7 F (37.1 C) (Oral)   Ht 5\' 10"  (1.778 m)   Wt 153 lb (69.4 kg)   SpO2 99%   BMI 21.95 kg/m                 Constitutional: Pt appears in NAD               HENT: Head: NCAT.                Right Ear: External ear normal.                 Left Ear: External ear normal. Bilat tm's with mild erythema.  Max sinus areas mild tender.  Pharynx with mild erythema, no exudate                Eyes: . Pupils are equal, round, and reactive to light. Conjunctivae and EOM are normal               Nose: without d/c or deformity               Neck: Neck supple. Gross normal ROM               Cardiovascular: Normal rate and regular rhythm.                 Pulmonary/Chest: Effort normal and breath sounds without rales but with few bilat wheezing.                Abd:  Soft, NT, ND, + BS, no organomegaly               Neurological: Pt is alert. At baseline orientation, motor grossly intact; no synovitis               Skin: Skin is warm. No rashes, no other new lesions, LE edema - none               Psychiatric: Pt behavior is normal without agitation   Micro: none  Cardiac tracings I have personally interpreted today:  none  Pertinent Radiological findings (summarize): none   Lab Results  Component Value Date   WBC 5.9 06/07/2022   HGB 13.4 06/07/2022   HCT 40.9 06/07/2022   PLT 273.0 06/07/2022   GLUCOSE 131 (H) 11/19/2022   CHOL 163 11/19/2022   TRIG 71.0 11/19/2022   HDL 91.90  11/19/2022   LDLDIRECT 142.5 02/27/2014   LDLCALC 57 11/19/2022   ALT 20 11/19/2022   AST 16 11/19/2022   NA 137 11/19/2022   K 3.9 11/19/2022   CL 99 11/19/2022   CREATININE 0.57 11/19/2022   BUN 6 11/19/2022   CO2 29 11/19/2022   TSH 1.98 06/07/2022   PSA 0.64 06/07/2022   INR 1.0 09/15/2008   HGBA1C 6.3 11/19/2022   MICROALBUR <0.7 06/07/2022   POCT  - COVID - neg  Assessment/Plan:  Phillip [name] is a 77 y.o. White or Caucasian [1] male with  has a past medical history of Anemia, Anxiety, Arthritis, Bipolar depression (HCC), CAD, multiple vessel, Cancer (HCC), Chronic pain disorder, Depression, Diabetes mellitus without complication (HCC), Erectile dysfunction (01/17/2013), GERD (gastroesophageal reflux disease), History of colon polyps, Hyperlipidemia, Hypertension, Insomnia, Iron deficiency anemia (  01/16/2013), Mixed hyperlipidemia (03/08/2013), Posterior neck pain (01/22/2019), Status post insertion of drug-eluting stent into left anterior descending (LAD) artery for coronary artery disease, Stented coronary artery, Systolic murmur, and Unspecified vitamin D deficiency (01/17/2013).  Rheumatoid arthritis (HCC) Stable, pt to restart low prednisone daily per rheum after prednisone taper  Wheezing Mild to mod, for prednisone taper, for depomedrol 80 mgIM,  to f/u any worsening symptoms or concerns  Vitamin D deficiency Last vitamin D Lab Results  Component Value Date   VD25OH 49.20 06/07/2022   Stable, cont oral replacement   Hypertension BP Readings from Last 3 Encounters:  12/30/22 122/60  11/19/22 130/82  09/27/22 (!) 138/58   Stable, pt to continue medical treatment norvasc 5 every day, coret 12.5 bid   Diabetes (HCC) Lab Results  Component Value Date   HGBA1C 6.3 11/19/2022   Stable, pt to continue current medical treatment farxiga 10 every day , metformin ER 500 - 2 qd   Sinusitis Mild to mod, for antibx course doxycycline,  to f/u any worsening  symptoms or concerns  Followup: Return in about 3 months (around 04/01/2023).  Oliver Barre, MD 01/01/2023 12:54 PM [facility]

## 2023-01-01 ENCOUNTER — Encounter: Payer: Self-pay | Admitting: Internal Medicine

## 2023-01-01 NOTE — Assessment & Plan Note (Signed)
Last vitamin D Lab Results  Component Value Date   VD25OH 49.20 06/07/2022   Stable, cont oral replacement

## 2023-01-01 NOTE — Assessment & Plan Note (Addendum)
Mild to mod, for prednisone taper, for depomedrol 80 mgIM,  to f/u any worsening symptoms or concerns

## 2023-01-01 NOTE — Assessment & Plan Note (Signed)
Lab Results  Component Value Date   HGBA1C 6.3 11/19/2022   Stable, pt to continue current medical treatment farxiga 10 every day , metformin ER 500 - 2 qd

## 2023-01-01 NOTE — Assessment & Plan Note (Signed)
Mild to mod, for antibx course - doxycycline,  to f/u any worsening symptoms or concerns 

## 2023-01-01 NOTE — Assessment & Plan Note (Signed)
BP Readings from Last 3 Encounters:  12/30/22 122/60  11/19/22 130/82  09/27/22 (!) 138/58   Stable, pt to continue medical treatment norvasc 5 every day, coret 12.5 bid

## 2023-01-06 ENCOUNTER — Telehealth: Payer: Self-pay | Admitting: Internal Medicine

## 2023-01-06 NOTE — Telephone Encounter (Signed)
Sorry I dont see a specific question or request,   please clarify

## 2023-01-06 NOTE — Telephone Encounter (Signed)
Patient is having issues with his blood pressure since his visit - please call patient and advise.  [phone]

## 2023-01-13 NOTE — Telephone Encounter (Signed)
Called and left voice mail

## 2023-01-25 DIAGNOSIS — M2550 Pain in unspecified joint: Secondary | ICD-10-CM | POA: Diagnosis not present

## 2023-01-25 DIAGNOSIS — Z79899 Other long term (current) drug therapy: Secondary | ICD-10-CM | POA: Diagnosis not present

## 2023-01-25 DIAGNOSIS — M0579 Rheumatoid arthritis with rheumatoid factor of multiple sites without organ or systems involvement: Secondary | ICD-10-CM | POA: Diagnosis not present

## 2023-01-25 DIAGNOSIS — I2510 Atherosclerotic heart disease of native coronary artery without angina pectoris: Secondary | ICD-10-CM | POA: Diagnosis not present

## 2023-02-24 ENCOUNTER — Other Ambulatory Visit: Payer: Self-pay | Admitting: Internal Medicine

## 2023-04-05 DIAGNOSIS — E119 Type 2 diabetes mellitus without complications: Secondary | ICD-10-CM | POA: Diagnosis not present

## 2023-04-05 DIAGNOSIS — H524 Presbyopia: Secondary | ICD-10-CM | POA: Diagnosis not present

## 2023-04-05 LAB — HM DIABETES EYE EXAM

## 2023-05-23 ENCOUNTER — Other Ambulatory Visit: Payer: Self-pay | Admitting: Internal Medicine

## 2023-06-01 ENCOUNTER — Other Ambulatory Visit: Payer: Self-pay | Admitting: Internal Medicine

## 2023-06-01 ENCOUNTER — Other Ambulatory Visit: Payer: Self-pay

## 2023-06-02 ENCOUNTER — Other Ambulatory Visit: Payer: Self-pay | Admitting: Cardiovascular Disease

## 2023-06-23 ENCOUNTER — Other Ambulatory Visit: Payer: Self-pay | Admitting: Internal Medicine

## 2023-07-11 ENCOUNTER — Other Ambulatory Visit: Payer: Self-pay | Admitting: Cardiovascular Disease

## 2023-07-20 ENCOUNTER — Other Ambulatory Visit: Payer: Self-pay

## 2023-07-20 ENCOUNTER — Other Ambulatory Visit: Payer: Self-pay | Admitting: Internal Medicine

## 2023-08-08 ENCOUNTER — Other Ambulatory Visit: Payer: Self-pay | Admitting: Cardiovascular Disease

## 2023-08-22 ENCOUNTER — Other Ambulatory Visit: Payer: Self-pay | Admitting: Cardiovascular Disease

## 2023-08-22 ENCOUNTER — Other Ambulatory Visit: Payer: Self-pay | Admitting: Internal Medicine

## 2023-08-25 ENCOUNTER — Encounter: Payer: Self-pay | Admitting: Cardiovascular Disease

## 2023-08-25 NOTE — Telephone Encounter (Signed)
 Error

## 2023-08-31 ENCOUNTER — Other Ambulatory Visit: Payer: Self-pay | Admitting: Cardiovascular Disease

## 2023-08-31 DIAGNOSIS — M2550 Pain in unspecified joint: Secondary | ICD-10-CM | POA: Diagnosis not present

## 2023-08-31 DIAGNOSIS — I2510 Atherosclerotic heart disease of native coronary artery without angina pectoris: Secondary | ICD-10-CM | POA: Diagnosis not present

## 2023-08-31 DIAGNOSIS — Z79899 Other long term (current) drug therapy: Secondary | ICD-10-CM | POA: Diagnosis not present

## 2023-08-31 DIAGNOSIS — M0579 Rheumatoid arthritis with rheumatoid factor of multiple sites without organ or systems involvement: Secondary | ICD-10-CM | POA: Diagnosis not present

## 2023-09-10 ENCOUNTER — Other Ambulatory Visit: Payer: Self-pay | Admitting: Internal Medicine

## 2023-09-12 ENCOUNTER — Ambulatory Visit (INDEPENDENT_AMBULATORY_CARE_PROVIDER_SITE_OTHER): Admitting: Internal Medicine

## 2023-09-12 ENCOUNTER — Encounter: Payer: Self-pay | Admitting: Internal Medicine

## 2023-09-12 VITALS — BP 100/64 | HR 54 | Temp 97.7°F | Ht 70.0 in | Wt 154.6 lb

## 2023-09-12 DIAGNOSIS — D508 Other iron deficiency anemias: Secondary | ICD-10-CM

## 2023-09-12 DIAGNOSIS — E782 Mixed hyperlipidemia: Secondary | ICD-10-CM | POA: Diagnosis not present

## 2023-09-12 DIAGNOSIS — M71122 Other infective bursitis, left elbow: Secondary | ICD-10-CM | POA: Diagnosis not present

## 2023-09-12 DIAGNOSIS — E538 Deficiency of other specified B group vitamins: Secondary | ICD-10-CM | POA: Diagnosis not present

## 2023-09-12 DIAGNOSIS — E1165 Type 2 diabetes mellitus with hyperglycemia: Secondary | ICD-10-CM

## 2023-09-12 DIAGNOSIS — Z0000 Encounter for general adult medical examination without abnormal findings: Secondary | ICD-10-CM | POA: Diagnosis not present

## 2023-09-12 DIAGNOSIS — I10 Essential (primary) hypertension: Secondary | ICD-10-CM

## 2023-09-12 DIAGNOSIS — E559 Vitamin D deficiency, unspecified: Secondary | ICD-10-CM | POA: Diagnosis not present

## 2023-09-12 DIAGNOSIS — Z0001 Encounter for general adult medical examination with abnormal findings: Secondary | ICD-10-CM

## 2023-09-12 DIAGNOSIS — Z7984 Long term (current) use of oral hypoglycemic drugs: Secondary | ICD-10-CM

## 2023-09-12 LAB — BASIC METABOLIC PANEL WITH GFR
BUN: 18 mg/dL (ref 6–23)
CO2: 27 meq/L (ref 19–32)
Calcium: 9.6 mg/dL (ref 8.4–10.5)
Chloride: 100 meq/L (ref 96–112)
Creatinine, Ser: 0.62 mg/dL (ref 0.40–1.50)
GFR: 91.62 mL/min (ref >60.00)
Glucose, Bld: 160 mg/dL — ABNORMAL HIGH (ref 70–99)
Potassium: 4.4 meq/L (ref 3.5–5.1)
Sodium: 135 meq/L (ref 135–145)

## 2023-09-12 LAB — LIPID PANEL
Cholesterol: 139 mg/dL (ref 0–200)
HDL: 74.5 mg/dL (ref >39.00)
LDL Cholesterol: 41 mg/dL (ref 0–99)
NonHDL: 64.7
Total CHOL/HDL Ratio: 2
Triglycerides: 117 mg/dL (ref 0.0–149.0)
VLDL: 23.4 mg/dL (ref 0.0–40.0)

## 2023-09-12 LAB — MICROALBUMIN / CREATININE URINE RATIO
Creatinine,U: 56.6 mg/dL
Microalb Creat Ratio: 23.8 mg/g (ref 0.0–30.0)
Microalb, Ur: 1.3 mg/dL (ref 0.0–1.9)

## 2023-09-12 LAB — URINALYSIS, ROUTINE W REFLEX MICROSCOPIC
Bilirubin Urine: NEGATIVE
Hgb urine dipstick: NEGATIVE
Ketones, ur: NEGATIVE
Leukocytes,Ua: NEGATIVE
Nitrite: NEGATIVE
RBC / HPF: NONE SEEN (ref 0–?)
Specific Gravity, Urine: 1.015 (ref 1.000–1.030)
Total Protein, Urine: NEGATIVE
Urine Glucose: >=1000 — AB
Urobilinogen, UA: 0.2 (ref 0.0–1.0)
pH: 6 (ref 5.0–8.0)

## 2023-09-12 LAB — IBC PANEL
Iron: 62 ug/dL (ref 42–165)
Saturation Ratios: 19.3 % — ABNORMAL LOW (ref 20.0–50.0)
TIBC: 320.6 ug/dL (ref 250.0–450.0)
Transferrin: 229 mg/dL (ref 212.0–360.0)

## 2023-09-12 LAB — HEPATIC FUNCTION PANEL
ALT: 20 U/L (ref 0–53)
AST: 20 U/L (ref 0–37)
Albumin: 4.2 g/dL (ref 3.5–5.2)
Alkaline Phosphatase: 49 U/L (ref 39–117)
Bilirubin, Direct: 0 mg/dL (ref 0.0–0.3)
Total Bilirubin: 0.3 mg/dL (ref 0.2–1.2)
Total Protein: 7 g/dL (ref 6.0–8.3)

## 2023-09-12 LAB — CBC WITH DIFFERENTIAL/PLATELET
Basophils Absolute: 0 K/uL (ref 0.0–0.1)
Basophils Relative: 0.3 % (ref 0.0–3.0)
Eosinophils Absolute: 0.1 K/uL (ref 0.0–0.7)
Eosinophils Relative: 1 % (ref 0.0–5.0)
HCT: 39.8 % (ref 39.0–52.0)
Hemoglobin: 13.4 g/dL (ref 13.0–17.0)
Lymphocytes Relative: 9 % — ABNORMAL LOW (ref 12.0–46.0)
Lymphs Abs: 0.7 K/uL (ref 0.7–4.0)
MCHC: 33.7 g/dL (ref 30.0–36.0)
MCV: 89.5 fl (ref 78.0–100.0)
Monocytes Absolute: 0.3 K/uL (ref 0.1–1.0)
Monocytes Relative: 3.4 % (ref 3.0–12.0)
Neutro Abs: 6.3 K/uL (ref 1.4–7.7)
Neutrophils Relative %: 86.3 % — ABNORMAL HIGH (ref 43.0–77.0)
Platelets: 273 K/uL (ref 150.0–400.0)
RBC: 4.45 Mil/uL (ref 4.22–5.81)
RDW: 13.5 % (ref 11.5–15.5)
WBC: 7.4 K/uL (ref 4.0–10.5)

## 2023-09-12 LAB — HEMOGLOBIN A1C: Hgb A1c MFr Bld: 7.6 % — ABNORMAL HIGH (ref 4.6–6.5)

## 2023-09-12 LAB — VITAMIN D 25 HYDROXY (VIT D DEFICIENCY, FRACTURES): VITD: 53.04 ng/mL (ref 30.00–100.00)

## 2023-09-12 LAB — FERRITIN: Ferritin: 31.5 ng/mL (ref 22.0–322.0)

## 2023-09-12 LAB — TSH: TSH: 1.32 u[IU]/mL (ref 0.35–5.50)

## 2023-09-12 LAB — VITAMIN B12: Vitamin B-12: 359 pg/mL (ref 211–911)

## 2023-09-12 MED ORDER — TRAZODONE HCL 50 MG PO TABS: ORAL_TABLET | ORAL | 1 refills | Status: DC

## 2023-09-12 MED ORDER — CARVEDILOL 12.5 MG PO TABS: 12.5000 mg | ORAL_TABLET | Freq: Two times a day (BID) | ORAL | 3 refills | Status: DC

## 2023-09-12 MED ORDER — DOXYCYCLINE HYCLATE 100 MG PO TABS: 100.0000 mg | ORAL_TABLET | Freq: Two times a day (BID) | ORAL | 0 refills | Status: DC

## 2023-09-12 NOTE — Assessment & Plan Note (Signed)
 Lab Results  Component Value Date   LDLCALC 57 11/19/2022   Stable, pt to continue current statin crestor  20 mg every day, now for f/u lab

## 2023-09-12 NOTE — Assessment & Plan Note (Signed)
 Lab Results  Component Value Date   HGBA1C 6.3 11/19/2022   Stable, pt to continue current medical treatment farxiga  0 every day, metformin  ER 500 mg - 2 every day,

## 2023-09-12 NOTE — Assessment & Plan Note (Signed)

## 2023-09-12 NOTE — Assessment & Plan Note (Signed)
 Mild to mod, for antibx course doxycyline 100 bid,  to f/u any worsening symptoms or concerns

## 2023-09-12 NOTE — Patient Instructions (Signed)
 Please take all new medication as prescribed - antibiotic  Please continue all other medications as before, and refills have been done if requested.  Please have the pharmacy call with any other refills you may need.  Please continue your efforts at being more active, low cholesterol diet, and weight control.  You are otherwise up to date with prevention measures today.  Please keep your appointments with your specialists as you may have planned  Please go to the LAB at the blood drawing area for the tests to be done  You will be contacted by phone if any changes need to be made immediately.  Otherwise, you will receive a letter about your results with an explanation, but please check with MyChart first.  Please make an Appointment to return in 6 months, or sooner if needed

## 2023-09-12 NOTE — Assessment & Plan Note (Signed)
No recent overt bleeding, for f/u iron lab 

## 2023-09-12 NOTE — Assessment & Plan Note (Signed)
 BP Readings from Last 3 Encounters:  09/12/23 100/64  12/30/22 122/60  11/19/22 130/82   Stable, pt to continue medical treatment norvasc  5 mg every day, altace  5 mg every day, coreg  12.5 bid

## 2023-09-12 NOTE — Progress Notes (Signed)
 Patient ID: Phillip [name], male   DOB: Oct 03, 1945, 78 y.o.   MRN: [id number]         Chief Complaint:: wellness exam and Fall (Fall within the last week, left elbow injury. Injury occurred due to fall in shower, recently replaced shower mat. Noticeable swelling and bruising on elbow)  , dm, htn, hld, hx of iron deficiency, low vit d       HPI:  Phillip [name] is a 78 y.o. male here for wellness exam; for tdap at pharmacy, o/w up to date                        Also Pt denies chest pain, increased sob or doe, wheezing, orthopnea, PND, increased LE swelling, palpitations, dizziness or syncope.   Pt denies polydipsia, polyuria, or new focal neuro s/s.    Pt denies fever, wt loss, night sweats, loss of appetite, or other constitutional symptoms  But did have fall to left elbow tip 1 wk ago, had very little swelling to start, and still playing golf, but today with marked worsening elbow tip swelling and tenderness   Wt Readings from Last 3 Encounters:  09/12/23 154 lb 9.6 oz (70.1 kg)  12/30/22 153 lb (69.4 kg)  11/19/22 156 lb (70.8 kg)   BP Readings from Last 3 Encounters:  09/12/23 100/64  12/30/22 122/60  11/19/22 130/82   Immunization History  Administered Date(s) Administered   Fluad Quad(high Dose 65+) 12/08/2018, 03/03/2020, 03/09/2021, 12/04/2021   Fluad Trivalent(High Dose 65+) 11/19/2022   Influenza, High Dose Seasonal PF 01/16/2013, 01/24/2015, 11/11/2016, 11/17/2017   Influenza,inj,Quad PF,6+ Mos 02/27/2014, 12/02/2015   Pneumococcal Conjugate-13 06/01/2016   Pneumococcal Polysaccharide-23 11/17/2017   Zoster Recombinant(Shingrix) 12/08/2018, 05/28/2019   Health Maintenance Due  Topic Date Due   Medicare Annual Wellness (AWV)  Never done   Diabetic kidney evaluation - Urine ACR  Never done   DTaP/Tdap/Td (1 - Tdap) Never done   HEMOGLOBIN A1C  05/19/2023      Past Medical History:  Diagnosis Date   Anemia    Anxiety    Arthritis    Bipolar depression (HCC)    CAD,  multiple vessel    Cancer (HCC)    skin, basal   Chronic pain disorder    chronic bilat shoulder pain and feet pain   Depression    Diabetes mellitus without complication (HCC)    Erectile dysfunction 01/17/2013   GERD (gastroesophageal reflux disease)    History of colon polyps    Hyperlipidemia    Hypertension    Insomnia    Iron deficiency anemia 01/16/2013   Mixed hyperlipidemia 03/08/2013   Posterior neck pain 01/22/2019   Status post insertion of drug-eluting stent into left anterior descending (LAD) artery for coronary artery disease    Stented coronary artery    X 2   Systolic murmur    Unspecified vitamin D  deficiency 01/17/2013   Past Surgical History:  Procedure Laterality Date   CAROTID STENT  2008   CIRCUMCISION N/A 09/30/2021   Procedure: CIRCUMCISION ADULT;  Surgeon: Lovie Arlyss CROME, MD;  Location: [facility];  Service: Urology;  Laterality: N/A;   CORONARY ANGIOPLASTY WITH STENT PLACEMENT  about 2014   current Cardiology: Dr Kennyth   EYE SURGERY      reports that he quit smoking about 21 years ago. His smoking use included cigarettes. He started smoking about 41 years ago. He has a 20 pack-year smoking history. His  smokeless tobacco use includes chew. He reports current alcohol use. He reports that he does not use drugs. family history includes Alzheimer's disease in his brother; Cancer in his maternal grandfather, maternal grandmother, and mother; Heart disease in his maternal grandmother, mother, and paternal grandfather; Hyperlipidemia in his maternal grandmother; Lung cancer in his father; Stroke in his maternal grandmother and paternal grandfather. Allergies  Allergen Reactions   Lipitor [Atorvastatin] Other (See Comments)    Myalgia, was unable to walk per Pt.    Current Outpatient Medications on File Prior to Visit  Medication Sig Dispense Refill   albuterol  (VENTOLIN  HFA) 108 (90 Base) MCG/ACT inhaler Inhale 2 puffs into the lungs every 6 (six) hours as  needed for wheezing or shortness of breath. 8 g 5   ALPRAZolam  (XANAX ) 1 MG tablet TAKE 1 TABLET BY MOUTH TWICE DAILY AS NEEDED 60 tablet 2   amLODipine  (NORVASC ) 5 MG tablet TAKE 1 TABLET BY MOUTH DAILY IN THE MORNING AND AT BEDTIME 60 tablet 2   aspirin 81 MG tablet Take 81 mg by mouth daily.     benzonatate  (TESSALON ) 200 MG capsule Take 1 capsule (200 mg total) by mouth 2 (two) times daily as needed for cough. 20 capsule 0   carbamazepine  (TEGRETOL ) 200 MG tablet Take 1 tablet (200 mg total) by mouth 2 (two) times daily. 180 tablet 1   clobetasol  cream (TEMOVATE ) 0.05 % APPLY TOPICALLY TWICE DAILY AS NEEDED 60 g 1   Clobetasol  Prop Crea-Coal Tar (CLOBETA CREAM EX) Clobeta Cream     clopidogrel  (PLAVIX ) 75 MG tablet 1 tablet Orally Once a day     Cyanocobalamin  (VITAMIN B 12 PO) Take 1 capsule by mouth in the morning and at bedtime.     FARXIGA  10 MG TABS tablet TAKE 1 TABLET BY MOUTH DAILY WITH BREAKFAST 90 tablet 3   fenofibrate  54 MG tablet 1 tablet with a meal Orally Once a day     ferrous sulfate  325 (65 FE) MG EC tablet Take 1 tablet (325 mg total) by mouth 2 (two) times a day. (Patient taking differently: Take 2 tablets by mouth 2 (two) times a day.) 60 tablet 5   folic acid  (FOLVITE ) 1 MG tablet TAKE 1 TABLET BY MOUTH DAILY (Patient taking differently: Take 1 mg by mouth in the morning and at bedtime.) 90 tablet 0   hydroxychloroquine  (PLAQUENIL ) 200 MG tablet TAKE 2 TABLETS BY MOUTH DAILY (Patient taking differently: Take 200 mg by mouth daily.) 60 tablet 5   hydrOXYzine  (ATARAX ) 25 MG tablet TAKE 1 TABLET BY MOUTH 3 TIMES DAILY AS NEEDED 270 tablet 3   ibuprofen (ADVIL) 200 MG tablet Take 400 mg by mouth daily as needed (pain).     leflunomide (ARAVA) 20 MG tablet Take 40 mg by mouth at bedtime.     metFORMIN  (GLUCOPHAGE -XR) 500 MG 24 hr tablet Take 2 tablets (1,000 mg total) by mouth daily with breakfast. 180 tablet 3   nitroGLYCERIN  (NITROSTAT ) 0.4 MG SL tablet Place 1 tablet (0.4  mg total) under the tongue every 5 (five) minutes as needed for chest pain. 25 tablet 3   Omega-3 Fatty Acids (FISH OIL PO) Take 1 capsule by mouth daily.     OVER THE COUNTER MEDICATION Apply 1 Application topically as needed (pain). CBD oil     oxyCODONE-acetaminophen  (PERCOCET/ROXICET) 5-325 MG tablet 1 tablet as needed Orally every 6 hrs     pantoprazole  (PROTONIX ) 40 MG tablet Take 1 tablet (40 mg total) by  mouth 2 (two) times daily. 180 tablet 3   pravastatin  (PRAVACHOL ) 80 MG tablet 1 tablet Orally Once a day     predniSONE  (DELTASONE ) 10 MG tablet 3 tabs by mouth per day for 3 days,2tabs per day for 3 days,1tab per day for 3 days 18 tablet 0   predniSONE  (DELTASONE ) 10 MG tablet TAKE 1 TABLET BY MOUTH DAILY 90 tablet 1   promethazine -dextromethorphan (PROMETHAZINE -DM) 6.25-15 MG/5ML syrup Take 5 mLs by mouth 4 (four) times daily as needed. 118 mL 1   ramipril  (ALTACE ) 5 MG capsule Take 1 capsule (5 mg total) by mouth daily. 90 capsule 3   rosuvastatin  (CRESTOR ) 20 MG tablet Take 1 tablet (20 mg total) by mouth daily. 90 tablet 3   sildenafil  (REVATIO ) 20 MG tablet Take 3 pills daily as needed 60 tablet 2   tamsulosin  (FLOMAX ) 0.4 MG CAPS capsule Take 1 capsule (0.4 mg total) by mouth daily. 90 capsule 3   triamcinolone  (NASACORT  AQ) 55 MCG/ACT AERO nasal inhaler Place 2 sprays into the nose daily. 1 Inhaler 12   No current facility-administered medications on file prior to visit.        ROS:  All others reviewed and negative.  Objective        PE:  BP 100/64   Pulse (!) 54   Temp 97.7 F (36.5 C)   Ht 5' 10 (1.778 m)   Wt 154 lb 9.6 oz (70.1 kg)   SpO2 97%   BMI 22.18 kg/m                 Constitutional: Pt appears in NAD               HENT: Head: NCAT.                Right Ear: External ear normal.                 Left Ear: External ear normal.                Eyes: . Pupils are equal, round, and reactive to light. Conjunctivae and EOM are normal               Nose:  without d/c or deformity               Neck: Neck supple. Gross normal ROM               Cardiovascular: Normal rate and regular rhythm.                 Pulmonary/Chest: Effort normal and breath sounds without rales or wheezing.                Abd:  Soft, NT, ND, + BS, no organomegaly               Neurological: Pt is alert. At baseline orientation, motor grossly intact               Skin: Skin is warm. LE edema - none, left elbow with bursa marked 2-3+ redness, tender, swelling               Psychiatric: Pt behavior is normal without agitation   Micro: none  Cardiac tracings I have personally interpreted today:  none  Pertinent Radiological findings (summarize): none   Lab Results  Component Value Date   WBC 5.9 06/07/2022   HGB 13.4 06/07/2022   HCT 40.9 06/07/2022   PLT 273.0  06/07/2022   GLUCOSE 131 (H) 11/19/2022   CHOL 163 11/19/2022   TRIG 71.0 11/19/2022   HDL 91.90 11/19/2022   LDLDIRECT 142.5 02/27/2014   LDLCALC 57 11/19/2022   ALT 20 11/19/2022   AST 16 11/19/2022   NA 137 11/19/2022   K 3.9 11/19/2022   CL 99 11/19/2022   CREATININE 0.57 11/19/2022   BUN 6 11/19/2022   CO2 29 11/19/2022   TSH 1.98 06/07/2022   PSA 0.64 06/07/2022   INR 1.0 09/15/2008   HGBA1C 6.3 11/19/2022   Assessment/Plan:  Phillip [name] is a 78 y.o. White or Caucasian [1] male with  has a past medical history of Anemia, Anxiety, Arthritis, Bipolar depression (HCC), CAD, multiple vessel, Cancer (HCC), Chronic pain disorder, Depression, Diabetes mellitus without complication (HCC), Erectile dysfunction (01/17/2013), GERD (gastroesophageal reflux disease), History of colon polyps, Hyperlipidemia, Hypertension, Insomnia, Iron deficiency anemia (01/16/2013), Mixed hyperlipidemia (03/08/2013), Posterior neck pain (01/22/2019), Status post insertion of drug-eluting stent into left anterior descending (LAD) artery for coronary artery disease, Stented coronary artery, Systolic murmur, and Unspecified  vitamin D  deficiency (01/17/2013).  Encounter for well adult exam with abnormal findings Age and sex appropriate education and counseling updated with regular exercise and diet Referrals for preventative services - none needed Immunizations addressed - for tdap at pharmacy Smoking counseling  - none needed Evidence for depression or other mood disorder - none significant Most recent labs reviewed. I have personally reviewed and have noted: 1) the patient's medical and social history 2) The patient's current medications and supplements 3) The patient's height, weight, and BMI have been recorded in the chart   Diabetes [facility]) Lab Results  Component Value Date   HGBA1C 6.3 11/19/2022   Stable, pt to continue current medical treatment farxiga  0 every day, metformin  ER 500 mg - 2 every day,    Hyperlipidemia Lab Results  Component Value Date   LDLCALC 57 11/19/2022   Stable, pt to continue current statin crestor  20 mg every day, now for f/u lab   Hypertension BP Readings from Last 3 Encounters:  09/12/23 100/64  12/30/22 122/60  11/19/22 130/82   Stable, pt to continue medical treatment norvasc  5 mg every day, altace  5 mg every day, coreg  12.5 bid   Iron deficiency anemia No recent overt bleeding, for f/u iron lab  Other infective bursitis, left elbow Mild to mod, for antibx course doxycyline 100 bid,  to f/u any worsening symptoms or concerns  Vitamin D  deficiency Last vitamin D  Lab Results  Component Value Date   VD25OH 49.20 06/07/2022   Stable, cont oral replacement  Followup: Return in about 6 months (around 03/14/2024).  Phillip Rush, MD 09/12/2023 2:12 PM [facility]

## 2023-09-12 NOTE — Assessment & Plan Note (Signed)
Last vitamin D Lab Results  Component Value Date   VD25OH 49.20 06/07/2022   Stable, cont oral replacement

## 2023-09-13 ENCOUNTER — Ambulatory Visit: Payer: Self-pay | Admitting: Internal Medicine

## 2023-09-13 ENCOUNTER — Other Ambulatory Visit: Payer: Self-pay | Admitting: Internal Medicine

## 2023-09-13 MED ORDER — METFORMIN HCL ER 500 MG PO TB24: 1500.0000 mg | ORAL_TABLET | Freq: Every day | ORAL | 3 refills | Status: DC

## 2023-09-22 ENCOUNTER — Other Ambulatory Visit: Payer: Self-pay | Admitting: Internal Medicine

## 2023-09-27 ENCOUNTER — Other Ambulatory Visit: Payer: Self-pay | Admitting: Internal Medicine

## 2023-10-17 ENCOUNTER — Other Ambulatory Visit: Payer: Self-pay | Admitting: Internal Medicine

## 2023-10-17 DIAGNOSIS — E785 Hyperlipidemia, unspecified: Secondary | ICD-10-CM

## 2023-11-23 ENCOUNTER — Ambulatory Visit: Attending: Cardiovascular Disease | Admitting: Cardiovascular Disease

## 2023-11-23 ENCOUNTER — Encounter: Payer: Self-pay | Admitting: Cardiovascular Disease

## 2023-11-23 VITALS — BP 130/58 | HR 61 | Ht 70.0 in | Wt 154.4 lb

## 2023-11-23 DIAGNOSIS — I10 Essential (primary) hypertension: Secondary | ICD-10-CM | POA: Diagnosis not present

## 2023-11-23 DIAGNOSIS — I2510 Atherosclerotic heart disease of native coronary artery without angina pectoris: Secondary | ICD-10-CM | POA: Diagnosis not present

## 2023-11-23 DIAGNOSIS — E785 Hyperlipidemia, unspecified: Secondary | ICD-10-CM

## 2023-11-23 DIAGNOSIS — E1159 Type 2 diabetes mellitus with other circulatory complications: Secondary | ICD-10-CM | POA: Diagnosis not present

## 2023-11-23 NOTE — Progress Notes (Signed)
 Cardiology Clinic Note   Date: 11/23/2023 ID: HOWARD BUNTE, DOB September 08, 1945, MRN [id number]  Primary Cardiologist:  Jerel Balding, MD  Clinical history    Phillip [name] is a 78 y.o. male who presents to the clinic today for follow-up of CAD, on a background of type 2 diabetes mellitus, hypertension, hypercholesterolemia and GERD.    He denies any interval problems with chest pain.  He plays golf frequently (4 times this week) .  He rides the cart, but is able to climb hills to the grain without any complaints of shortness of breath or chest pain.  He does not have claudication.  He denies lower extremity edema, orthopnea, PND palpitations,  syncope or bleeding problems.  Does not smoke, but continues to drink 4-6 beers a day  Metabolic control is fair.  Hemoglobin A1c is 7.6%.  He has an excellent LDL cholesterol 41 and HDL cholesterol of 74.  Triglycerides are normal.  He has noticed occasional dizziness with sudden changes in position.  His blood pressure was high until Dr. Norleen increased his amlodipine  from 2.5 mg to 5 mg about 6 months ago.  However, he now has had some borderline blood pressure recordings including 100/64 in July and a diastolic blood pressure of only 58 today.  In addition to ramipril , amlodipine  and carvedilol , he also takes tamsulosin .  He does have a home blood pressure cuff.  Past medical history significant for: CAD. LHC 11/07/2000 (NSTEMI): Proximal Cx 60 to 70%.  Distal Cx 40%.  Nonobstructive CAD treated medically. LHC 06/03/2006 (positive CTA): Proximal RCA 30%.  Mid RCA 40%.  Mid to distal Cx 30%.  Ostial RI 90%.  PCI with stent to RI. LHC 06/18/2008 (unstable angina): Ostial LM 40%.  Proximal LAD 60%.  Mid LAD 60%.  RI proximal in-stent restenosis 40%.  Mid RI 20%.  Proximal OM1 80%.  Proximal RCA 40%.  Mid RCA 40%.  PCI with DES to OM1. Nuclear stress test 05/07/2022: Normal perfusion, EF 49% Echo 08/14/2018: EF 60 to 65%.  Moderately increased LV wall  thickness.  Mild thickening/calcification of aortic valve.  ROS: All other systems reviewed and are otherwise negative except as noted in History of Present Illness.  Studies Reviewed     EKG Interpretation Date/Time:  Wednesday November 23 2023 09:04:37 EDT Ventricular Rate:  61 PR Interval:  212 QRS Duration:  100 QT Interval:  428 QTC Calculation: 430 R Axis:   -17  Text Interpretation: Sinus rhythm with 1st degree A-V block Cannot rule out Inferior infarct , age undetermined When compared with ECG of 25-Sep-2021 09:21, No significant change since last tracing Confirmed by Wilfredo Canterbury [phone]) on 11/23/2023 9:10:06 AM         Physical Exam    VS:  BP (!) 130/58 (BP Location: Left Arm, Patient Position: Sitting, Cuff Size: Normal)   Pulse 61   Ht 5' 10 (1.778 m)   Wt 154 lb 6.4 oz (70 kg)   SpO2 98%   BMI 22.15 kg/m  , BMI Body mass index is 22.15 kg/m.   General: Alert, oriented x3, no distress, lean Head: no evidence of trauma, PERRL, EOMI, no exophtalmos or lid lag, no myxedema, no xanthelasma; normal ears, nose and oropharynx Neck: normal jugular venous pulsations and no hepatojugular reflux; brisk carotid pulses without delay and no carotid bruits Chest: clear to auscultation, no signs of consolidation by percussion or palpation, normal fremitus, symmetrical and full respiratory excursions Cardiovascular: normal position and quality of  the apical impulse, regular rhythm, normal first and second heart sounds, no murmurs, rubs or gallops Abdomen: no tenderness or distention, no masses by palpation, no abnormal pulsatility or arterial bruits, normal bowel sounds, no hepatosplenomegaly Extremities: no clubbing, cyanosis or edema; 2+ radial, ulnar and brachial pulses bilaterally; 2+ right femoral, posterior tibial and dorsalis pedis pulses; 2+ left femoral, posterior tibial and dorsalis pedis pulses; no subclavian or femoral bruits Neurological: grossly nonfocal Psych:  Normal mood and affect   Assessment & Plan   CAD: Asymptomatic despite a very active lifestyle.  S/p PCI with stent to RI April 2008, PCI with DES to OM1 April 2010.  Normal perfusion on nuclear stress test March 2024, but EF 49%.  Echo June 2020 showed EF 60 to 65%.    Continue aspirin, carvedilol , rosuvastatin .  Hypertension: DBP today a little low at 58.  Occasional orthostatic dizziness.  Continue amlodipine , carvedilol , ramipril .  Asked him to keep a log of blood pressure check once daily for a week and send it to me.  We may have to retreat a little bit on some of his medications if he has diastolic blood pressure less than 60 consistently.  Also reminded him to drink plenty of fluids since Farxiga  has a natural diuretic effect. Hyperlipidemia:  Excellent lipid parameters on current rosuvastatin  prescription.  Continue it. DM: Fair control, albeit not ideal.  He is almost 78 years old and I think A1c less than 8% is a reasonable target although would prefer better control at A1c less than 7%.  Jerel Balding, MD, [facility] [phone] office [phone] pager

## 2023-11-23 NOTE — Patient Instructions (Signed)
 Medication Instructions:  No changes *If you need a refill on your cardiac medications before your next appointment, please call your pharmacy*  Please send a weeks worth of BP readings through MyChart.   Lab Work: None ordered If you have labs (blood work) drawn today and your tests are completely normal, you will receive your results only by: MyChart Message (if you have MyChart) OR A paper copy in the mail If you have any lab test that is abnormal or we need to change your treatment, we will call you to review the results.  Testing/Procedures: None ordered  Follow-Up: At [facility], you and your health needs are our priority.  As part of our continuing mission to provide you with exceptional heart care, our providers are all part of one team.  This team includes your primary Cardiologist (physician) and Advanced Practice Providers or APPs (Physician Assistants and Nurse Practitioners) who all work together to provide you with the care you need, when you need it.  Your next appointment:   1 year(s)  Provider:   Jerel Balding, MD    We recommend signing up for the patient portal called MyChart.  Sign up information is provided on this After Visit Summary.  MyChart is used to connect with patients for Virtual Visits (Telemedicine).  Patients are able to view lab/test results, encounter notes, upcoming appointments, etc.  Non-urgent messages can be sent to your provider as well.   To learn more about what you can do with MyChart, go to [email or web address].

## 2023-11-29 ENCOUNTER — Other Ambulatory Visit: Payer: Self-pay | Admitting: Cardiovascular Disease

## 2023-12-30 ENCOUNTER — Other Ambulatory Visit: Payer: Self-pay | Admitting: Internal Medicine

## 2023-12-30 DIAGNOSIS — G4700 Insomnia, unspecified: Secondary | ICD-10-CM | POA: Diagnosis not present

## 2023-12-30 DIAGNOSIS — D84821 Immunodeficiency due to drugs: Secondary | ICD-10-CM | POA: Diagnosis not present

## 2024-01-30 ENCOUNTER — Other Ambulatory Visit: Payer: Self-pay | Admitting: Internal Medicine

## 2024-02-13 ENCOUNTER — Other Ambulatory Visit: Payer: Self-pay | Admitting: Internal Medicine

## 2024-02-18 ENCOUNTER — Other Ambulatory Visit: Payer: Self-pay | Admitting: Internal Medicine

## 2024-03-13 ENCOUNTER — Other Ambulatory Visit: Payer: Self-pay | Admitting: Internal Medicine

## 2024-03-14 ENCOUNTER — Ambulatory Visit: Admitting: Family Medicine

## 2024-03-14 ENCOUNTER — Other Ambulatory Visit: Payer: Self-pay

## 2024-03-14 ENCOUNTER — Ambulatory Visit: Payer: Self-pay | Admitting: Internal Medicine

## 2024-03-14 ENCOUNTER — Ambulatory Visit

## 2024-03-14 ENCOUNTER — Encounter: Payer: Self-pay | Admitting: Internal Medicine

## 2024-03-14 ENCOUNTER — Ambulatory Visit (INDEPENDENT_AMBULATORY_CARE_PROVIDER_SITE_OTHER): Admitting: Internal Medicine

## 2024-03-14 ENCOUNTER — Encounter: Payer: Self-pay | Admitting: Family Medicine

## 2024-03-14 VITALS — BP 130/70 | HR 56 | Ht 70.0 in | Wt 163.0 lb

## 2024-03-14 VITALS — BP 122/78 | HR 55 | Temp 97.9°F | Ht 70.0 in | Wt 163.0 lb

## 2024-03-14 DIAGNOSIS — D508 Other iron deficiency anemias: Secondary | ICD-10-CM | POA: Diagnosis not present

## 2024-03-14 DIAGNOSIS — M25511 Pain in right shoulder: Secondary | ICD-10-CM

## 2024-03-14 DIAGNOSIS — I10 Essential (primary) hypertension: Secondary | ICD-10-CM | POA: Diagnosis not present

## 2024-03-14 DIAGNOSIS — Z23 Encounter for immunization: Secondary | ICD-10-CM

## 2024-03-14 DIAGNOSIS — G8929 Other chronic pain: Secondary | ICD-10-CM

## 2024-03-14 DIAGNOSIS — E1159 Type 2 diabetes mellitus with other circulatory complications: Secondary | ICD-10-CM | POA: Diagnosis not present

## 2024-03-14 DIAGNOSIS — E782 Mixed hyperlipidemia: Secondary | ICD-10-CM

## 2024-03-14 DIAGNOSIS — E559 Vitamin D deficiency, unspecified: Secondary | ICD-10-CM

## 2024-03-14 DIAGNOSIS — E785 Hyperlipidemia, unspecified: Secondary | ICD-10-CM | POA: Diagnosis not present

## 2024-03-14 LAB — HEPATIC FUNCTION PANEL
ALT: 18 U/L (ref 3–53)
AST: 17 U/L (ref 5–37)
Albumin: 4.1 g/dL (ref 3.5–5.2)
Alkaline Phosphatase: 54 U/L (ref 39–117)
Bilirubin, Direct: 0 mg/dL — ABNORMAL LOW (ref 0.1–0.3)
Total Bilirubin: 0.5 mg/dL (ref 0.2–1.2)
Total Protein: 7 g/dL (ref 6.0–8.3)

## 2024-03-14 LAB — IBC PANEL
Iron: 82 ug/dL (ref 42–165)
Saturation Ratios: 22.4 % (ref 20.0–50.0)
TIBC: 365.4 ug/dL (ref 250.0–450.0)
Transferrin: 261 mg/dL (ref 212.0–360.0)

## 2024-03-14 LAB — LIPID PANEL
Cholesterol: 138 mg/dL (ref 28–200)
HDL: 73.7 mg/dL
LDL Cholesterol: 44 mg/dL (ref 10–99)
NonHDL: 63.92
Total CHOL/HDL Ratio: 2
Triglycerides: 98 mg/dL (ref 10.0–149.0)
VLDL: 19.6 mg/dL (ref 0.0–40.0)

## 2024-03-14 LAB — BASIC METABOLIC PANEL WITH GFR
BUN: 10 mg/dL (ref 6–23)
CO2: 31 meq/L (ref 19–32)
Calcium: 9.4 mg/dL (ref 8.4–10.5)
Chloride: 97 meq/L (ref 96–112)
Creatinine, Ser: 0.76 mg/dL (ref 0.40–1.50)
GFR: 85.86 mL/min
Glucose, Bld: 145 mg/dL — ABNORMAL HIGH (ref 70–99)
Potassium: 3.8 meq/L (ref 3.5–5.1)
Sodium: 136 meq/L (ref 135–145)

## 2024-03-14 LAB — VITAMIN D 25 HYDROXY (VIT D DEFICIENCY, FRACTURES): VITD: 62.69 ng/mL (ref 30.00–100.00)

## 2024-03-14 LAB — HEMOGLOBIN A1C: Hgb A1c MFr Bld: 8 % — ABNORMAL HIGH (ref 4.6–6.5)

## 2024-03-14 LAB — FERRITIN: Ferritin: 42.5 ng/mL (ref 22.0–322.0)

## 2024-03-14 MED ORDER — METFORMIN HCL ER 500 MG PO TB24: 1500.0000 mg | ORAL_TABLET | Freq: Every day | ORAL | 3 refills | Status: AC

## 2024-03-14 MED ORDER — AMLODIPINE BESYLATE 5 MG PO TABS: ORAL_TABLET | ORAL | 3 refills | Status: AC

## 2024-03-14 MED ORDER — ALBUTEROL SULFATE HFA 108 (90 BASE) MCG/ACT IN AERS: 2.0000 | INHALATION_SPRAY | Freq: Four times a day (QID) | RESPIRATORY_TRACT | 5 refills | Status: AC | PRN

## 2024-03-14 MED ORDER — DAPAGLIFLOZIN PROPANEDIOL 10 MG PO TABS: 10.0000 mg | ORAL_TABLET | Freq: Every day | ORAL | 3 refills | Status: AC

## 2024-03-14 MED ORDER — TAMSULOSIN HCL 0.4 MG PO CAPS: 0.4000 mg | ORAL_CAPSULE | Freq: Every day | ORAL | 3 refills | Status: AC

## 2024-03-14 MED ORDER — CARVEDILOL 12.5 MG PO TABS: 12.5000 mg | ORAL_TABLET | Freq: Two times a day (BID) | ORAL | 3 refills | Status: AC

## 2024-03-14 MED ORDER — ALPRAZOLAM 1 MG PO TABS: 1.0000 mg | ORAL_TABLET | Freq: Two times a day (BID) | ORAL | 2 refills | Status: AC | PRN

## 2024-03-14 MED ORDER — PREDNISONE 10 MG PO TABS: 10.0000 mg | ORAL_TABLET | Freq: Every day | ORAL | Status: AC

## 2024-03-14 MED ORDER — PRAVASTATIN SODIUM 80 MG PO TABS: 80.0000 mg | ORAL_TABLET | Freq: Every day | ORAL | 3 refills | Status: AC

## 2024-03-14 MED ORDER — RAMIPRIL 5 MG PO CAPS: 5.0000 mg | ORAL_CAPSULE | Freq: Every day | ORAL | 3 refills | Status: AC

## 2024-03-14 MED ORDER — TRAZODONE HCL 50 MG PO TABS: 50.0000 mg | ORAL_TABLET | Freq: Every day | ORAL | 1 refills | Status: AC

## 2024-03-14 MED ORDER — ROSUVASTATIN CALCIUM 20 MG PO TABS: 20.0000 mg | ORAL_TABLET | Freq: Every day | ORAL | 3 refills | Status: AC

## 2024-03-14 MED ORDER — PANTOPRAZOLE SODIUM 40 MG PO TBEC: 40.0000 mg | DELAYED_RELEASE_TABLET | Freq: Two times a day (BID) | ORAL | 3 refills | Status: AC

## 2024-03-14 NOTE — Progress Notes (Unsigned)
 Patient ID: Phillip [name], male   DOB: 01/13/1946, 79 y.o.   MRN: [id number]        Chief Complaint: follow up right shoulder pain, dm, hld, htn, iron deficiency, low vit d       HPI:  Phillip [name] is a 79 y.o. male here with c/o >1 mo right shoulder pain with reduced ROM after fell against a hard door frame.  Pt denies chest pain, increased sob or doe, wheezing, orthopnea, PND, increased LE swelling, palpitations, dizziness or syncope.   Pt denies polydipsia, polyuria, or new focal neuro s/s.   No overt bleeding  Due for flu shot       Wt Readings from Last 3 Encounters:  03/14/24 163 lb (73.9 kg)  03/14/24 163 lb (73.9 kg)  11/23/23 154 lb 6.4 oz (70 kg)   BP Readings from Last 3 Encounters:  03/14/24 130/70  03/14/24 122/78  11/23/23 (!) 130/58         Past Medical History:  Diagnosis Date   Anemia    Anxiety    Arthritis    Bipolar depression (HCC)    CAD, multiple vessel    Cancer (HCC)    skin, basal   Chronic pain disorder    chronic bilat shoulder pain and feet pain   Depression    Diabetes mellitus without complication (HCC)    Erectile dysfunction 01/17/2013   GERD (gastroesophageal reflux disease)    History of colon polyps    Hyperlipidemia    Hypertension    Insomnia    Iron deficiency anemia 01/16/2013   Mixed hyperlipidemia 03/08/2013   Posterior neck pain 01/22/2019   Status post insertion of drug-eluting stent into left anterior descending (LAD) artery for coronary artery disease    Stented coronary artery    X 2   Systolic murmur    Unspecified vitamin D  deficiency 01/17/2013   Past Surgical History:  Procedure Laterality Date   CAROTID STENT  2008   CIRCUMCISION N/A 09/30/2021   Procedure: CIRCUMCISION ADULT;  Surgeon: Lovie Arlyss CROME, MD;  Location: [facility];  Service: Urology;  Laterality: N/A;   CORONARY ANGIOPLASTY WITH STENT PLACEMENT  about 2014   current Cardiology: Dr Kennyth   EYE SURGERY      reports that he quit smoking about 22  years ago. His smoking use included cigarettes. He started smoking about 42 years ago. He has a 20 pack-year smoking history. His smokeless tobacco use includes chew. He reports current alcohol use. He reports that he does not use drugs. family history includes Alzheimer's disease in his brother; Cancer in his maternal grandfather, maternal grandmother, and mother; Heart disease in his maternal grandmother, mother, and paternal grandfather; Hyperlipidemia in his maternal grandmother; Lung cancer in his father; Stroke in his maternal grandmother and paternal grandfather. Allergies[1] Medications Ordered Prior to Encounter[2]      ROS:  All others reviewed and negative.  Objective        PE:  BP 122/78 (BP Location: Left Arm, Patient Position: Sitting, Cuff Size: Normal)   Pulse (!) 55   Temp 97.9 F (36.6 C) (Oral)   Ht 5' 10 (1.778 m)   Wt 163 lb (73.9 kg)   SpO2 98%   BMI 23.39 kg/m                 Constitutional: Pt appears in NAD               HENT: Head: NCAT.  Right Ear: External ear normal.                 Left Ear: External ear normal.                Eyes: . Pupils are equal, round, and reactive to light. Conjunctivae and EOM are normal               Nose: without d/c or deformity               Neck: Neck supple. Gross normal ROM               Cardiovascular: Normal rate and regular rhythm.                 Pulmonary/Chest: Effort normal and breath sounds without rales or wheezing.                Abd:  Soft, NT, ND, + BS, no organomegaly               Right shoulder with diffuse mild tender, only able to abduct to 90 degrees with pain               Neurological: Pt is alert. At baseline orientation, motor grossly intact               Skin: Skin is warm. No rashes, no other new lesions, LE edema - none               Psychiatric: Pt behavior is normal without agitation   Micro: none  Cardiac tracings I have personally interpreted today:  none  Pertinent  Radiological findings (summarize): none   Lab Results  Component Value Date   WBC 7.4 09/12/2023   HGB 13.4 09/12/2023   HCT 39.8 09/12/2023   PLT 273.0 09/12/2023   GLUCOSE 145 (H) 03/14/2024   CHOL 138 03/14/2024   TRIG 98.0 03/14/2024   HDL 73.70 03/14/2024   LDLDIRECT 142.5 02/27/2014   LDLCALC 44 03/14/2024   ALT 18 03/14/2024   AST 17 03/14/2024   NA 136 03/14/2024   K 3.8 03/14/2024   CL 97 03/14/2024   CREATININE 0.76 03/14/2024   BUN 10 03/14/2024   CO2 31 03/14/2024   TSH 1.32 09/12/2023   PSA 0.64 06/07/2022   INR 1.0 09/15/2008   HGBA1C 8.0 (H) 03/14/2024   MICROALBUR 1.3 09/12/2023   Assessment/Plan:  Phillip [name] is a 79 y.o. White or Caucasian [1] male with  has a past medical history of Anemia, Anxiety, Arthritis, Bipolar depression (HCC), CAD, multiple vessel, Cancer (HCC), Chronic pain disorder, Depression, Diabetes mellitus without complication (HCC), Erectile dysfunction (01/17/2013), GERD (gastroesophageal reflux disease), History of colon polyps, Hyperlipidemia, Hypertension, Insomnia, Iron deficiency anemia (01/16/2013), Mixed hyperlipidemia (03/08/2013), Posterior neck pain (01/22/2019), Status post insertion of drug-eluting stent into left anterior descending (LAD) artery for coronary artery disease, Stented coronary artery, Systolic murmur, and Unspecified vitamin D  deficiency (01/17/2013).  Diabetes (HCC) Lab Results  Component Value Date   HGBA1C 8.0 (H) 03/14/2024   uncontrolled, pt to continue current medical treatment farxiga  10 every day, and increased metformin  ER 500 mg to 3 qd   Hyperlipidemia Lab Results  Component Value Date   LDLCALC 44 03/14/2024   Stable, pt to continue current statin crestor  20 mg qd   Hypertension BP Readings from Last 3 Encounters:  03/14/24 130/70  03/14/24 122/78  11/23/23 (!) 130/58   Stable, pt to continue medical treatment norvasc   5 every day, coreg  12.5 bid, altace  5 qd   Iron deficiency  anemia No overt bleeding - for f//u iron ferritin with labs  Right shoulder pain Exam c/w likely rot cuff disorder - for sport medicine referral  Vitamin D  deficiency Last vitamin D  Lab Results  Component Value Date   VD25OH 62.69 03/14/2024   Stable, cont oral replacement  Followup: Return in about 6 months (around 09/11/2024).  Phillip Rush, MD 03/15/2024 9:03 PM [facility]     [1]  Allergies Allergen Reactions   Lipitor [Atorvastatin] Other (See Comments)    Myalgia, was unable to walk per Pt.   [2]  Current Outpatient Medications on File Prior to Visit  Medication Sig Dispense Refill   aspirin 81 MG tablet Take 81 mg by mouth daily.     carbamazepine  (TEGRETOL ) 200 MG tablet TAKE ONE TABLET BY MOUTH TWICE DAILY 180 tablet 1   clobetasol  cream (TEMOVATE ) 0.05 % APPLY TOPICALLY TWICE DAILY AS NEEDED 60 g 1   Clobetasol  Prop Crea-Coal Tar (CLOBETA CREAM EX) Clobeta Cream     clopidogrel  (PLAVIX ) 75 MG tablet 1 tablet Orally Once a day     Cyanocobalamin  (VITAMIN B 12 PO) Take 1 capsule by mouth in the morning and at bedtime.     fenofibrate  54 MG tablet 1 tablet with a meal Orally Once a day     ferrous sulfate  325 (65 FE) MG EC tablet Take 1 tablet (325 mg total) by mouth 2 (two) times a day. 60 tablet 5   folic acid  (FOLVITE ) 1 MG tablet TAKE 1 TABLET BY MOUTH DAILY 90 tablet 0   hydroxychloroquine  (PLAQUENIL ) 200 MG tablet TAKE 2 TABLETS BY MOUTH DAILY (Patient taking differently: Take 200 mg by mouth 2 (two) times daily.) 60 tablet 5   hydrOXYzine  (ATARAX ) 25 MG tablet TAKE 1 TABLET BY MOUTH 3 TIMES DAILY AS NEEDED 270 tablet 2   ibuprofen (ADVIL) 200 MG tablet Take 400 mg by mouth daily as needed (pain).     leflunomide (ARAVA) 20 MG tablet Take 40 mg by mouth at bedtime.     nitroGLYCERIN  (NITROSTAT ) 0.4 MG SL tablet Place 1 tablet (0.4 mg total) under the tongue every 5 (five) minutes as needed for  chest pain. 25 tablet 3   Omega-3 Fatty Acids (FISH OIL PO) Take 1 capsule by mouth daily.     OVER THE COUNTER MEDICATION Apply 1 Application topically as needed (pain). CBD oil     oxyCODONE-acetaminophen  (PERCOCET/ROXICET) 5-325 MG tablet 1 tablet as needed Orally every 6 hrs     sildenafil  (REVATIO ) 20 MG tablet Take 3 pills daily as needed 60 tablet 2   triamcinolone  (NASACORT  AQ) 55 MCG/ACT AERO nasal inhaler Place 2 sprays into the nose daily. 1 Inhaler 12   No current facility-administered medications on file prior to visit.

## 2024-03-14 NOTE — Progress Notes (Signed)
 "        I, Leotis Batter, CMA acting as a scribe for Artist Lloyd, MD.  Phillip [name] is a 79 y.o. male who presents to [facility] today for R shoulder pain x 1 month. Pt locates pain to posterior aspect of the shoulder. Feels tight and throbbing pain.   Radiates: denies radiating pain. Aggravates: overhead reaching, golfing Treatments tried: TENS,   Pertinent review of systems: No fevers or chills  Relevant historical information: Heart disease   Exam:  BP 130/70   Pulse (!) 56   Ht 5' 10 (1.778 m)   Wt 163 lb (73.9 kg)   SpO2 97%   BMI 23.39 kg/m  General: Well Developed, well nourished, and in no acute distress.   MSK: Right shoulder no bruising normal-appearing otherwise.  Decreased range of motion pain with abduction.  Strength reduced abduction.  Pulses cap refill and sensation are intact distally.    Lab and Radiology Results  Procedure: Real-time Ultrasound Guided Injection of right shoulder glenohumeral joint posterior approach Device: Philips Affiniti 50G/GE Logiq Images permanently stored and available for review in PACS Ultrasound evaluation prior to injection reveals absent supraspinatus tendon concerning for a full-thickness retracted tear. Verbal informed consent obtained.  Discussed risks and benefits of procedure. Warned about infection, bleeding, hyperglycemia damage to structures among others. Patient expresses understanding and agreement Time-out conducted.   Noted no overlying erythema, induration, or other signs of local infection.   Skin prepped in a sterile fashion.   Local anesthesia: Topical Ethyl chloride.   With sterile technique and under real time ultrasound guidance: 40 mg of Kenalog  and 2 mL of Marcaine  injected into glenohumeral joint posterior approach. Fluid seen entering the glenohumeral joint.   Completed without difficulty   Pain immediately resolved suggesting accurate placement of the medication.    Advised to call if fevers/chills, erythema, induration, drainage, or persistent bleeding.   Images permanently stored and available for review in the ultrasound unit.  Impression: Technically successful ultrasound guided injection.    X-ray images right shoulder obtained today personally and independently interpreted. No acute fractures.  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  No severe glenohumeral DJD. Await formal radiology review     Assessment and Plan: 79 y.o. male with right shoulder pain worsening after a fall.  Ultrasound and physical exam show full-thickness retracted tear of the supraspinatus tendon.  It is hard to know how old this is.  He is not interested in surgery and has had a similar problem on the contralateral left shoulder in 2017.  Plan for glenohumeral injection and physical therapy referral.   PDMP not reviewed this encounter. Orders Placed This Encounter  Procedures   US  LIMITED JOINT SPACE STRUCTURES UP RIGHT(NO LINKED CHARGES)    Reason for Exam (SYMPTOM  OR DIAGNOSIS REQUIRED):   right shoulder pain    Preferred imaging location?:   [facility] Shoulder Right    Standing Status:   Future    Number of Occurrences:   1    Expiration Date:   03/14/2025    Reason for Exam (SYMPTOM  OR DIAGNOSIS REQUIRED):   right shoulder pain    Preferred imaging location?:   [facility]   Ambulatory referral to Physical Therapy    Referral Priority:   Routine    Referral Type:   Physical Medicine    Referral Reason:   Specialty Services Required  Requested Specialty:   Physical Therapy    Number of Visits Requested:   1   No orders of the defined types were placed in this encounter.    Discussed warning signs or symptoms. Please see discharge instructions. Patient expresses understanding.   The above documentation has been reviewed and is accurate and complete Artist Lloyd, M.D.   "

## 2024-03-14 NOTE — Patient Instructions (Signed)
 Please continue all other medications as before, and refills have been done if requested.  Please have the pharmacy call with any other refills you may need.  Please continue your efforts at being more active, low cholesterol diet, and weight control.  Please keep your appointments with your specialists as you may have planned  You will be contacted regarding the referral for: Sports Medicine  Please go to the LAB at the blood drawing area for the tests to be done  You will be contacted by phone if any changes need to be made immediately.  Otherwise, you will receive a letter about your results with an explanation, but please check with MyChart first.  Please make an Appointment to return in 6 months, or sooner if needed

## 2024-03-14 NOTE — Patient Instructions (Addendum)
 Thank you for coming in today.   You received an injection today. Seek immediate medical attention if the joint becomes red, extremely painful, or is oozing fluid.   Please get an Xray today before you leave.  A referral for physical therapy has been submitted. A representative from the physical therapy office will contact you to coordinate scheduling after confirming your benefits with your insurance provider. If you do not hear from the physical therapy office within the next 1-2 weeks, please let us  know.   See you back as needed.

## 2024-03-15 ENCOUNTER — Encounter: Payer: Self-pay | Admitting: Internal Medicine

## 2024-03-15 NOTE — Assessment & Plan Note (Signed)
 Lab Results  Component Value Date   LDLCALC 44 03/14/2024   Stable, pt to continue current statin crestor  20 mg qd

## 2024-03-15 NOTE — Assessment & Plan Note (Signed)
 Last vitamin D  Lab Results  Component Value Date   VD25OH 62.69 03/14/2024   Stable, cont oral replacement

## 2024-03-15 NOTE — Assessment & Plan Note (Signed)
 No overt bleeding - for f//u iron ferritin with labs

## 2024-03-15 NOTE — Assessment & Plan Note (Signed)
 BP Readings from Last 3 Encounters:  03/14/24 130/70  03/14/24 122/78  11/23/23 (!) 130/58   Stable, pt to continue medical treatment norvasc  5 every day, coreg  12.5 bid, altace  5 qd

## 2024-03-15 NOTE — Assessment & Plan Note (Signed)
 Lab Results  Component Value Date   HGBA1C 8.0 (H) 03/14/2024   uncontrolled, pt to continue current medical treatment farxiga  10 every day, and increased metformin  ER 500 mg to 3 qd

## 2024-03-15 NOTE — Assessment & Plan Note (Signed)
 Exam c/w likely rot cuff disorder - for sport medicine referral

## 2024-03-20 ENCOUNTER — Ambulatory Visit: Payer: Self-pay | Admitting: Family Medicine

## 2024-03-20 NOTE — Progress Notes (Signed)
 Right shoulder x-ray shows mild arthritis and some irritation where the rotator cuff tendon attaches to the shoulder bone.

## 2024-04-03 ENCOUNTER — Ambulatory Visit: Admitting: Family

## 2024-09-25 ENCOUNTER — Ambulatory Visit: Admitting: Family Medicine
